# Patient Record
Sex: Female | Born: 1955 | Race: White | Hispanic: No | Marital: Married | Smoking: Former smoker
Health system: Southern US, Community
[De-identification: ages and names within clinical notes are randomized; demographics above are authoritative.]

## PROBLEM LIST (undated history)

## (undated) DIAGNOSIS — E785 Hyperlipidemia, unspecified: Secondary | ICD-10-CM

## (undated) DIAGNOSIS — I639 Cerebral infarction, unspecified: Secondary | ICD-10-CM

## (undated) DIAGNOSIS — I10 Essential (primary) hypertension: Secondary | ICD-10-CM

## (undated) DIAGNOSIS — F329 Major depressive disorder, single episode, unspecified: Secondary | ICD-10-CM

## (undated) DIAGNOSIS — F419 Anxiety disorder, unspecified: Secondary | ICD-10-CM

## (undated) DIAGNOSIS — E119 Type 2 diabetes mellitus without complications: Secondary | ICD-10-CM

## (undated) DIAGNOSIS — N3090 Cystitis, unspecified without hematuria: Secondary | ICD-10-CM

## (undated) DIAGNOSIS — G709 Myoneural disorder, unspecified: Secondary | ICD-10-CM

## (undated) DIAGNOSIS — K219 Gastro-esophageal reflux disease without esophagitis: Secondary | ICD-10-CM

## (undated) DIAGNOSIS — F32A Depression, unspecified: Secondary | ICD-10-CM

## (undated) HISTORY — PX: CARPAL TUNNEL RELEASE: SHX101

---

## 1997-10-28 ENCOUNTER — Encounter: Admission: RE | Admit: 1997-10-28 | Discharge: 1998-01-26 | Payer: Self-pay | Admitting: *Deleted

## 2004-08-19 ENCOUNTER — Ambulatory Visit (HOSPITAL_BASED_OUTPATIENT_CLINIC_OR_DEPARTMENT_OTHER): Admission: RE | Admit: 2004-08-19 | Discharge: 2004-08-19 | Payer: Self-pay | Admitting: Orthopedic Surgery

## 2004-08-19 ENCOUNTER — Ambulatory Visit (HOSPITAL_COMMUNITY): Admission: RE | Admit: 2004-08-19 | Discharge: 2004-08-19 | Payer: Self-pay | Admitting: Orthopedic Surgery

## 2005-06-02 ENCOUNTER — Ambulatory Visit: Payer: Self-pay | Admitting: Cardiology

## 2008-05-25 ENCOUNTER — Inpatient Hospital Stay (HOSPITAL_COMMUNITY): Admission: AD | Admit: 2008-05-25 | Discharge: 2008-05-27 | Payer: Self-pay | Admitting: Internal Medicine

## 2008-05-26 ENCOUNTER — Encounter (INDEPENDENT_AMBULATORY_CARE_PROVIDER_SITE_OTHER): Payer: Self-pay | Admitting: Internal Medicine

## 2010-10-18 NOTE — Discharge Summary (Signed)
NAME:  Chloe Owens, Chloe Owens NO.:  [id number]   MEDICAL RECORD NO.:  [id number]          PATIENT TYPE:  INP   LOCATION:  3002                         FACILITY:  [facility]   PHYSICIAN:  Renee Ramus, MD       DATE OF BIRTH:  Oct 28, 1955   DATE OF ADMISSION:  05/25/2008  DATE OF DISCHARGE:  05/27/2008                               DISCHARGE SUMMARY   PRIMARY DISCHARGE DIAGNOSIS:  Acute cerebrovascular accident.   SECONDARY DIAGNOSES:  1. Diabetes mellitus type 1.  2. Vitamin B12 deficiency mild.  3. Hypercholesterolemia.  4. Altered mental status.  5. History of anxiety.  6. Hormone replacement therapy.  7. Tobacco abuse.  8. Hypertension.   HOSPITAL COURSE BY PROBLEM:  1. Acute cerebrovascular accident.  The patient is a 55 year old      female who was admitted secondary to decreased mental status,      evidence of expressive aphasia, evidence of mild gait instability      and acute cerebrovascular accident found on MRI at outside      hospital.  The patient was transferred to our facility for further      evaluation and treatment.  The patient did receive carotid      ultrasound showing no significant stenoses.  She did have an MRA      done at an outside facility showing relatively narrow posterior      vertebral arteries and evidence of plaque formation without      significant stenosis in the carotids.  The patient has been started      on aspirin.  She has also received a statin therapy.  Her      lisinopril has been increased.  She has been counseled to quit      smoking and drinking and her psychotropic medications, i.e. Ativan      and alprazolam had been discontinued.  The patient has made already      indications of good strong recovery.  She will follow up with      Speech Therapy as an outpatient.  She did receive a Speech Therapy      consult while in-house and they believe that patient was      demonstrating expressive aphasia with word-finding deficits  and      some cognitive impairment.  I am hopeful that B12 supplementation,      further therapy and modified risk factors will lead to a successful      conclusion.  2. Diabetes mellitus type 1.  The patient did have an elevated      hemoglobin A1c at 9.2.  She is on an outpatient regimen of NPH and      lispro.  This will be continued post-discharge.  She has been under      200-300 range during her hospitalization and I believe that she      could improve on her efficacy of treatment but will defer this to      her primary care physician.  3. Hypercholesterolemia.  The patient did have elevated cholesterol  and has been started on statin therapy.  She should follow up with      her primary care physician within 1 month to check for liver      enzymes and possible negative sequelae from statin therapy.  4. History of anxiety.  The patient will be continued on her Effexor      and further treatment per primary care physician.  5. Hypertension.  We are increasing her lisinopril to 20 mg p.o.      daily.  The patient has been on the 120s to 160s systolic blood      pressure.  Her goal systolic blood pressure should be less than      120.  6. Hormone-replacement therapy.  We have discontinued her Premarin.  7. Tobacco use.  She has discontinued smoking and has not required      patch and has not requested one during this hospitalization.   LABORATORY DATA:  1. Mild hypoxemia with a PO2 of 76, 95% on FIO2 of 0.21.  2. Hemoglobin A1c of 9.8.  3. UA showing elevated specific gravity at 1.037 with glucosuria and      ketones but no evidence of infection.  4. Tox screen positive for marijuana.  5. Homocystine level of 11.3.  6. Total cholesterol of 266 with HDL of 47, triglycerides 292 with an      LDL of 161.   STUDIES:  1. Carotid ultrasound showing no significant hemodynamic stenoses.  2. Two-D echocardiogram which is pending.  3. MRI done from outside hospital showing thalamic  infarct and diffuse      cortical atrophy.  4. 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   MEDICATIONS ON DISCHARGE:  1. Lispro and NPH as per home regimen.  2. Lisinopril 20 mg p.o. daily.  3. Aspirin 81 mg p.o. daily.  4. Zocor 10 mg p.o. daily.  5. Venlafaxine 75 mg p.o. daily.  6. B12 1000 mcg p.o. daily.   There are no labs or studies pending with the exception of the  echocardiogram prior to discharge.  The patient is in stable condition  and anxious for discharge.   Time spent 35 minutes.      Renee Ramus, MD  Electronically Signed     JF/MEDQ  D:  05/27/2008  T:  05/27/2008  Job:  [id number]

## 2010-10-18 NOTE — H&P (Signed)
NAME:  Chloe Chloe Owens, Chloe Owens NO.:  [id number]   MEDICAL RECORD NO.:  [id number]          PATIENT TYPE:  INP   LOCATION:  3002                         FACILITY:  [facility]   PHYSICIAN:  Lonia Blood, M.D.DATE OF BIRTH:  February 21, 1956   DATE OF ADMISSION:  05/25/2008  DATE OF DISCHARGE:                              HISTORY & PHYSICAL   PRIMARY CARE PHYSICIAN:  Unassigned in [place].   CHIEF COMPLAINT:  Subacute CVA with altered mental status.   HISTORY OF PRESENT ILLNESS:  Ms. Chloe Chloe Owens Chloe Owens. Chloe Chloe Owens Chloe Owens is a 55 year old female  who lives in the [place] area.  She was admitted to [facility] on  May 25, 1999, for acute onset of severe confusion.  A CT scan of  the head at the time of her admission there was unrevealing.  After  being placed in the hospital an MRI was accomplished which revealed an  acute left thalamic CVA.  Other laboratory tests were evaluated and  ongoing medical treatment was being provided.  During the patient's  hospital stay her confusion seemed to worsen.  When informed that a  neurology service would not be available, the patient's family requested  that the patient be transferred to [facility].  I was then  contacted by the attending physician, at which time the family's request  with relayed to me.  I agreed to accept the patient in transfer.   At the time of my evaluation, Ms. Chloe Chloe Owens Chloe Owens is in a hospital bed on unit  3000.  She is awake, but she is clearly confused.  She asked the same  question over and over.  She is not able to tell me what year it is or  exactly where she is.  She has no understanding of the circumstances of  her transfer here and no awareness of her own sickness.  The patient's  husband and son are in the room.  They state that in retrospect they  have noted very subtle changes in the patient's mental status with  progressive confusion and occasional questions that did make sense over  about a 6-8 week period.  This has  not prevented her, however, from  continuing dysfunction as a Architectural technologist and has been very subtle  though admittedly progressive.  Nonetheless, there was a significant  rapid change yesterday morning.  The patient's son stated that she was  still in bed when he called to check on her.  She made no sense in the  conversation they carried out over the phone.  As noted, this was a  dramatic difference.  This prompted her initial evaluation at [facility].  Since that time, the patient has shown no significant signs of  improvement.   REVIEW OF SYSTEMS:  The patient herself cannot provide a review of  systems at the present time as she is significantly altered in her  mental status.   PAST MEDICAL HISTORY:  1. Diabetes mellitus.  2. Hypertension.  3. Hypercholesterolemia.  4. Anxiety disorder.  5. Status post carpal tunnel release on the left.  6. Tobacco abuse in the amount  of one pack per day since teen years.   OUTPATIENT MEDICATIONS:  1. Prempro daily.  2. Effexor 75 mg daily.  3. Ambien 10 mg nightly.  4. Prilosec 20 mg daily.  5. Xanax 1 mg daily.  6. Lisinopril - dose unclear.  7. Humulin - dose unclear.   ALLERGIES:  CODEINE.   FAMILY HISTORY:  Unable to be gleaned from the patient.   SOCIAL HISTORY:  The patient does not drink.  She does not use illicit  drugs.  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  She lives in the [place] area.   LABORATORY DATA:  (All labs for [facility]).  CMET is remarkable  for a serum glucose of 478, alk phos of 113 and an albumin 3.4 with a  total protein of 6.7.  White count is elevated at 11.0 with a normal  hemoglobin and normal platelet count.  B12 level is 254, which is the  extreme low end of normal with a normal folic acid.  Urinalysis reveals  greater than 1000 glucose, 15 of ketones and 5-10 white blood cells.  CT  scan of the head from May 24, 2008, reveals  mild diffuse cortical  atrophy.  The MRI of the head from May 25, 2008, reveals an acute  left thalamic CVA.   PHYSICAL EXAMINATION:  VITAL SIGNS:  Temperature 97.8, blood pressure  132/74, heart rate 93, respiratory 16.  O2 sats 95% on room air.  GENERAL:  A well-developed, well-nourished female in no acute  respiratory distress.  HEENT:  Normocephalic, atraumatic.  Pupils equal, round and reactive to  light and accommodation.  Extraocular muscles intact bilaterally.  OC/OP  clear.  NECK:  No JVD.  LUNGS:  Clear to auscultation bilaterally without wheezes or rhonchi.  CARDIOVASCULAR:  Regular rate and rhythm with a subtle 2/6 holosystolic  murmur without gallop or rub.  ABDOMEN:  Nontender, nondistended.  Soft.  Bowel sounds present.  No  organomegaly, no rebound, no ascites.  EXTREMITIES:  No significant cyanosis, clubbing or edema bilateral lower  extremities.  NEUROLOGIC:  The patient displays 5/5 strength in bilateral upper and  lower extremities.  There is no Babinski.  She has intact sensation to  touch throughout.  Cranial nerves II through XII appear to be intact  bilaterally.  The patient is, however, significantly confused.  As noted  above, she is alert to place, but not year or situation.  She continues  to ask the same question over-and-over within a 10-minute period.   IMPRESSION AND PLAN:  1. Severe altered mental status - there appears to be both an acute      and a subacute/chronic component to this altered mental status.  As      noted above, this has been a very subtle issue previously which      rapidly progressed on May 24, 2008.  It is my hope that what      we are seeing is a consequence of a significant B12 deficiency.  A      B12 level of 254 certainly could cause significant clinical      symptoms.  I will send urine for a methylmalonic acid assay to help      confirm my suspicions.  Regardless, we will initiate B12      supplementation at 1000  mcg subcu daily and hope to see significant      improvement.  There is likely a multifactorial component hear as      well given the patient's pyelonephritis, as well as her thalamic      cerebrovascular accident.  I have warned the family that though I      am hopeful that B12 will help, but it may not be the answer to all      of our problems.  We will treat the pyelonephritis and send urine      for culture.  We will complete the workup for her thalamic      cerebrovascular accident.  2. Acute left thalamic cerebrovascular accident - at the present time      the patient is days away from the onset of her symptoms and is      therefore not a candidate for aggressive acute intervention.  We      will, however, complete a full workup as the patient has not yet      had Doppler studies nor an MRA.  It that is not clear whether or      not the patient was taking an aspirin every day, I will empirically      add Plavix to her regimen for the present time.  A neurology      consultation will be considered based upon the results of our      pending testing.  3. Pyelonephritis - the patient has an elevated white count,      significant altered mental status and a positive urinalysis.  She      was recently treated in the outpatient setting with ciprofloxacin.      It is quite possible this patient has a Cipro resistant offending      bacterial pathogen.  I will therefore treat the patient with      Rocephin and we will send urine for culture for further evaluation.  4. Diabetes mellitus - we will place the patient on sliding scale      insulin and follow her CBGs very closely.  5. Hypertension - we will continue lisinopril and follow her blood      pressure trend closely.  6. Hypercholesterolemia - we will check a fasting lipid panel in the      morning and consider initiation of Lipitor as indicated.  Liver      function tests have been confirmed to be normal.  7. Anxiety disorder - we must  consider the possibility of a      benzodiazepine withdrawal given the fact the patient is chronically      on Xanax.  It sounds, however, the patient has been consistently      taking her Xanax and that she does not abuse it a dose higher than      that prescribed.  For now, we will half her dose and provide it on      a b.i.d. basis to avoid further withdrawal symptoms.  We will      follow her closely.  8. Tobacco abuse - at the present time, it would be fruitless to      educate the patient on the need for discontinuation of tobacco      abuse.  We will follow her clinically and hope that her mental      status improves at which time we will address this with her.      Lonia Blood, M.D.  Electronically Signed     JTM/MEDQ  D:  05/25/2008  T:  05/25/2008  Job:  [id number]

## 2010-10-21 NOTE — Op Note (Signed)
NAME:  Chloe Owens, Chloe Owens                 ACCOUNT NO.:  [id number]   MEDICAL RECORD NO.:  [id number]          PATIENT TYPE:  AMB   LOCATION:  DSC                          FACILITY:  [facility]   PHYSICIAN:  Katy Fitch. Sypher Montez Hageman., M.D.DATE OF BIRTH:  [birth year]   DATE OF PROCEDURE:  08/19/2004  DATE OF DISCHARGE:                                 OPERATIVE REPORT   PREOPERATIVE DIAGNOSIS:  Chronic entrapped neuropathy, median nerve right  carpal tunnel, and left carpal tunnel.   POSTOPERATIVE DIAGNOSIS:  Chronic entrapped neuropathy, median nerve right  carpal tunnel, and left carpal tunnel.   OPERATION:  Release of right transverse carpal ligament and injection of  left ulnar bursa.   OPERATING SURGEON:  Katy Fitch. Sypher, M.D.   ASSESSMENT:  Marveen Reeks. Dasnoit, P.A.-C.   ANESTHESIA:  General by LMA.   SUPERVISING ANESTHESIOLOGIST:  Janetta Hora. Gelene Mink, M.D.   INDICATIONS:  Chloe Owens is a 55 year old woman referred for evaluation and  management of bilateral hand numbness and discomfort. She has insulin-  dependent diabetes and background coronary artery disease.   Clinical examination confirmed signs of median neuropathy and elective  diagnostic studies revealed bilateral median neuropathy at the level of the  transverse carpal ligament.   After informed consent, we have advised proceeding with release of the right  transverse carpal ligament at this time.   Preoperative lab revealed an abnormal EKG. She has been thoroughly evaluated  by her medical physicians and cleared for surgery. She has had a stress  Cardiolite study performed prior that revealed an ejection fraction of 71%.   After informed consent, she is brought to the operating at this time.   PROCEDURE:  Shelbee Apgar is brought to the operating room and placed in the  supine position on the operating table. Following induction of general  anesthesia by LMA under the direct supervision Dr. Gelene Mink, the right arm  was  prepped with Betadine soap solution and sterilely draped.   Following exsanguination of the right arm with an Esmarch bandage, an  arterial tourniquet on the proximal brachium was inflated to 220 mmHg. This  procedure commenced with a short incision in the line of the ring finger in  the palm.  Subcutaneous tissues were carefully divided revealing the palmar  fascia. This was split in line of its fibers to reveal the common sensory  branch of the median nerve and superficial palmar arch. The median nerve was  gently isolated from the transverse carpal ligament with a Penfield 4  elevator followed by release of the transverse carpal ligament along its  ulnar border with scissors extending into the distal forearm.   This widely opened the carpal canal. No masses or other predicaments were  noted.   Bleeding points along the margin of the released ligament were  electrocauterized with bipolar current followed by repair of the skin with  intradermal 3-0 Prolene.   Attention was then directed to the left wrist. After routine Betadine prep,  a mixture of Depo-Medrol 40 mg/ml and 1% lidocaine was infiltrated into the  ulnar bursa taking care to  avoid the position of the median nerve.   This was dressed with a Band-Aid.   The right hand was dressed with Xeroflo, sterile gauze, and a volar plaster  splint maintaining 5 degrees of dorsiflexion.   Note, for aftercare, she is given a prescription for Vicodin 5 mg one p.o.  q.4-6h. pain, #20 tablets without refill.   She will return to our office for follow-up in one week or sooner if any  problems.      RVS/MEDQ  D:  08/19/2004  T:  08/19/2004  Job:  [id number]

## 2011-03-10 LAB — BLOOD GAS, ARTERIAL
Acid-Base Excess: 0.2 mmol/L (ref 0.0–2.0)
Bicarbonate: 24.3 mEq/L — ABNORMAL HIGH (ref 20.0–24.0)
Drawn by: 249101
FIO2: 0.21 %
O2 Saturation: 95.6 %
Patient temperature: 98.6
TCO2: 25.6 mmol/L (ref 0–100)
pCO2 arterial: 39.4 mmHg (ref 35.0–45.0)
pH, Arterial: 7.408 — ABNORMAL HIGH (ref 7.350–7.400)
pO2, Arterial: 76 mmHg — ABNORMAL LOW (ref 80.0–100.0)

## 2011-03-10 LAB — URINE CULTURE
Colony Count: 80000
Special Requests: NEGATIVE

## 2011-03-10 LAB — METHYLMALONIC ACID(MMA), RND URINE
Creatinine, Urine mg/dL-MMAURN: 99 mg/dL
Methylmalonic Acid, Random Urine: 1.1 mmol/mol CRT (ref 0.0–3.6)
Methylmalonic Acid, Ur: 9.6 umol/L

## 2011-03-10 LAB — URINE DRUGS OF ABUSE SCREEN W ALC, ROUTINE (REF LAB)
Amphetamine Screen, Ur: NEGATIVE
Barbiturate Quant, Ur: NEGATIVE
Benzodiazepines.: NEGATIVE
Cocaine Metabolites: NEGATIVE
Creatinine,U: 105 mg/dL
Ethyl Alcohol: <10 mg/dL (ref <10)
Marijuana Metabolite: POSITIVE — AB
Methadone: NEGATIVE
Opiate Screen, Urine: NEGATIVE
Phencyclidine (PCP): NEGATIVE
Propoxyphene: NEGATIVE

## 2011-03-10 LAB — PROTIME-INR
INR: 1 (ref 0.00–1.49)
Prothrombin Time: 13.1 seconds (ref 11.6–15.2)

## 2011-03-10 LAB — LIPID PANEL
Cholesterol: 266 mg/dL — ABNORMAL HIGH (ref 0–200)
HDL: 47 mg/dL (ref >39)
LDL Cholesterol: 161 mg/dL — ABNORMAL HIGH (ref 0–99)
Total CHOL/HDL Ratio: 5.7 RATIO
Triglycerides: 292 mg/dL — ABNORMAL HIGH (ref <150)
VLDL: 58 mg/dL — ABNORMAL HIGH (ref 0–40)

## 2011-03-10 LAB — URINE MICROSCOPIC-ADD ON

## 2011-03-10 LAB — URINALYSIS, ROUTINE W REFLEX MICROSCOPIC
Glucose, UA: >1000 mg/dL — AB
Hgb urine dipstick: NEGATIVE
Ketones, ur: >80 mg/dL — AB
Leukocytes, UA: NEGATIVE
Nitrite: NEGATIVE
Protein, ur: NEGATIVE mg/dL
Specific Gravity, Urine: 1.037 — ABNORMAL HIGH (ref 1.005–1.030)
Urobilinogen, UA: 0.2 mg/dL (ref 0.0–1.0)
pH: 5.5 (ref 5.0–8.0)

## 2011-03-10 LAB — GLUCOSE, CAPILLARY
Glucose-Capillary: 188 mg/dL — ABNORMAL HIGH (ref 70–99)
Glucose-Capillary: 192 mg/dL — ABNORMAL HIGH (ref 70–99)
Glucose-Capillary: 226 mg/dL — ABNORMAL HIGH (ref 70–99)
Glucose-Capillary: 230 mg/dL — ABNORMAL HIGH (ref 70–99)
Glucose-Capillary: 294 mg/dL — ABNORMAL HIGH (ref 70–99)
Glucose-Capillary: 337 mg/dL — ABNORMAL HIGH (ref 70–99)
Glucose-Capillary: 387 mg/dL — ABNORMAL HIGH (ref 70–99)
Glucose-Capillary: 487 mg/dL — ABNORMAL HIGH (ref 70–99)

## 2011-03-10 LAB — HEMOGLOBIN A1C
Hgb A1c MFr Bld: 9.8 % — ABNORMAL HIGH (ref 4.6–6.1)
Mean Plasma Glucose: 235 mg/dL

## 2011-03-10 LAB — COMPREHENSIVE METABOLIC PANEL
ALT: 11 U/L (ref 0–35)
AST: 14 U/L (ref 0–37)
Albumin: 2.8 g/dL — ABNORMAL LOW (ref 3.5–5.2)
Alkaline Phosphatase: 89 U/L (ref 39–117)
BUN: 14 mg/dL (ref 6–23)
CO2: 28 mEq/L (ref 19–32)
Calcium: 8.6 mg/dL (ref 8.4–10.5)
Chloride: 105 mEq/L (ref 96–112)
Creatinine, Ser: 0.7 mg/dL (ref 0.4–1.2)
GFR calc Af Amer: >60 mL/min (ref >60)
GFR calc non Af Amer: >60 mL/min (ref >60)
Glucose, Bld: 230 mg/dL — ABNORMAL HIGH (ref 70–99)
Potassium: 3.6 mEq/L (ref 3.5–5.1)
Sodium: 140 mEq/L (ref 135–145)
Total Bilirubin: 0.9 mg/dL (ref 0.3–1.2)
Total Protein: 5.7 g/dL — ABNORMAL LOW (ref 6.0–8.3)

## 2011-03-10 LAB — CBC
HCT: 37.6 % (ref 36.0–46.0)
Hemoglobin: 12.7 g/dL (ref 12.0–15.0)
MCHC: 33.7 g/dL (ref 30.0–36.0)
MCV: 88.5 fL (ref 78.0–100.0)
Platelets: 319 10*3/uL (ref 150–400)
RBC: 4.25 MIL/uL (ref 3.87–5.11)
RDW: 13.6 % (ref 11.5–15.5)
WBC: 7.4 10*3/uL (ref 4.0–10.5)

## 2011-03-10 LAB — CARDIAC PANEL(CRET KIN+CKTOT+MB+TROPI)
CK, MB: 1 ng/mL (ref 0.3–4.0)
Relative Index: INVALID (ref 0.0–2.5)
Total CK: 32 U/L (ref 7–177)
Troponin I: 0.01 ng/mL (ref 0.00–0.06)

## 2011-03-10 LAB — APTT: aPTT: 24 seconds (ref 24–37)

## 2011-03-10 LAB — TSH: TSH: 0.416 u[IU]/mL (ref 0.350–4.500)

## 2011-03-10 LAB — VITAMIN B12: Vitamin B-12: >2000 pg/mL — ABNORMAL HIGH (ref 211–911)

## 2011-03-10 LAB — PREGNANCY, URINE: Preg Test, Ur: NEGATIVE

## 2011-03-10 LAB — FOLATE: Folate: 7.4 ng/mL

## 2011-03-10 LAB — THC (MARIJUANA), URINE, CONFIRMATION: Marijuana, Ur-Confirmation: 80 ng/mL

## 2011-03-10 LAB — RPR: RPR Ser Ql: NONREACTIVE

## 2011-03-10 LAB — HOMOCYSTEINE: Homocysteine: 11.3 umol/L (ref 4.0–15.4)

## 2011-10-12 DIAGNOSIS — Z1231 Encounter for screening mammogram for malignant neoplasm of breast: Secondary | ICD-10-CM | POA: Diagnosis not present

## 2011-11-13 DIAGNOSIS — B850 Pediculosis due to Pediculus humanus capitis: Secondary | ICD-10-CM | POA: Diagnosis not present

## 2012-04-14 ENCOUNTER — Emergency Department (HOSPITAL_COMMUNITY): Payer: BC Managed Care – PPO

## 2012-04-14 ENCOUNTER — Encounter (HOSPITAL_COMMUNITY): Payer: Self-pay

## 2012-04-14 ENCOUNTER — Observation Stay (HOSPITAL_COMMUNITY)
Admission: EM | Admit: 2012-04-14 | Discharge: 2012-04-17 | DRG: 253 | Disposition: A | Discharge status: Skilled Nursing Facility | Payer: BC Managed Care – PPO | Attending: Emergency Medicine | Admitting: Emergency Medicine

## 2012-04-14 DIAGNOSIS — S82892A Other fracture of left lower leg, initial encounter for closed fracture: Secondary | ICD-10-CM | POA: Diagnosis present

## 2012-04-14 DIAGNOSIS — E785 Hyperlipidemia, unspecified: Secondary | ICD-10-CM | POA: Diagnosis present

## 2012-04-14 DIAGNOSIS — S82843A Displaced bimalleolar fracture of unspecified lower leg, initial encounter for closed fracture: Secondary | ICD-10-CM | POA: Diagnosis not present

## 2012-04-14 DIAGNOSIS — S99919A Unspecified injury of unspecified ankle, initial encounter: Secondary | ICD-10-CM | POA: Diagnosis not present

## 2012-04-14 DIAGNOSIS — S8990XA Unspecified injury of unspecified lower leg, initial encounter: Secondary | ICD-10-CM | POA: Diagnosis not present

## 2012-04-14 DIAGNOSIS — Z7982 Long term (current) use of aspirin: Secondary | ICD-10-CM | POA: Diagnosis not present

## 2012-04-14 DIAGNOSIS — Z794 Long term (current) use of insulin: Secondary | ICD-10-CM | POA: Insufficient documentation

## 2012-04-14 DIAGNOSIS — E162 Hypoglycemia, unspecified: Secondary | ICD-10-CM | POA: Diagnosis not present

## 2012-04-14 DIAGNOSIS — E119 Type 2 diabetes mellitus without complications: Secondary | ICD-10-CM | POA: Diagnosis present

## 2012-04-14 DIAGNOSIS — S99929A Unspecified injury of unspecified foot, initial encounter: Secondary | ICD-10-CM | POA: Diagnosis not present

## 2012-04-14 DIAGNOSIS — I10 Essential (primary) hypertension: Secondary | ICD-10-CM | POA: Insufficient documentation

## 2012-04-14 DIAGNOSIS — S82899A Other fracture of unspecified lower leg, initial encounter for closed fracture: Secondary | ICD-10-CM

## 2012-04-14 DIAGNOSIS — I959 Hypotension, unspecified: Secondary | ICD-10-CM | POA: Diagnosis not present

## 2012-04-14 DIAGNOSIS — F17200 Nicotine dependence, unspecified, uncomplicated: Secondary | ICD-10-CM | POA: Diagnosis present

## 2012-04-14 DIAGNOSIS — Z880 Allergy status to penicillin: Secondary | ICD-10-CM | POA: Diagnosis not present

## 2012-04-14 DIAGNOSIS — S9306XA Dislocation of unspecified ankle joint, initial encounter: Secondary | ICD-10-CM | POA: Diagnosis not present

## 2012-04-14 DIAGNOSIS — Y92009 Unspecified place in unspecified non-institutional (private) residence as the place of occurrence of the external cause: Secondary | ICD-10-CM | POA: Insufficient documentation

## 2012-04-14 DIAGNOSIS — E1169 Type 2 diabetes mellitus with other specified complication: Secondary | ICD-10-CM | POA: Insufficient documentation

## 2012-04-14 DIAGNOSIS — Z8673 Personal history of transient ischemic attack (TIA), and cerebral infarction without residual deficits: Secondary | ICD-10-CM | POA: Diagnosis not present

## 2012-04-14 DIAGNOSIS — Z01818 Encounter for other preprocedural examination: Secondary | ICD-10-CM | POA: Diagnosis not present

## 2012-04-14 DIAGNOSIS — W010XXA Fall on same level from slipping, tripping and stumbling without subsequent striking against object, initial encounter: Secondary | ICD-10-CM | POA: Insufficient documentation

## 2012-04-14 DIAGNOSIS — Z79899 Other long term (current) drug therapy: Secondary | ICD-10-CM | POA: Diagnosis not present

## 2012-04-14 DIAGNOSIS — Z4789 Encounter for other orthopedic aftercare: Secondary | ICD-10-CM | POA: Diagnosis not present

## 2012-04-14 HISTORY — DX: Hyperlipidemia, unspecified: E78.5

## 2012-04-14 HISTORY — DX: Type 2 diabetes mellitus without complications: E11.9

## 2012-04-14 HISTORY — DX: Cerebral infarction, unspecified: I63.9

## 2012-04-14 HISTORY — DX: Essential (primary) hypertension: I10

## 2012-04-14 LAB — COMPREHENSIVE METABOLIC PANEL
ALT: 6 U/L (ref 0–35)
AST: 13 U/L (ref 0–37)
Albumin: 2.9 g/dL — ABNORMAL LOW (ref 3.5–5.2)
Alkaline Phosphatase: 53 U/L (ref 39–117)
BUN: 24 mg/dL — ABNORMAL HIGH (ref 6–23)
CO2: 32 mEq/L (ref 19–32)
Calcium: 8.6 mg/dL (ref 8.4–10.5)
Chloride: 102 mEq/L (ref 96–112)
Creatinine, Ser: 0.97 mg/dL (ref 0.50–1.10)
GFR calc Af Amer: 73 mL/min — ABNORMAL LOW (ref >90)
GFR calc non Af Amer: 63 mL/min — ABNORMAL LOW (ref >90)
Glucose, Bld: 65 mg/dL — ABNORMAL LOW (ref 70–99)
Potassium: 3.9 mEq/L (ref 3.5–5.1)
Sodium: 140 mEq/L (ref 135–145)
Total Bilirubin: 0.2 mg/dL — ABNORMAL LOW (ref 0.3–1.2)
Total Protein: 6.3 g/dL (ref 6.0–8.3)

## 2012-04-14 LAB — CBC WITH DIFFERENTIAL/PLATELET
Band Neutrophils: 0 % (ref 0–10)
Basophils Absolute: 0 10*3/uL (ref 0.0–0.1)
Basophils Relative: 0 % (ref 0–1)
Blasts: 0 %
Eosinophils Absolute: 0.3 10*3/uL (ref 0.0–0.7)
Eosinophils Relative: 2 % (ref 0–5)
HCT: 35.3 % — ABNORMAL LOW (ref 36.0–46.0)
Hemoglobin: 12.2 g/dL (ref 12.0–15.0)
Lymphocytes Relative: 40 % (ref 12–46)
Lymphs Abs: 5 10*3/uL — ABNORMAL HIGH (ref 0.7–4.0)
MCH: 30.7 pg (ref 26.0–34.0)
MCHC: 34.6 g/dL (ref 30.0–36.0)
MCV: 88.7 fL (ref 78.0–100.0)
Metamyelocytes Relative: 0 %
Monocytes Absolute: 0.6 10*3/uL (ref 0.1–1.0)
Monocytes Relative: 5 % (ref 3–12)
Myelocytes: 0 %
Neutro Abs: 6.7 10*3/uL (ref 1.7–7.7)
Neutrophils Relative %: 53 % (ref 43–77)
Platelets: 189 10*3/uL (ref 150–400)
Promyelocytes Absolute: 0 %
RBC: 3.98 MIL/uL (ref 3.87–5.11)
RDW: 13.2 % (ref 11.5–15.5)
WBC: 12.6 10*3/uL — ABNORMAL HIGH (ref 4.0–10.5)
nRBC: 0 /100 WBC

## 2012-04-14 LAB — URINALYSIS, ROUTINE W REFLEX MICROSCOPIC
Bilirubin Urine: NEGATIVE
Glucose, UA: 250 mg/dL — AB
Hgb urine dipstick: NEGATIVE
Ketones, ur: NEGATIVE mg/dL
Leukocytes, UA: NEGATIVE
Nitrite: NEGATIVE
Protein, ur: NEGATIVE mg/dL
Specific Gravity, Urine: 1.018 (ref 1.005–1.030)
Urobilinogen, UA: 1 mg/dL (ref 0.0–1.0)
pH: 5.5 (ref 5.0–8.0)

## 2012-04-14 LAB — GLUCOSE, CAPILLARY
Glucose-Capillary: 131 mg/dL — ABNORMAL HIGH (ref 70–99)
Glucose-Capillary: 57 mg/dL — ABNORMAL LOW (ref 70–99)
Glucose-Capillary: 154 mg/dL — ABNORMAL HIGH (ref 70–99)
Glucose-Capillary: 48 mg/dL — ABNORMAL LOW (ref 70–99)
Glucose-Capillary: 153 mg/dL — ABNORMAL HIGH (ref 70–99)
Glucose-Capillary: 81 mg/dL (ref 70–99)

## 2012-04-14 LAB — LACTIC ACID, PLASMA: Lactic Acid, Venous: 1.4 mmol/L (ref 0.5–2.2)

## 2012-04-14 MED ORDER — ONDANSETRON 8 MG PO TBDP
8.0000 mg | ORAL_TABLET | Freq: Once | ORAL | Status: AC
  Administered 2012-04-14: 8 mg via ORAL
  Filled 2012-04-14 (×2): qty 1

## 2012-04-14 MED ORDER — DEXTROSE 50 % IV SOLN
1.0000 | Freq: Once | INTRAVENOUS | Status: AC
  Administered 2012-04-14: 50 mL via INTRAVENOUS

## 2012-04-14 MED ORDER — SIMVASTATIN 40 MG PO TABS
40.0000 mg | ORAL_TABLET | Freq: Every evening | ORAL | Status: DC
  Administered 2012-04-15 – 2012-04-16 (×2): 40 mg via ORAL
  Filled 2012-04-14 (×5): qty 1

## 2012-04-14 MED ORDER — MIDAZOLAM HCL 2 MG/2ML IJ SOLN
2.0000 mg | Freq: Once | INTRAMUSCULAR | Status: AC
  Administered 2012-04-14: 2 mg via INTRAVENOUS
  Filled 2012-04-14: qty 2

## 2012-04-14 MED ORDER — HYDROMORPHONE HCL PF 1 MG/ML IJ SOLN
1.0000 mg | Freq: Once | INTRAMUSCULAR | Status: AC
  Administered 2012-04-14: 1 mg via INTRAVENOUS
  Filled 2012-04-14: qty 1

## 2012-04-14 MED ORDER — HYDROMORPHONE HCL PF 1 MG/ML IJ SOLN
INTRAMUSCULAR | Status: AC
  Filled 2012-04-14: qty 1

## 2012-04-14 MED ORDER — HYDROMORPHONE HCL PF 1 MG/ML IJ SOLN: 1.0000 mg | Freq: Once | INTRAMUSCULAR | Status: DC

## 2012-04-14 MED ORDER — PROPOFOL 10 MG/ML IV EMUL
INTRAVENOUS | Status: AC
  Administered 2012-04-14: 15:00:00
  Filled 2012-04-14: qty 100

## 2012-04-14 MED ORDER — INSULIN GLARGINE 100 UNIT/ML ~~LOC~~ SOLN: 40.0000 [IU] | Freq: Every day | SUBCUTANEOUS | Status: DC

## 2012-04-14 MED ORDER — SODIUM CHLORIDE 0.9 % IV BOLUS (SEPSIS)
1000.0000 mL | Freq: Once | INTRAVENOUS | Status: AC
  Administered 2012-04-14: 1000 mL via INTRAVENOUS

## 2012-04-14 MED ORDER — INSULIN GLARGINE 100 UNIT/ML ~~LOC~~ SOLN: 20.0000 [IU] | Freq: Every day | SUBCUTANEOUS | Status: DC

## 2012-04-14 MED ORDER — DEXTROSE 50 % IV SOLN
INTRAVENOUS | Status: AC
  Administered 2012-04-14: 50 mL
  Filled 2012-04-14: qty 50

## 2012-04-14 MED ORDER — FENTANYL CITRATE 0.05 MG/ML IJ SOLN
50.0000 ug | Freq: Once | INTRAMUSCULAR | Status: AC
  Administered 2012-04-14: 50 ug via INTRAMUSCULAR
  Filled 2012-04-14: qty 2

## 2012-04-14 MED ORDER — INSULIN ASPART 100 UNIT/ML ~~LOC~~ SOLN: 0.0000 [IU] | SUBCUTANEOUS | Status: DC

## 2012-04-14 MED ORDER — DIVALPROEX SODIUM ER 500 MG PO TB24
500.0000 mg | ORAL_TABLET | Freq: Two times a day (BID) | ORAL | Status: DC
  Administered 2012-04-15 – 2012-04-17 (×6): 500 mg via ORAL
  Filled 2012-04-14 (×8): qty 1

## 2012-04-14 MED ORDER — ONDANSETRON HCL 4 MG/2ML IJ SOLN: 4.0000 mg | Freq: Three times a day (TID) | INTRAMUSCULAR | Status: AC | PRN

## 2012-04-14 MED ORDER — HYDROMORPHONE HCL PF 1 MG/ML IJ SOLN
INTRAMUSCULAR | Status: AC | PRN
  Administered 2012-04-14: 1 mg

## 2012-04-14 MED ORDER — DEXTROSE 50 % IV SOLN
INTRAVENOUS | Status: AC
  Administered 2012-04-14: 50 mL via INTRAVENOUS
  Filled 2012-04-14: qty 50

## 2012-04-14 MED ORDER — SODIUM CHLORIDE 0.9 % IV SOLN
INTRAVENOUS | Status: DC
  Administered 2012-04-14: 14:00:00 via INTRAVENOUS

## 2012-04-14 MED ORDER — DEXTROSE-NACL 5-0.9 % IV SOLN
INTRAVENOUS | Status: DC
  Administered 2012-04-15: 11:00:00 via INTRAVENOUS

## 2012-04-14 MED ORDER — ALPRAZOLAM 0.5 MG PO TABS
0.5000 mg | ORAL_TABLET | Freq: Two times a day (BID) | ORAL | Status: DC
  Administered 2012-04-15 – 2012-04-17 (×6): 0.5 mg via ORAL
  Filled 2012-04-14 (×6): qty 1

## 2012-04-14 MED ORDER — MORPHINE SULFATE 4 MG/ML IJ SOLN
4.0000 mg | INTRAMUSCULAR | Status: DC | PRN
  Administered 2012-04-14 – 2012-04-15 (×3): 4 mg via INTRAVENOUS
  Filled 2012-04-14 (×3): qty 1

## 2012-04-14 MED ORDER — PANTOPRAZOLE SODIUM 40 MG PO TBEC
40.0000 mg | DELAYED_RELEASE_TABLET | Freq: Every day | ORAL | Status: DC
Start: 2012-04-14 — End: 2012-04-17
  Administered 2012-04-15 – 2012-04-16 (×3): 40 mg via ORAL
  Filled 2012-04-14 (×3): qty 1

## 2012-04-14 NOTE — Progress Notes (Signed)
Orthopedic Tech Progress Note Patient Details:  Chloe Owens [facility] 1956-05-01 [id number]  Ortho Devices Type of Ortho Device: Stirrup splint Ortho Device/Splint Location: left leg Ortho Device/Splint Interventions: Application   Demontrae Gilbert 04/14/2012, 8:22 PM

## 2012-04-14 NOTE — Consult Note (Signed)
[facility] Medical Consultation  SHASHANA FULLINGTON ZOX:[id number] DOB: [birth year] DOA: 04/14/2012 PCP: No primary provider on file.   Requesting physician: Dr. Francena Hanly Date of consultation: 04/14/2012 Reason for consultation: Medical Management   Impression/Recommendations Principal Problem:  *Ankle fracture, left Active Problems:  Fall due to stumbling  Hypoglycemia  Hypotension  Diabetes mellitus  Hyperlipidemia  History of CVA (cerebrovascular accident)   Plan:    1.  Left Ankle Fx:  Ortho: Dr. Kirtland Bouchard.Supple Plan for surgery on 04/16/2011 2.  Hypoglycemia-Improved, Continue Diet until NPO for Surgery tomorrow's Lantus dosage reduced, SSI PRN.   3.  Hypotension- Hold BP Meds, IVFs for Fluid Resuscitation, Monitor Bps 4.  DM2-  Half Dose Lantus in AM, then reinstate Full does on 04/16/2012 if taking POs. 5.  Hyperlipidemia- continue Simvastatin 6.  CVA hx -stable 7.  Other- Request Medication Reconciliation per pharmacy at 9:10 pm, due to discrepancies of home medications listed.     Thank you for this consultation, [facility] will follow this patient while she is hospitalized.    Chief Complaint: Fall with Left Ankle Fracture  HPI: Chloe Owens is an 56 y.o. Female who tripped and fell turning her ankle this afternoon and was taken to [facility] ED.  She had X-rays which revealed a Fracture of the left Ankle and arrangements were made to transfer her to [facility] for Orthopedic Care under the services of Dr. Francena Hanly.  On arrival to [facility], the patient was found to be hypotensive and hypoglycemic so a Medicine Consultation was requested.  IVFs were administered and her blood pressures were responding, and she was given IV dextrose and had improvement in her blood sugar.  The hypotension was most likely due to the pain medications she had received, and her hypoglycemia was most likely due to being NPO and on Lantus therapy while she has been  awaiting possible eminent surgery.  Patient has been seen and evaluated by Ortho and has been placed in a splint /cast for now, and is to have surgery on 04/15/2012 at approximately 2:30 pm.  She will be able to eat now and will be NPO 8 hour befor surgery.     Review of Systems: The patient denies anorexia, fever, weight loss, vision loss, decreased hearing, hoarseness, chest pain, syncope, dyspnea on exertion, peripheral edema, balance deficits, hemoptysis, abdominal pain, melena, hematochezia, severe indigestion/heartburn, hematuria, incontinence, genital sores, muscle weakness, suspicious skin lesions, transient blindness, difficulty walking, depression, unusual weight change, abnormal bleeding, enlarged lymph nodes, angioedema, and breast masses.      Past Medical History  Diagnosis Date  . Diabetes mellitus without complication   . Hypertension   . Stroke     05/2008  . Hyperlipidemia     History reviewed. No pertinent past surgical history.    Prior to Admission medications   Medication Sig Start Date End Date Taking? Authorizing Provider  ALPRAZolam Prudy Feeler) 0.5 MG tablet Take 0.5 mg by mouth 2 (two) times daily.   Yes Historical Provider, MD  aspirin EC 81 MG tablet Take 81 mg by mouth daily.   Yes Historical Provider, MD  divalproex (DEPAKOTE ER) 500 MG 24 hr tablet Take 2,000 mg by mouth daily.    Yes Historical Provider, MD  ibuprofen (ADVIL,MOTRIN) 200 MG tablet Take 200 mg by mouth every 6 (six) hours as needed. Pain.   Yes Historical Provider, MD  insulin glargine (LANTUS) 100 UNIT/ML injection Inject 40 Units into the skin daily.  Yes Historical Provider, MD  lisinopril-hydrochlorothiazide (PRINZIDE,ZESTORETIC) 20-12.5 MG per tablet Take 1 tablet by mouth daily.   Yes Historical Provider, MD  simvastatin (ZOCOR) 40 MG tablet Take 40 mg by mouth every evening.   Yes Historical Provider, MD  traZODone (DESYREL) 50 MG tablet Take 100 mg by mouth at bedtime.   Yes Historical  Provider, MD     Allergies  Allergen Reactions  . Penicillins Rash    Social History:  reports that she has been smoking.  She does not have any smokeless tobacco history on file. She reports that she does not drink alcohol or use illicit drugs.   Family History  Problem Relation Age of Onset  . CAD Father   . CAD Mother   . Cancer - Other Mother     Breast  . Cancer - Other Brother     Unknown type     Physical Exam:  GEN:  Pleasant Obese  Anxious 56 year old Female examined  and in no acute distress; cooperative with exam Filed Vitals:   04/14/12 1915 04/14/12 1930 04/14/12 1945 04/14/12 2000  BP: 91/64 113/58 124/64 139/68  Pulse: 84 80 81 96  Temp:      TempSrc:      Resp:      Height:      Weight:      SpO2: 97% 96% 95% 97%   Blood pressure 139/68, pulse 96, temperature 97.9 F (36.6 C), temperature source Oral, resp. rate 16, height 5\' 8"  (1.727 m), weight 77.111 kg (170 lb), SpO2 97.00%. PSYCH: She is alert and oriented x4; Appears anxious does not appear depressed;  HEENT: Normocephalic and Atraumatic, Mucous membranes pink; PERRLA; EOM intact; Fundi:  Benign;  No scleral icterus, Nares: Patent, Oropharynx: Clear, Fair Dentition, Neck:  FROM, no cervical lymphadenopathy nor thyromegaly or carotid bruit; no JVD; Breasts:: Not examined CHEST WALL: No tenderness CHEST: Normal respiration, clear to auscultation bilaterally HEART: Regular rate and rhythm; no murmurs rubs or gallops BACK: No kyphosis or scoliosis; no CVA tenderness ABDOMEN: Positive Bowel Sounds, Obese, soft non-tender; no masses, no organomegaly. Rectal Exam: Not done EXTREMITIES: No bone or joint deformity; age-appropriate arthropathy of the hands and knees; no cyanosis, clubbing or edema; no ulcerations. Genitalia: not examined PULSES: 2+ and symmetric SKIN: Normal hydration no rash or ulceration CNS: Cranial nerves 2-12 grossly intact no focal neurologic deficit    Labs on Admission:    Basic Metabolic Panel:  Lab 04/14/12 1610  NA 140  K 3.9  CL 102  CO2 32  GLUCOSE 65*  BUN 24*  CREATININE 0.97  CALCIUM 8.6  MG --  PHOS --   Liver Function Tests:  Lab 04/14/12 1623  AST 13  ALT 6  ALKPHOS 53  BILITOT 0.2*  PROT 6.3  ALBUMIN 2.9*   No results found for this basename: LIPASE:5,AMYLASE:5 in the last 168 hours No results found for this basename: AMMONIA:5 in the last 168 hours CBC:  Lab 04/14/12 1623  WBC 12.6*  NEUTROABS 6.7  HGB 12.2  HCT 35.3*  MCV 88.7  PLT 189   Cardiac Enzymes: No results found for this basename: CKTOTAL:5,CKMB:5,CKMBINDEX:5,TROPONINI:5 in the last 168 hours BNP: No components found with this basename: POCBNP:5 CBG:  Lab 04/14/12 1832 04/14/12 1740 04/14/12 1447 04/14/12 1315 04/14/12 1145  GLUCAP 153* 48* 154* 57* 131*    Radiological Exams on Admission: Dg Ankle Complete Left  04/14/2012  *RADIOLOGY REPORT*  Clinical Data: Postreduction  LEFT ANKLE COMPLETE -  3+ VIEW  Comparison:  Prior films same day  Findings: Study is limited by casting material artifact.  Again noted displaced fracture of distal tibia and fibula with disruption of the ankle mortise.  No significant change in alignment.  IMPRESSION: Again noted displaced fracture of distal tibia and fibula with disruption of the ankle mortise.  No significant change in alignment.   Original Report Authenticated By: Natasha Mead, M.D.    Dg Ankle Complete Left  04/14/2012  *RADIOLOGY REPORT*  Clinical Data: Ankle pain  LEFT ANKLE COMPLETE - 3+ VIEW  Comparison: None.  Findings: Three views of the left ankle submitted.  There is displaced comminuted fracture of the distal tibia medial malleolus. Oblique displaced fracture of the distal left fibula.  Significant disruption of the ankle mortise with medial displacement of the distal tibia.  IMPRESSION: There is displaced comminuted fracture of the distal tibia medial malleolus.  Oblique displaced fracture of the distal left  fibula. Significant disruption of the ankle mortise with medial displacement of the distal tibia.   Original Report Authenticated By: Natasha Mead, M.D.    Dg Chest Portable 1 View  04/14/2012  *RADIOLOGY REPORT*  Clinical Data: Ankle pain, preop  PORTABLE CHEST - 1 VIEW  Comparison: None.  Findings: Cardiomediastinal silhouette is unremarkable.  Streaky right basilar atelectasis or infiltrate.  No pulmonary edema.  IMPRESSION: No pulmonary edema.  Streaky right basilar atelectasis or infiltrate.   Original Report Authenticated By: Natasha Mead, M.D.     EKG: Independently reviewed.   Time spent: 60 Minutes  Ron Parker [facility] Pager [phone]  If 7PM-7AM, please contact night-coverage [email or web address] Password [facility] 04/14/2012, 8:28 PM

## 2012-04-14 NOTE — ED Notes (Addendum)
Pt set up for conscious sedation. 02 applied. Cardiac monitor on. Code cart at bedside. EDP aware pt ready

## 2012-04-14 NOTE — ED Notes (Signed)
Fluid bolus going due to bp. edp aware

## 2012-04-14 NOTE — ED Notes (Signed)
edp aware of bs 

## 2012-04-14 NOTE — Progress Notes (Signed)
Orthopedic Tech Progress Note Patient Details:  Chloe Owens [facility] 1956-02-12 [id number]  Patient ID: Chloe Owens, female   DOB: [birth year], 56 y.o.   MRN: [id number] Viewed order from doctor's order list;order for stirrup splint only;personally applied splint with Dr. Christain Sacramento, Senetra Dillin 04/14/2012, 8:22 PM

## 2012-04-14 NOTE — ED Notes (Signed)
PT arrived to CDU 10 via EMS transfere from [facility]. PT A/O ace splint to LLE toes warm to touch and moves all toes.

## 2012-04-14 NOTE — ED Notes (Addendum)
Posterior  and stirrups splints applied by EDP.

## 2012-04-14 NOTE — Consult Note (Signed)
Chloe Owens    Chief Complaint: left ankle fracture HPI: The patient is a 56 y.o. female Patient reports she was trying to hurry to answer the phone and fell inside her house. She states when she heard a "pop". Denies hitting her head, LOC, other injuries. Son states after she fell they were unable to get her up and she was even unable to stand on her one good leg with assistance. She presents via EMS. Son reports she had a stroke in 2009 and since then has balance problems. Pt has taken her insulin today but has not eaten today.    Past Medical History  Diagnosis Date  . Diabetes mellitus without complication   . Hypertension   . Stroke     History reviewed. No pertinent past surgical history.  No family history on file.  Social History:  reports that she has been smoking.  She does not have any smokeless tobacco history on file. She reports that she does not drink alcohol or use illicit drugs.  Allergies:  Allergies  Allergen Reactions  . Penicillins Rash   Home Medications    Current Outpatient Rx   Name   Route   Sig   Dispense   Refill   .  ALPRAZOLAM 0.5 MG PO TABS   Oral   Take 0.5 mg by mouth 2 (two) times daily.       .  ASPIRIN EC 81 MG PO TBEC   Oral   Take 81 mg by mouth daily.       Marland Kitchen  DIVALPROEX SODIUM ER 500 MG PO TB24   Oral   Take 2,000 mg by mouth daily.       .  IBUPROFEN 200 MG PO TABS   Oral   Take 200 mg by mouth every 6 (six) hours as needed. Pain.       .  INSULIN GLARGINE 100 UNIT/ML [place] SOLN   Subcutaneous   Inject 40 Units into the skin daily.       Marland Kitchen  LISINOPRIL-HYDROCHLOROTHIAZIDE 20-12.5 MG PO TABS   Oral   Take 1 tablet by mouth daily.       Marland Kitchen  SIMVASTATIN 40 MG PO TABS   Oral   Take 40 mg by mouth every evening.       .  TRAZODONE HCL 50 MG PO TABS   Oral   Take 100 mg by mouth at bedtime.           (Not in a hospital admission)   Physical Exam: WDWF, tremulous, c/o left ankle pain. [place]/AT, UE's no gross bone or joint instability. RLE FROM,  no instability. LLE splinted. intact to light touch   04/14/2012 *RADIOLOGY REPORT* Clinical Data: Ankle pain LEFT ANKLE COMPLETE - 3+ VIEW Comparison: None. Findings: Three views of the left ankle submitted. There is displaced comminuted fracture of the distal tibia medial malleolus. Oblique displaced fracture of the distal left fibula. Significant disruption of the ankle mortise with medial displacement of the distal tibia. IMPRESSION: There is displaced comminuted fracture of the distal tibia medial malleolus. Oblique displaced fracture of the distal left fibula. Significant disruption of the ankle mortise with medial displacement of the distal tibia. Original Report Authenticated By: Natasha Mead, M.D.        Vitals  Temp:  [97.9 F (36.6 C)] 97.9 F (36.6 C) (11/10 1142) Pulse Rate:  [69-94] 75  (11/10 1839) Resp:  [15-20] 16  (11/10 1839) BP: (78-160)/(43-81) 78/50 mmHg (11/10  1840) SpO2:  [95 %-100 %] 95 % (11/10 1839) Weight:  [77.111 kg (170 lb)] 77.111 kg (170 lb) (11/10 1142)  Assessment/Plan  Impression: Displaced  left bimalleolar ankle fracture  Plan of Action: treatment options and risks vs benefits thereof reviewed. Degree of displacement will require ORIF. New splint applied in ED to improve alignment. Elevate LLE, NPO after midnight. OR tomorrow afternoon.  Lonya Johannesen M 04/14/2012, 7:19 PM

## 2012-04-14 NOTE — ED Provider Notes (Signed)
6:40PM Pt transferred here from [facility] to see Dr. Rennis Chris. Upon her arriving to the ED she is lethargic. EMS tells me that she has been battel ing hypoglycemia and has been given D50 her sugars are 157 upon arrival. Her oxygen saturation dropped to 85% and went back up to 94% on nasal cannula.   Upon evaluating her, her BP is noted to be 78/58. I discussed this with Dr. Bernette Mayers.Due to her propofol and pain medications not being given recently we do not feel she needs narcan at this time. 2L normal saline given. Will monitor glucose very closely as well.  Dr. Rennis Chris is here to see patient. I have discussed with him my medical concerns. He would like to have medicine involved to manage the medical issues of hypoglycemia, hypotension and tremors.   Hospitalist requested that I add blood cultures, lactate and urine. She has agreed to come see patient in the ED.  7:58PM- Patients blood pressure starting to elevate to 91/64. Pt has received 1L and will be getting second Liter in ED.  Dr. Lovell Sheehan has come to see patient in ED. Dr. Rennis Chris  Will take patient to OR tomorrow at 3:00pm if no adverse events take place.  Dorthula Matas, PA 04/14/12 1959  Dorthula Matas, PA 04/14/12 2009

## 2012-04-14 NOTE — ED Notes (Signed)
Collected a urine on pt in case needed, pt's urine is sitting in pt's room

## 2012-04-14 NOTE — ED Notes (Signed)
vo dr knapp to hold dilaudid and versed until procedure.

## 2012-04-14 NOTE — ED Provider Notes (Signed)
History     CSN: [id number]  Arrival date & time 04/14/12  1126   First MD Initiated Contact with Patient 04/14/12 1258      Chief Complaint  Patient presents with  . Ankle Pain    (Consider location/radiation/quality/duration/timing/severity/associated sxs/prior treatment) HPI  Patient reports she was trying to hurry to answer the phone and fell inside her house. She states when she heard a "pop". Denies hitting her head, LOC, other injuries. Son states after she fell they were unable to get her up and she was even unable to stand on her one good leg with assistance. She presents via EMS. Son reports she had a stroke in 2009 and since then has balance problems. Pt has taken her insulin today but has not eaten today.   PCP Dr Murray Hodgkins  Past Medical History  Diagnosis Date  . Diabetes mellitus without complication   . Hypertension   . Stroke     History reviewed. No pertinent past surgical history.  No family history on file.  History  Substance Use Topics  . Smoking status: Current Every Day Smoker 2ppd  . Smokeless tobacco: Not on file  . Alcohol Use: No  Lives with spouse Lives at home  OB History    Grav Para Term Preterm Abortions TAB SAB Ect Mult Living                  Review of Systems  All other systems reviewed and are negative.    Allergies  Penicillins  Home Medications   Current Outpatient Rx  Name  Route  Sig  Dispense  Refill  . ALPRAZOLAM 0.5 MG PO TABS   Oral   Take 0.5 mg by mouth 2 (two) times daily.         . ASPIRIN EC 81 MG PO TBEC   Oral   Take 81 mg by mouth daily.         Marland Kitchen DIVALPROEX SODIUM ER 500 MG PO TB24   Oral   Take 2,000 mg by mouth daily.          . IBUPROFEN 200 MG PO TABS   Oral   Take 200 mg by mouth every 6 (six) hours as needed. Pain.         . INSULIN GLARGINE 100 UNIT/ML  SOLN   Subcutaneous   Inject 40 Units into the skin daily.         Marland Kitchen LISINOPRIL-HYDROCHLOROTHIAZIDE 20-12.5 MG  PO TABS   Oral   Take 1 tablet by mouth daily.         Marland Kitchen SIMVASTATIN 40 MG PO TABS   Oral   Take 40 mg by mouth every evening.         . TRAZODONE HCL 50 MG PO TABS   Oral   Take 100 mg by mouth at bedtime.          ED Triage Vitals  Enc Vitals Group     BP 04/14/12 1142 144/67 mmHg     Pulse Rate 04/14/12 1142 76      Resp 04/14/12 1142 20      Temp 04/14/12 1142 97.9 F (36.6 C)     Temp src 04/14/12 1142 Oral     SpO2 04/14/12 1142 99 %     Weight 04/14/12 1142 170 lb (77.111 kg)     Height 04/14/12 1142 5\' 8"  (1.727 m)     Head Cir --      Peak  Flow --      Pain Score 04/14/12 1128 Eight     Pain Loc --      Pain Edu? --      Excl. in GC? --    Vital signs normal    Physical Exam  Nursing note and vitals reviewed. Constitutional: She is oriented to person, place, and time. She appears well-developed and well-nourished.  Non-toxic appearance. She does not appear ill. No distress.  HENT:  Head: Normocephalic and atraumatic.  Right Ear: External ear normal.  Left Ear: External ear normal.  Nose: Nose normal. No mucosal edema or rhinorrhea.  Mouth/Throat: Oropharynx is clear and moist and mucous membranes are normal. No dental abscesses or uvula swelling.  Eyes: Conjunctivae normal and EOM are normal. Pupils are equal, round, and reactive to light.  Neck: Normal range of motion and full passive range of motion without pain. Neck supple.  Cardiovascular: Normal rate, regular rhythm and normal heart sounds.  Exam reveals no gallop and no friction rub.   No murmur heard. Pulmonary/Chest: Effort normal and breath sounds normal. No respiratory distress. She has no wheezes. She has no rhonchi. She has no rales. She exhibits no tenderness and no crepitus.  Abdominal: Normal appearance and bowel sounds are normal.  Musculoskeletal: She exhibits edema and tenderness.       Patient noted to have bruising especially over her medial malleolus with a parent deformity of her  left ankle. She is intact distal pulses. Her knee proximal lower leg and thigh and hip are nontender. She is able to flex at the knee and hip without difficulty.  Neurological: She is alert and oriented to person, place, and time. She has normal strength. No cranial nerve deficit.  Skin: Skin is warm, dry and intact. No rash noted. No erythema. No pallor.  Psychiatric: She has a normal mood and affect. Her speech is normal and behavior is normal. Her mood appears not anxious.    ED Course  Procedural sedation Date/Time: 04/14/2012 2:35 PM Performed by: Lynelle Doctor, Raetta Agostinelli L Authorized by: Ward Givens Consent: Verbal consent obtained. Written consent obtained. Risks and benefits: risks, benefits and alternatives were discussed Consent given by: patient Patient identity confirmed: verbally with patient, arm band and hospital-assigned identification number Time out: Immediately prior to procedure a "time out" was called to verify the correct patient, procedure, equipment, support staff and site/side marked as required. Patient sedated: yes Sedatives: midazolam Analgesia: hydromorphone Sedation start date/time: 04/14/2012 2:21 PM Sedation end date/time: 04/14/2012 3:20 PM Vitals: Vital signs were monitored during sedation. Patient tolerance: Patient tolerated the procedure well with no immediate complications. Comments: Pt given propofol 20 mg IV    Attempted reduction of her displaced ankle, however the Fiberglas splint material never got hard. I attempted to hold her foot and position for 30 minutes however patient started wiggling her foot and moving her leg. The Fiberglas material never got hard. It had to be removed and replaced with plaster however at this point patient was awake and moving her foot despite saying that she was not having pain. Postreduction films were done. Patient tolerated the sedation well.   Patient given 1 amp of D50 for her hypoglycemia.  Family request [facility] for her care, they have treated other family members  16:16 Dr Rennis Chris states could wait for bruising and swelling to improve then arrange surgery.   16:20 Dr Rennis Chris has reviewed her xrays and states to send to [facility] ED with pre-op studies. Keep  NPO.  16:26 Laverda Sorenson Nurse at [facility] notified. Will send to CDU  16:27 Dr Adalberto Cole and Marlon Pel, PA in CDU notified of transfer.   Pt placed on D5 b/o recurring hypoglycemia.    Results for orders placed during the hospital encounter of 04/14/12  GLUCOSE, CAPILLARY      Component Value Range   Glucose-Capillary 131 (*) 70 - 99 mg/dL  GLUCOSE, CAPILLARY      Component Value Range   Glucose-Capillary 57 (*) 70 - 99 mg/dL  GLUCOSE, CAPILLARY      Component Value Range   Glucose-Capillary 154 (*) 70 - 99 mg/dL  CBC WITH DIFFERENTIAL      Component Value Range   WBC 12.6 (*) 4.0 - 10.5 K/uL   RBC 3.98  3.87 - 5.11 MIL/uL   Hemoglobin 12.2  12.0 - 15.0 g/dL   HCT 16.1 (*) 09.6 - 04.5 %   MCV 88.7  78.0 - 100.0 fL   MCH 30.7  26.0 - 34.0 pg   MCHC 34.6  30.0 - 36.0 g/dL   RDW 40.9  81.1 - 91.4 %   Platelets 189  150 - 400 K/uL   Neutrophils Relative 53  43 - 77 %   Lymphocytes Relative 40  12 - 46 %   Monocytes Relative 5  3 - 12 %   Eosinophils Relative 2  0 - 5 %   Basophils Relative 0  0 - 1 %   Band Neutrophils 0  0 - 10 %   Metamyelocytes Relative 0     Myelocytes 0     Promyelocytes Absolute 0     Blasts 0     nRBC 0  0 /100 WBC   Neutro Abs 6.7  1.7 - 7.7 K/uL   Lymphs Abs 5.0 (*) 0.7 - 4.0 K/uL   Monocytes Absolute 0.6  0.1 - 1.0 K/uL   Eosinophils Absolute 0.3  0.0 - 0.7 K/uL   Basophils Absolute 0.0  0.0 - 0.1 K/uL  COMPREHENSIVE METABOLIC PANEL      Component Value Range   Sodium 140  135 - 145 mEq/L   Potassium 3.9  3.5 - 5.1 mEq/L   Chloride 102  96 - 112 mEq/L   CO2 32  19 - 32 mEq/L   Glucose, Bld 65 (*) 70 - 99 mg/dL   BUN 24 (*) 6 - 23 mg/dL   Creatinine, Ser 7.82  0.50 - 1.10 mg/dL    Calcium 8.6  8.4 - 95.6 mg/dL   Total Protein 6.3  6.0 - 8.3 g/dL   Albumin 2.9 (*) 3.5 - 5.2 g/dL   AST 13  0 - 37 U/L   ALT 6  0 - 35 U/L   Alkaline Phosphatase 53  39 - 117 U/L   Total Bilirubin 0.2 (*) 0.3 - 1.2 mg/dL   GFR calc non Af Amer 63 (*) >90 mL/min   GFR calc Af Amer 73 (*) >90 mL/min  GLUCOSE, CAPILLARY      Component Value Range   Glucose-Capillary 48 (*) 70 - 99 mg/dL   Laboratory interpretation all normal except hypoglycemia    Dg Ankle Complete Left  04/14/2012  *RADIOLOGY REPORT*  Clinical Data: Postreduction  LEFT ANKLE COMPLETE - 3+ VIEW  Comparison:  Prior films same day  Findings: Study is limited by casting material artifact.  Again noted displaced fracture of distal tibia and fibula with disruption of the ankle mortise.  No significant change in alignment.  IMPRESSION: Again noted displaced fracture of distal tibia and fibula with disruption of the ankle mortise.  No significant change in alignment.   Original Report Authenticated By: Natasha Mead, M.D.    Dg Ankle Complete Left  04/14/2012  *RADIOLOGY REPORT*  Clinical Data: Ankle pain  LEFT ANKLE COMPLETE - 3+ VIEW  Comparison: None.  Findings: Three views of the left ankle submitted.  There is displaced comminuted fracture of the distal tibia medial malleolus. Oblique displaced fracture of the distal left fibula.  Significant disruption of the ankle mortise with medial displacement of the distal tibia.  IMPRESSION: There is displaced comminuted fracture of the distal tibia medial malleolus.  Oblique displaced fracture of the distal left fibula. Significant disruption of the ankle mortise with medial displacement of the distal tibia.   Original Report Authenticated By: Natasha Mead, M.D.     Date: 04/14/2012  Rate: 73  Rhythm: normal sinus rhythm  QRS Axis: normal  Intervals: QT prolonged  ST/T Wave abnormalities: nonspecific ST changes  Conduction Disutrbances:none  Narrative Interpretation: low voltage  Old  EKG Reviewed: changes noted more prolongation of QTI    1. Fracture dislocation of ankle   2. Hypoglycemia    Plan transfer to [facility] ED to meet Dr Rennis Chris  Devoria Albe, MD, FACEP    MDM          Ward Givens, MD 04/14/12 [phone]

## 2012-04-14 NOTE — ED Notes (Addendum)
Pt has had 1250 fluids at this time. 2nd one Liter bag hung. Pt initially had a bag then a one liter bag. Pt taken off 02 for trial.

## 2012-04-14 NOTE — ED Notes (Signed)
Portable x-ray done

## 2012-04-14 NOTE — ED Notes (Signed)
Pt states she tripped and fell. Complain of pain in left ankle

## 2012-04-14 NOTE — ED Notes (Signed)
Pt moving foot while cast is setting.

## 2012-04-15 ENCOUNTER — Encounter (HOSPITAL_COMMUNITY): Payer: Self-pay | Admitting: Anesthesiology

## 2012-04-15 ENCOUNTER — Inpatient Hospital Stay (HOSPITAL_COMMUNITY): Payer: BC Managed Care – PPO

## 2012-04-15 ENCOUNTER — Encounter (HOSPITAL_COMMUNITY)
Admission: EM | Disposition: A | Discharge status: Skilled Nursing Facility | Payer: Self-pay | Source: Home / Self Care | Attending: Emergency Medicine

## 2012-04-15 ENCOUNTER — Encounter (HOSPITAL_COMMUNITY): Payer: Self-pay | Admitting: *Deleted

## 2012-04-15 ENCOUNTER — Inpatient Hospital Stay (HOSPITAL_COMMUNITY): Payer: BC Managed Care – PPO | Admitting: Anesthesiology

## 2012-04-15 DIAGNOSIS — W010XXA Fall on same level from slipping, tripping and stumbling without subsequent striking against object, initial encounter: Secondary | ICD-10-CM | POA: Diagnosis not present

## 2012-04-15 DIAGNOSIS — S9306XA Dislocation of unspecified ankle joint, initial encounter: Secondary | ICD-10-CM | POA: Diagnosis not present

## 2012-04-15 DIAGNOSIS — Z4789 Encounter for other orthopedic aftercare: Secondary | ICD-10-CM | POA: Diagnosis not present

## 2012-04-15 DIAGNOSIS — E119 Type 2 diabetes mellitus without complications: Secondary | ICD-10-CM | POA: Diagnosis not present

## 2012-04-15 DIAGNOSIS — S82843A Displaced bimalleolar fracture of unspecified lower leg, initial encounter for closed fracture: Secondary | ICD-10-CM | POA: Diagnosis not present

## 2012-04-15 DIAGNOSIS — S82899A Other fracture of unspecified lower leg, initial encounter for closed fracture: Secondary | ICD-10-CM | POA: Diagnosis not present

## 2012-04-15 HISTORY — PX: ANKLE CLOSED REDUCTION: SHX880

## 2012-04-15 LAB — GLUCOSE, CAPILLARY
Glucose-Capillary: 102 mg/dL — ABNORMAL HIGH (ref 70–99)
Glucose-Capillary: 172 mg/dL — ABNORMAL HIGH (ref 70–99)
Glucose-Capillary: 201 mg/dL — ABNORMAL HIGH (ref 70–99)
Glucose-Capillary: 202 mg/dL — ABNORMAL HIGH (ref 70–99)
Glucose-Capillary: 236 mg/dL — ABNORMAL HIGH (ref 70–99)
Glucose-Capillary: 94 mg/dL (ref 70–99)
Glucose-Capillary: 99 mg/dL (ref 70–99)

## 2012-04-15 LAB — SURGICAL PCR SCREEN
MRSA, PCR: NEGATIVE
Staphylococcus aureus: NEGATIVE

## 2012-04-15 LAB — HEMOGLOBIN A1C
Hgb A1c MFr Bld: 8.3 % — ABNORMAL HIGH (ref <5.7)
Mean Plasma Glucose: 192 mg/dL — ABNORMAL HIGH (ref <117)

## 2012-04-15 LAB — VALPROIC ACID LEVEL: Valproic Acid Lvl: 59 ug/mL (ref 50.0–100.0)

## 2012-04-15 SURGERY — CLOSED REDUCTION, ANKLE
Anesthesia: General | Site: Ankle | Laterality: Left | Wound class: Clean

## 2012-04-15 MED ORDER — VITAMIN B-12 1000 MCG PO TABS
1000.0000 ug | ORAL_TABLET | Freq: Every day | ORAL | Status: DC
  Administered 2012-04-15 – 2012-04-17 (×3): 1000 ug via ORAL
  Filled 2012-04-15 (×3): qty 1

## 2012-04-15 MED ORDER — LIDOCAINE HCL (CARDIAC) 20 MG/ML IV SOLN
INTRAVENOUS | Status: DC | PRN
  Administered 2012-04-15: 60 mg via INTRAVENOUS

## 2012-04-15 MED ORDER — DOCUSATE SODIUM 100 MG PO CAPS
100.0000 mg | ORAL_CAPSULE | Freq: Two times a day (BID) | ORAL | Status: DC
  Administered 2012-04-15 – 2012-04-17 (×4): 100 mg via ORAL
  Filled 2012-04-15 (×5): qty 1

## 2012-04-15 MED ORDER — METOCLOPRAMIDE HCL 10 MG PO TABS: 5.0000 mg | ORAL_TABLET | Freq: Three times a day (TID) | ORAL | Status: DC | PRN

## 2012-04-15 MED ORDER — DIPHENHYDRAMINE HCL 12.5 MG/5ML PO ELIX: 12.5000 mg | ORAL_SOLUTION | ORAL | Status: DC | PRN

## 2012-04-15 MED ORDER — ESCITALOPRAM OXALATE 20 MG PO TABS
20.0000 mg | ORAL_TABLET | Freq: Two times a day (BID) | ORAL | Status: DC
  Administered 2012-04-15 – 2012-04-17 (×4): 20 mg via ORAL
  Filled 2012-04-15 (×6): qty 1

## 2012-04-15 MED ORDER — BISACODYL 5 MG PO TBEC: 5.0000 mg | DELAYED_RELEASE_TABLET | Freq: Every day | ORAL | Status: DC | PRN

## 2012-04-15 MED ORDER — ONDANSETRON HCL 4 MG/2ML IJ SOLN: 4.0000 mg | Freq: Four times a day (QID) | INTRAMUSCULAR | Status: DC | PRN

## 2012-04-15 MED ORDER — METOCLOPRAMIDE HCL 5 MG/ML IJ SOLN: 5.0000 mg | Freq: Three times a day (TID) | INTRAMUSCULAR | Status: DC | PRN

## 2012-04-15 MED ORDER — HYDROCODONE-ACETAMINOPHEN 5-325 MG PO TABS
1.0000 | ORAL_TABLET | ORAL | Status: DC | PRN
  Administered 2012-04-16 (×2): 1 via ORAL
  Filled 2012-04-15: qty 2
  Filled 2012-04-15: qty 1

## 2012-04-15 MED ORDER — PROPOFOL 10 MG/ML IV BOLUS
INTRAVENOUS | Status: DC | PRN
  Administered 2012-04-15: 50 mg via INTRAVENOUS

## 2012-04-15 MED ORDER — FLEET ENEMA 7-19 GM/118ML RE ENEM: 1.0000 | ENEMA | Freq: Once | RECTAL | Status: AC | PRN

## 2012-04-15 MED ORDER — FOLIC ACID 1 MG PO TABS
1.0000 mg | ORAL_TABLET | Freq: Every day | ORAL | Status: DC
  Administered 2012-04-15 – 2012-04-17 (×3): 1 mg via ORAL
  Filled 2012-04-15 (×3): qty 1

## 2012-04-15 MED ORDER — METOPROLOL TARTRATE 1 MG/ML IV SOLN
5.0000 mg | INTRAVENOUS | Status: DC | PRN
  Filled 2012-04-15 (×3): qty 5

## 2012-04-15 MED ORDER — MORPHINE SULFATE (PF) 1 MG/ML IV SOLN
INTRAVENOUS | Status: AC
  Filled 2012-04-15: qty 25

## 2012-04-15 MED ORDER — LACTATED RINGERS IV SOLN
INTRAVENOUS | Status: DC | PRN
  Administered 2012-04-15: 14:00:00 via INTRAVENOUS

## 2012-04-15 MED ORDER — POLYETHYLENE GLYCOL 3350 17 G PO PACK: 17.0000 g | PACK | Freq: Every day | ORAL | Status: DC | PRN

## 2012-04-15 MED ORDER — MIDAZOLAM HCL 5 MG/5ML IJ SOLN
INTRAMUSCULAR | Status: DC | PRN
  Administered 2012-04-15: 1 mg via INTRAVENOUS

## 2012-04-15 MED ORDER — DIVALPROEX SODIUM ER 500 MG PO TB24
500.0000 mg | ORAL_TABLET | Freq: Two times a day (BID) | ORAL | Status: DC
Start: 2012-04-15 — End: 2012-04-15
  Filled 2012-04-15 (×2): qty 1

## 2012-04-15 MED ORDER — INSULIN GLARGINE 100 UNIT/ML ~~LOC~~ SOLN: 20.0000 [IU] | Freq: Every day | SUBCUTANEOUS | Status: DC

## 2012-04-15 MED ORDER — PANTOPRAZOLE SODIUM 40 MG PO TBEC: 40.0000 mg | DELAYED_RELEASE_TABLET | Freq: Every day | ORAL | Status: DC

## 2012-04-15 MED ORDER — INSULIN ASPART 100 UNIT/ML ~~LOC~~ SOLN
0.0000 [IU] | Freq: Three times a day (TID) | SUBCUTANEOUS | Status: DC
  Administered 2012-04-16: 3 [IU] via SUBCUTANEOUS
  Administered 2012-04-16: 8 [IU] via SUBCUTANEOUS
  Administered 2012-04-16 – 2012-04-17 (×3): 3 [IU] via SUBCUTANEOUS

## 2012-04-15 MED ORDER — ASPIRIN EC 81 MG PO TBEC
81.0000 mg | DELAYED_RELEASE_TABLET | Freq: Every day | ORAL | Status: DC
  Administered 2012-04-15 – 2012-04-17 (×3): 81 mg via ORAL
  Filled 2012-04-15 (×3): qty 1

## 2012-04-15 MED ORDER — HYDROMORPHONE HCL PF 1 MG/ML IJ SOLN: 0.5000 mg | INTRAMUSCULAR | Status: DC | PRN

## 2012-04-15 MED ORDER — LACTATED RINGERS IV SOLN: INTRAVENOUS | Status: DC

## 2012-04-15 MED ORDER — ONDANSETRON HCL 4 MG PO TABS: 4.0000 mg | ORAL_TABLET | Freq: Four times a day (QID) | ORAL | Status: DC | PRN

## 2012-04-15 MED ORDER — ENOXAPARIN SODIUM 40 MG/0.4ML ~~LOC~~ SOLN
40.0000 mg | SUBCUTANEOUS | Status: DC
  Administered 2012-04-16 – 2012-04-17 (×2): 40 mg via SUBCUTANEOUS
  Filled 2012-04-15 (×3): qty 0.4

## 2012-04-15 SURGICAL SUPPLY — 59 items
BANDAGE ELASTIC 4 VELCRO ST LF (GAUZE/BANDAGES/DRESSINGS) ×1 IMPLANT
BANDAGE ELASTIC 6 VELCRO ST LF (GAUZE/BANDAGES/DRESSINGS) ×3 IMPLANT
BANDAGE ESMARK 6X9 LF (GAUZE/BANDAGES/DRESSINGS) ×1 IMPLANT
BLADE SURG 10 STRL SS (BLADE) ×1 IMPLANT
BLADE SURG ROTATE 9660 (MISCELLANEOUS) IMPLANT
BNDG CMPR 9X6 STRL LF SNTH (GAUZE/BANDAGES/DRESSINGS)
BNDG ESMARK 6X9 LF (GAUZE/BANDAGES/DRESSINGS)
CLOTH BEACON ORANGE TIMEOUT ST (SAFETY) ×1 IMPLANT
COVER MAYO STAND STRL (DRAPES) ×1 IMPLANT
COVER SURGICAL LIGHT HANDLE (MISCELLANEOUS) ×1 IMPLANT
CUFF TOURNIQUET SINGLE 18IN (TOURNIQUET CUFF) IMPLANT
CUFF TOURNIQUET SINGLE 24IN (TOURNIQUET CUFF) IMPLANT
CUFF TOURNIQUET SINGLE 34IN LL (TOURNIQUET CUFF) IMPLANT
CUFF TOURNIQUET SINGLE 44IN (TOURNIQUET CUFF) IMPLANT
DRAPE C-ARM 42X72 X-RAY (DRAPES) IMPLANT
DRAPE INCISE IOBAN 66X45 STRL (DRAPES) IMPLANT
DRAPE LAPAROTOMY T 102X78X121 (DRAPES) IMPLANT
DRAPE OEC MINIVIEW 54X84 (DRAPES) IMPLANT
DRAPE PROXIMA HALF (DRAPES) IMPLANT
DRAPE SURG 17X23 STRL (DRAPES) IMPLANT
DRAPE U-SHAPE 47X51 STRL (DRAPES) ×1 IMPLANT
DRSG ADAPTIC 3X8 NADH LF (GAUZE/BANDAGES/DRESSINGS) ×1 IMPLANT
DRSG PAD ABDOMINAL 8X10 ST (GAUZE/BANDAGES/DRESSINGS) ×8 IMPLANT
DURAPREP 26ML APPLICATOR (WOUND CARE) ×1 IMPLANT
ELECT REM PT RETURN 9FT ADLT (ELECTROSURGICAL)
ELECTRODE REM PT RTRN 9FT ADLT (ELECTROSURGICAL) ×1 IMPLANT
FACESHIELD LNG OPTICON STERILE (SAFETY) ×1 IMPLANT
GAUZE XEROFORM 5X9 LF (GAUZE/BANDAGES/DRESSINGS) ×2 IMPLANT
GLOVE BIO SURGEON STRL SZ7.5 (GLOVE) ×1 IMPLANT
GLOVE BIO SURGEON STRL SZ8 (GLOVE) ×1 IMPLANT
GLOVE EUDERMIC 7 POWDERFREE (GLOVE) ×1 IMPLANT
GLOVE SS BIOGEL STRL SZ 7.5 (GLOVE) ×1 IMPLANT
GLOVE SUPERSENSE BIOGEL SZ 7.5 (GLOVE)
GOWN STRL NON-REIN LRG LVL3 (GOWN DISPOSABLE) ×1 IMPLANT
GOWN STRL REIN XL XLG (GOWN DISPOSABLE) ×2 IMPLANT
KIT BASIN OR (CUSTOM PROCEDURE TRAY) ×1 IMPLANT
KIT ROOM TURNOVER OR (KITS) ×3 IMPLANT
MANIFOLD NEPTUNE II (INSTRUMENTS) ×1 IMPLANT
NEEDLE 22X1 1/2 (OR ONLY) (NEEDLE) IMPLANT
NS IRRIG 1000ML POUR BTL (IV SOLUTION) ×1 IMPLANT
PACK ORTHO EXTREMITY (CUSTOM PROCEDURE TRAY) ×1 IMPLANT
PAD ARMBOARD 7.5X6 YLW CONV (MISCELLANEOUS) ×2 IMPLANT
PAD CAST 4YDX4 CTTN HI CHSV (CAST SUPPLIES) ×4 IMPLANT
PADDING CAST COTTON 4X4 STRL (CAST SUPPLIES) ×6
SPONGE GAUZE 4X4 12PLY (GAUZE/BANDAGES/DRESSINGS) ×1 IMPLANT
SPONGE LAP 4X18 X RAY DECT (DISPOSABLE) ×2 IMPLANT
STAPLER VISISTAT 35W (STAPLE) ×1 IMPLANT
STRIP CLOSURE SKIN 1/2X4 (GAUZE/BANDAGES/DRESSINGS) ×2 IMPLANT
SUCTION FRAZIER TIP 10 FR DISP (SUCTIONS) ×1 IMPLANT
SUT MNCRL AB 3-0 PS2 18 (SUTURE) ×2 IMPLANT
SUT VIC AB 0 CT1 27 (SUTURE)
SUT VIC AB 0 CT1 27XBRD ANBCTR (SUTURE) IMPLANT
SUT VIC AB 2-0 CT1 27 (SUTURE)
SUT VIC AB 2-0 CT1 TAPERPNT 27 (SUTURE) ×3 IMPLANT
SYR CONTROL 10ML LL (SYRINGE) IMPLANT
TOWEL OR 17X24 6PK STRL BLUE (TOWEL DISPOSABLE) ×1 IMPLANT
TOWEL OR 17X26 10 PK STRL BLUE (TOWEL DISPOSABLE) ×1 IMPLANT
TUBE CONNECTING 12X1/4 (SUCTIONS) ×1 IMPLANT
WATER STERILE IRR 1000ML POUR (IV SOLUTION) ×1 IMPLANT

## 2012-04-15 NOTE — ED Notes (Signed)
Assisted pt to the bedside commode.

## 2012-04-15 NOTE — Anesthesia Preprocedure Evaluation (Addendum)
Anesthesia Evaluation  Patient identified by MRN, date of birth, ID band Patient awake    Reviewed: Allergy & Precautions, H&P , NPO status , Patient's Chart, lab work & pertinent test results  History of Anesthesia Complications Negative for: history of anesthetic complications  Airway Mallampati: II TM Distance: >3 FB Neck ROM: Full    Dental  (+) Teeth Intact and Dental Advisory Given   Pulmonary neg pulmonary ROS,    Pulmonary exam normal       Cardiovascular hypertension, Pt. on medications     Neuro/Psych CVA, No Residual Symptoms    GI/Hepatic negative GI ROS, Neg liver ROS,   Endo/Other  diabetes, Insulin Dependent  Renal/GU negative Renal ROS     Musculoskeletal   Abdominal   Peds  Hematology   Anesthesia Other Findings   Reproductive/Obstetrics                          Anesthesia Physical Anesthesia Plan  ASA: III  Anesthesia Plan: General   Post-op Pain Management:    Induction: Intravenous  Airway Management Planned: Mask and LMA  Additional Equipment:   Intra-op Plan:   Post-operative Plan: Extubation in OR  Informed Consent: I have reviewed the patients History and Physical, chart, labs and discussed the procedure including the risks, benefits and alternatives for the proposed anesthesia with the patient or authorized representative who has indicated his/her understanding and acceptance.   Dental advisory given  Plan Discussed with: CRNA, Anesthesiologist and Surgeon  Anesthesia Plan Comments:         Anesthesia Quick Evaluation

## 2012-04-15 NOTE — Preoperative (Signed)
Beta Blockers   Reason not to administer Beta Blockers:Not Applicable 

## 2012-04-15 NOTE — Progress Notes (Signed)
[facility]                                                                                Patient Demographics  Chloe Owens, is a 56 y.o. female  ZOX:[id number]  WJX:[id number]  DOB - 1955/11/05  Admit date - 04/14/2012  Admitting Physician Senaida Lange, MD  Outpatient Primary MD for the patient is No primary provider on file.  LOS - 1   Chief Complaint  Patient presents with  . Ankle Pain        Assessment & Plan    1. Left Ankle Fx: Secondary to trip and fall, Ortho: Dr. Kirtland Bouchard.Supple Plan for surgery on 04/16/2011 , discuss with patient's husband, she does not have any underlying heart problems, she's currently chest pain palpitation and shortness of breath free, stable baseline EKG.    2. Hypoglycemia-Improved, currently she is n.p.o. for surgery, gentle D5 drip, reduce Lantus dose with holding parameters, every 4 hours Accu-Cheks with low-dose sliding scale insulin.     3. Hypotension- resolved, blood pressure medications on hold, gentle IV fluids monitor. Of ordered when necessary IV Lopressor.     4. Hyperlipidemia- continue Simvastatin      5. CVA hx -stable , has was baseline memory issues and confusion on and off per her husband, will resume aspirin tomorrow on 04/16/2012 along with home dose statin.      Time Spent in minutes   35   Antibiotics    Anti-infectives    None      Scheduled Meds:    . ALPRAZolam  0.5 mg Oral BID  . aspirin EC  81 mg Oral Daily  . [COMPLETED] dextrose  1 ampule Intravenous Once  . [COMPLETED] dextrose      . divalproex  500 mg Oral BID  . divalproex  500 mg Oral BID  . escitalopram  20 mg Oral BID  . folic acid  1 mg Oral Daily  . [COMPLETED] HYDROmorphone  1 mg Intravenous Once  . insulin aspart  0-9 Units Subcutaneous Q4H  . insulin glargine  20 Units Subcutaneous Daily  . insulin glargine  40 Units Subcutaneous Daily  . [COMPLETED] midazolam  2 mg Intravenous Once  . [COMPLETED]  ondansetron  8 mg Oral Once  . pantoprazole  40 mg Oral QHS  . pantoprazole  40 mg Oral Daily  . [COMPLETED] propofol      . simvastatin  40 mg Oral QPM  . [COMPLETED] sodium chloride  1,000 mL Intravenous Once  . [COMPLETED] sodium chloride  1,000 mL Intravenous Once  . vitamin B-12  1,000 mcg Oral Daily  . [DISCONTINUED] HYDROmorphone  1 mg Intramuscular Once  . [DISCONTINUED] insulin glargine  20 Units Subcutaneous Daily   Continuous Infusions:    . dextrose 5 % and 0.9% NaCl 75 mL/hr at 04/15/12 1043  . [DISCONTINUED] sodium chloride Stopped (04/15/12 0109)   PRN Meds:.[COMPLETED] HYDROmorphone, metoprolol, morphine injection, [EXPIRED] ondansetron (ZOFRAN) IV   DVT Prophylaxis  Per ortho -primary team  Lab Results  Component Value Date   PLT 189 04/14/2012      Susa Raring K M.D on 04/15/2012 at 11:43 AM  Between 7am  to 7pm - Pager - [phone]  After 7pm go to [email or web address] - password [email or web address]  And look for the night coverage person covering for me after hours  [facility] Office  [phone]    Subjective:   Klhoe Bianco today has, No headache, No chest pain, No abdominal pain - No Nausea, No new weakness tingling or numbness, No Cough - SOB.    Objective:   Filed Vitals:   04/14/12 2300 04/14/12 2358 04/15/12 0125 04/15/12 0128  BP: 127/70 129/64  145/67  Pulse: 91 102  97  Temp:    98 F (36.7 C)  TempSrc:    Axillary  Resp:  20  18  Height:   5\' 8"  (1.727 m)   Weight:   87.68 kg (193 lb 4.8 oz)   SpO2: 97% 99%  91%    Wt Readings from Last 3 Encounters:  04/15/12 87.68 kg (193 lb 4.8 oz)  04/15/12 87.68 kg (193 lb 4.8 oz)     Intake/Output Summary (Last 24 hours) at 04/15/12 1143 Last data filed at 04/15/12 0900  Gross per 24 hour  Intake   2000 ml  Output    350 ml  Net   1650 ml    Exam Awake Alert, Oriented X 1, No new F.N deficits, Normal affect [place].AT,PERRAL Supple Neck,No JVD, No cervical lymphadenopathy  appriciated.  Symmetrical Chest wall movement, Good air movement bilaterally, CTAB RRR,No Gallops,Rubs or new Murmurs, No Parasternal Heave +ve B.Sounds, Abd Soft, Non tender, No organomegaly appriciated, No rebound - guarding or rigidity. No Cyanosis, Clubbing or edema, No new Rash or bruise , L leg in splint   Data Review   Micro Results Recent Results (from the past 240 hour(s))  SURGICAL PCR SCREEN     Status: Normal   Collection Time   04/15/12  8:11 AM      Component Value Range Status Comment   MRSA, PCR NEGATIVE  NEGATIVE Final    Staphylococcus aureus NEGATIVE  NEGATIVE Final     Radiology Reports Dg Ankle Complete Left  04/15/2012  *RADIOLOGY REPORT*  Clinical Data: Ankle fracture with reduction and casting  LEFT ANKLE COMPLETE - 3+ VIEW  Comparison: 04/14/2012  Findings: Casting material is again identified.  There are again seen distal tibial and fibular fractures with some displacement which is stable in appearance.  There is some lateral displacement of the talus with respect to the distal tibia.  IMPRESSION: Persistent distal tibial and fibular fractures with casting material.  The overall appearance is stable from prior exam.   Original Report Authenticated By: Alcide Clever, M.D.    Dg Ankle Complete Left  04/14/2012  *RADIOLOGY REPORT*  Clinical Data: Postreduction  LEFT ANKLE COMPLETE - 3+ VIEW  Comparison:  Prior films same day  Findings: Study is limited by casting material artifact.  Again noted displaced fracture of distal tibia and fibula with disruption of the ankle mortise.  No significant change in alignment.  IMPRESSION: Again noted displaced fracture of distal tibia and fibula with disruption of the ankle mortise.  No significant change in alignment.   Original Report Authenticated By: Natasha Mead, M.D.    Dg Ankle Complete Left  04/14/2012  *RADIOLOGY REPORT*  Clinical Data: Ankle pain  LEFT ANKLE COMPLETE - 3+ VIEW  Comparison: None.  Findings: Three views of  the left ankle submitted.  There is displaced comminuted fracture of the distal tibia medial malleolus. Oblique displaced fracture of the distal left fibula.  Significant disruption  of the ankle mortise with medial displacement of the distal tibia.  IMPRESSION: There is displaced comminuted fracture of the distal tibia medial malleolus.  Oblique displaced fracture of the distal left fibula. Significant disruption of the ankle mortise with medial displacement of the distal tibia.   Original Report Authenticated By: Natasha Mead, M.D.    Dg Chest Portable 1 View  04/14/2012  *RADIOLOGY REPORT*  Clinical Data: Ankle pain, preop  PORTABLE CHEST - 1 VIEW  Comparison: None.  Findings: Cardiomediastinal silhouette is unremarkable.  Streaky right basilar atelectasis or infiltrate.  No pulmonary edema.  IMPRESSION: No pulmonary edema.  Streaky right basilar atelectasis or infiltrate.   Original Report Authenticated By: Natasha Mead, M.D.     CBC  Lab 04/14/12 1623  WBC 12.6*  HGB 12.2  HCT 35.3*  PLT 189  MCV 88.7  MCH 30.7  MCHC 34.6  RDW 13.2  LYMPHSABS 5.0*  MONOABS 0.6  EOSABS 0.3  BASOSABS 0.0  BANDABS --    Chemistries   Lab 04/14/12 1623  NA 140  K 3.9  CL 102  CO2 32  GLUCOSE 65*  BUN 24*  CREATININE 0.97  CALCIUM 8.6  MG --  AST 13  ALT 6  ALKPHOS 53  BILITOT 0.2*   ------------------------------------------------------------------------------------------------------------------ estimated creatinine clearance is 73.3 ml/min (by C-G formula based on Cr of 0.97). ------------------------------------------------------------------------------------------------------------------  [facility] 04/14/12 2033  HGBA1C 8.3*   ------------------------------------------------------------------------------------------------------------------ No results found for this basename: CHOL:2,HDL:2,LDLCALC:2,TRIG:2,CHOLHDL:2,LDLDIRECT:2 in the last 72  hours ------------------------------------------------------------------------------------------------------------------ No results found for this basename: TSH,T4TOTAL,FREET3,T3FREE,THYROIDAB in the last 72 hours ------------------------------------------------------------------------------------------------------------------ No results found for this basename: VITAMINB12:2,FOLATE:2,FERRITIN:2,TIBC:2,IRON:2,RETICCTPCT:2 in the last 72 hours  Coagulation profile No results found for this basename: INR:5,PROTIME:5 in the last 168 hours  No results found for this basename: DDIMER:2 in the last 72 hours  Cardiac Enzymes No results found for this basename: CK:3,CKMB:3,TROPONINI:3,MYOGLOBIN:3 in the last 168 hours ------------------------------------------------------------------------------------------------------------------ No components found with this basename: POCBNP:3

## 2012-04-15 NOTE — Anesthesia Postprocedure Evaluation (Signed)
Anesthesia Post Note  [name]  Procedure(s) Performed: Procedure(s) (LRB): CLOSED REDUCTION ANKLE (Left)  Anesthesia type: general  Patient location: PACU  Post pain: Pain level controlled  Post assessment: Patient's Cardiovascular Status Stable  Last Vitals:  Filed Vitals:   04/15/12 1437  BP:   Pulse: 95  Temp: 38 C  Resp: 14    Post vital signs: Reviewed and stable  Level of consciousness: sedated  Complications: No apparent anesthesia complications

## 2012-04-15 NOTE — Transfer of Care (Signed)
Immediate Anesthesia Transfer of Care Note  [name]  Procedure(s) Performed: Procedure(s) (LRB) with comments: CLOSED REDUCTION ANKLE (Left)  Patient Location: PACU  Anesthesia Type:General  Level of Consciousness: awake and alert   Airway & Oxygen Therapy: Patient Spontanous Breathing and Patient connected to nasal cannula oxygen  Post-op Assessment: Report given to PACU RN and Post -op Vital signs reviewed and stable  Post vital signs: Reviewed and stable  Complications: No apparent anesthesia complications

## 2012-04-15 NOTE — ED Notes (Signed)
Pts depekote and zocor was received in CDU/ administered medications as ordered

## 2012-04-15 NOTE — Op Note (Signed)
04/14/2012 - 04/15/2012  2:31 PM  PATIENT:   Chloe Owens  56 y.o. female  PRE-OPERATIVE DIAGNOSIS:  left ankle fracture  POST-OPERATIVE DIAGNOSIS:  same  PROCEDURE:  Closed reduction and splinting left ankle fracture  SURGEON:  Tesean Stump, Vania Rea M.D.  ASSISTANTS: Shuford pacd   ANESTHESIA:   Mask general  EBL: none  SPECIMEN:  none  Drains: none   PATIENT DISPOSITION:  PACU - hemodynamically stable.    PLAN OF CARE: Admit to inpatient    Dictation# [id number]

## 2012-04-15 NOTE — Progress Notes (Signed)
Utilization review completed. Rondia Higginbotham, RN, BSN. 

## 2012-04-16 ENCOUNTER — Encounter (HOSPITAL_COMMUNITY): Payer: Self-pay | Admitting: Orthopedic Surgery

## 2012-04-16 DIAGNOSIS — E119 Type 2 diabetes mellitus without complications: Secondary | ICD-10-CM | POA: Diagnosis not present

## 2012-04-16 DIAGNOSIS — W010XXA Fall on same level from slipping, tripping and stumbling without subsequent striking against object, initial encounter: Secondary | ICD-10-CM | POA: Diagnosis not present

## 2012-04-16 DIAGNOSIS — S82899A Other fracture of unspecified lower leg, initial encounter for closed fracture: Secondary | ICD-10-CM | POA: Diagnosis not present

## 2012-04-16 LAB — CBC
HCT: 29.2 % — ABNORMAL LOW (ref 36.0–46.0)
Hemoglobin: 10 g/dL — ABNORMAL LOW (ref 12.0–15.0)
MCH: 30.6 pg (ref 26.0–34.0)
MCHC: 34.2 g/dL (ref 30.0–36.0)
MCV: 89.3 fL (ref 78.0–100.0)
Platelets: 144 10*3/uL — ABNORMAL LOW (ref 150–400)
RBC: 3.27 MIL/uL — ABNORMAL LOW (ref 3.87–5.11)
RDW: 12.9 % (ref 11.5–15.5)
WBC: 8.3 10*3/uL (ref 4.0–10.5)

## 2012-04-16 LAB — BASIC METABOLIC PANEL
BUN: 12 mg/dL (ref 6–23)
CO2: 30 mEq/L (ref 19–32)
Calcium: 8.4 mg/dL (ref 8.4–10.5)
Chloride: 99 mEq/L (ref 96–112)
Creatinine, Ser: 0.78 mg/dL (ref 0.50–1.10)
GFR calc Af Amer: >90 mL/min (ref >90)
GFR calc non Af Amer: >90 mL/min (ref >90)
Glucose, Bld: 249 mg/dL — ABNORMAL HIGH (ref 70–99)
Potassium: 3.7 mEq/L (ref 3.5–5.1)
Sodium: 136 mEq/L (ref 135–145)

## 2012-04-16 LAB — GLUCOSE, CAPILLARY
Glucose-Capillary: 261 mg/dL — ABNORMAL HIGH (ref 70–99)
Glucose-Capillary: 197 mg/dL — ABNORMAL HIGH (ref 70–99)
Glucose-Capillary: 192 mg/dL — ABNORMAL HIGH (ref 70–99)
Glucose-Capillary: 187 mg/dL — ABNORMAL HIGH (ref 70–99)

## 2012-04-16 MED ORDER — LISINOPRIL-HYDROCHLOROTHIAZIDE 20-12.5 MG PO TABS: 1.0000 | ORAL_TABLET | Freq: Every day | ORAL | Status: DC

## 2012-04-16 MED ORDER — INSULIN GLARGINE 100 UNIT/ML ~~LOC~~ SOLN
35.0000 [IU] | Freq: Every day | SUBCUTANEOUS | Status: DC
  Administered 2012-04-16: 35 [IU] via SUBCUTANEOUS

## 2012-04-16 MED ORDER — LISINOPRIL 20 MG PO TABS
20.0000 mg | ORAL_TABLET | Freq: Every day | ORAL | Status: DC
  Administered 2012-04-16 – 2012-04-17 (×2): 20 mg via ORAL
  Filled 2012-04-16 (×3): qty 1

## 2012-04-16 MED ORDER — HYDROCHLOROTHIAZIDE 12.5 MG PO CAPS
12.5000 mg | ORAL_CAPSULE | Freq: Every day | ORAL | Status: DC
  Administered 2012-04-16 – 2012-04-17 (×2): 12.5 mg via ORAL
  Filled 2012-04-16 (×3): qty 1

## 2012-04-16 NOTE — Progress Notes (Signed)
Chloe Owens  MRN: [id number] DOB/Age: September 22, 1955 56 y.o. Physician: Jacquelyne Balint Procedure: Procedure(s) (LRB): CLOSED REDUCTION ANKLE (Left)     Subjective: Sleeping, difficult to wake up but voices no complaints and nods back off as we are talking. Does move toes on command  Vital Signs Temp:  [98 F (36.7 C)-100.4 F (38 C)] 99.3 F (37.4 C) (11/12 0529) Pulse Rate:  [90-102] 90  (11/12 0529) Resp:  [14-20] 18  (11/12 0529) BP: (113-135)/(47-67) 135/52 mmHg (11/12 0529) SpO2:  [94 %-100 %] 100 % (11/12 0529)  Lab Results  [facility] 04/16/12 0515 04/14/12 1623  WBC 8.3 12.6*  HGB 10.0* 12.2  HCT 29.2* 35.3*  PLT 144* 189   BMET  Basename 04/16/12 0515 04/14/12 1623  NA 136 140  K 3.7 3.9  CL 99 102  CO2 30 32  GLUCOSE 249* 65*  BUN 12 24*  CREATININE 0.78 0.97  CALCIUM 8.4 8.6   INR  Date Value Range Status  05/25/2008 1.0  0.00 - 1.49 Final     Exam Splint in good condition        Plan L ankle fracture  Pt had significant swelling with fracture blisters and will need several days before surgery can be considered to allow skin to heal. She will likely need skilled care discharge for 24 hr assistance prior to and after surgery at her given activity levels. Plan is for her to follow up with Dr. Victorino Dike as outpatient Friday for exam and surgical discussion/planning if skin looks better. She should meet medicare criteria for ST skilled by tomorrow. Case MGr/SW for bedsearch  [facility] for Dr.Kevin Supple 04/16/2012, 1:33 PM

## 2012-04-16 NOTE — Evaluation (Signed)
Occupational Therapy Evaluation Patient Details Name: Chloe Owens MRN: [id number] DOB: [birth year] Today's Date: 04/16/2012 Time: 4098-1191 OT Time Calculation (min): 18 min  OT Assessment / Plan / Recommendation Clinical Impression  Pt 56 yo female s/p left closed reduction ankle fx.Pt very anxious during mobility and would benefit from OT acutely to increase independence with ADL's and mobility.    OT Assessment  Patient needs continued OT Services    Follow Up Recommendations  SNF    Barriers to Discharge Decreased caregiver support (husband works all day )    Engineer, agricultural with 5" wheels;3 in 1 bedside comode    Recommendations for Other Services    Frequency  Min 2X/week    Precautions / Restrictions Precautions Precautions: Fall Restrictions Weight Bearing Restrictions: Yes LLE Weight Bearing: Non weight bearing   Pertinent Vitals/Pain Pain during ambulation, never stated score    ADL  Eating/Feeding: Performed;Set up Where Assessed - Eating/Feeding: Chair Toilet Transfer: Simulated;Minimal assistance Toilet Transfer Method: Sit to stand Toilet Transfer Equipment: Other (comment) (chair to bed) Equipment Used: Gait belt;Rolling walker Transfers/Ambulation Related to ADLs: min (A) and cues for NWB status. Pt very anxious during ambulation ADL Comments: Pt in chair eating lunch on arrival. Pt willing to attempt to ambulate to bedside commode but once standing became very anxious and requested to get back into bed. Required mulitple vc's to slow down and for safe sequencing of RW. Required mod (A) this session to ambulate back to bed       OT Diagnosis: Generalized weakness;Acute pain;Cognitive deficits  OT Problem List: Decreased activity tolerance;Decreased safety awareness;Decreased cognition;Decreased knowledge of use of DME or AE;Decreased knowledge of precautions;Pain OT Treatment Interventions: Self-care/ADL training;Therapeutic  exercise;DME and/or AE instruction;Therapeutic activities;Patient/family education   OT Goals Acute Rehab OT Goals OT Goal Formulation: With patient Time For Goal Achievement: 04/30/12 Potential to Achieve Goals: Good ADL Goals Pt Will Perform Grooming: with supervision;Standing at sink ADL Goal: Grooming - Progress: Goal set today Pt Will Perform Lower Body Bathing: with supervision;Sit to stand from chair ADL Goal: Lower Body Bathing - Progress: Goal set today Pt Will Perform Lower Body Dressing: with supervision;Sit to stand from chair ADL Goal: Lower Body Dressing - Progress: Goal set today Pt Will Transfer to Toilet: with supervision;Ambulation;Raised toilet seat with arms;Maintaining weight bearing status ADL Goal: Toilet Transfer - Progress: Goal set today Pt Will Perform Toileting - Clothing Manipulation: with supervision;Standing ADL Goal: Toileting - Clothing Manipulation - Progress: Goal set today Pt Will Perform Toileting - Hygiene: with supervision;Leaning right and/or left on 3-in-1/toilet ADL Goal: Toileting - Hygiene - Progress: Goal set today  Visit Information  Last OT Received On: 04/16/12 Assistance Needed: +1    Subjective Data  Subjective: "It is just itching"-referring to LLE Patient Stated Goal: "not sure"   Prior Functioning     Home Living Lives With: Spouse Available Help at Discharge: Family;Available PRN/intermittently Type of Home: House Home Access: Stairs to enter [employer] of Steps: 4 Entrance Stairs-Rails: Left Home Layout: One level Bathroom Shower/Tub: Walk-in Contractor: Standard Bathroom Accessibility:  (unsure ) Home Adaptive Equipment: None Prior Function Level of Independence: Independent Able to Take Stairs?: Yes Driving: Yes Communication Communication: No difficulties Dominant Hand: Right         Vision/Perception     Cognition  Overall Cognitive Status: Impaired Area of  Impairment: Memory;Safety/judgement Arousal/Alertness: Awake/alert Orientation Level: Oriented X4 / Intact Behavior During Session: Anxious Current Attention Level: Selective Memory:  Decreased recall of precautions Memory Deficits: Required physical (A) to keep weight off LLE  Following Commands: Follows multi-step commands inconsistently;Follows one step commands with increased time Safety/Judgement: Decreased awareness of safety precautions;Decreased safety judgement for tasks assessed;Impulsive Problem Solving: Slow to process and initiate    Extremity/Trunk Assessment Right Upper Extremity Assessment RUE ROM/Strength/Tone: [facility] for tasks assessed Left Upper Extremity Assessment LUE ROM/Strength/Tone: WFL for tasks assessed Right Lower Extremity Assessment RLE ROM/Strength/Tone: WFL for tasks assessed RLE Sensation: WFL - Light Touch Left Lower Extremity Assessment LLE ROM/Strength/Tone: Deficits LLE ROM/Strength/Tone Deficits: L ankle fx in cast.  Able to lift LE Independently LLE Sensation: WFL - Light Touch Trunk Assessment Trunk Assessment: Normal     Mobility Bed Mobility Bed Mobility: Sit to Supine;Scooting to HOB Supine to Sit: 4: Min guard;With rails Sitting - Scoot to Edge of Bed: 4: Min guard Sit to Supine: 5: Supervision;HOB flat Scooting to HOB: 5: Supervision Details for Bed Mobility Assistance: cues for sequencing and safe technique Transfers Transfers: Sit to Stand;Stand to Sit Sit to Stand: 4: Min assist;With upper extremity assist;From chair/3-in-1;With armrests Stand to Sit: 4: Min assist;With upper extremity assist;To bed Details for Transfer Assistance: Pt very impulsive during transfer. Pt attempted to sit befoe reaching the bed. Needed cueing for safe hand placement, technique and slow controlled descent                Balance Balance Balance Assessed: Yes Static Standing Balance Static Standing - Balance Support: Bilateral upper extremity  supported Static Standing - Level of Assistance: 4: Min assist Static Standing - Comment/# of Minutes: pt unsteadya nd needs cueing for proper use of RW and focusing on task.     End of Session OT - End of Session Equipment Utilized During Treatment: Gait belt Activity Tolerance: Patient limited by pain Patient left: in bed;with call bell/phone within reach Nurse Communication: Mobility status  GO     Cleora Fleet 04/16/2012, 12:40 PM

## 2012-04-16 NOTE — Op Note (Signed)
NAMEAKELA, POCIUS NO.:  [id number]  MEDICAL RECORD NO.:  [id number]  LOCATION:  5N31C                        FACILITY:  [facility]  PHYSICIAN:  Vania Rea. Lucita Montoya, M.D.  DATE OF BIRTH:  11/03/1955  DATE OF PROCEDURE: DATE OF DISCHARGE:                              OPERATIVE REPORT   PREOPERATIVE DIAGNOSIS:  Displaced left bimalleolar ankle fracture dislocation.  POSTOPERATIVE DIAGNOSIS:  Displaced left bimalleolar ankle fracture dislocation.  PROCEDURE:  Closed reduction and splinting of displaced left bimalleolar ankle fracture dislocation.  SURGEON:  Vania Rea. Brook [place], M.D.  Threasa HeadsFrench Ana A. Shuford, PA-C  ANESTHESIA:  Masked general.  HISTORY:  Ms. Frumkin is a 56 year old female who sustained a ground level fall at home this past Sunday morning 1 day ago and had immediate complaints of pain and inability to bear weight in the left lower extremity.  She has taken by EMS to the [facility] in [place] where she was found to have a left ankle fracture dislocation.  Two attempts had been made by the emergency department staff at [facility] to achieve an appropriate closed reduction, but subsequent x-ray showed continuous significant subluxation of the joint as well as marked swelling of the ankle.  She was subsequently transferred to the [facility] Emergency Room for further evaluation and treatment.  On initial presentation, she was noted to be somewhat sedated/confused and there was significantly hypotensive and hypoglycemic.  These issues were medically stabilized by the emergency room staff and the [facility].  Of note, her ankle did show significant continued lateral deformity with several layers of splint around the ankle, which somewhat obscured the view.  In the emergency room last night, I removed her splint and attempted a repeat reduction, but was unable to gain enough relaxation for a complete reduction of ankle.  Of  note, however at that time some initial fracture blisters were noted to be developing about the ankle.  Due to the progression of fracture, blisters displacement and swelling it was felt that she would benefit from a closed reduction under anesthesia and anticipate return to the operating within the next 1-2 weeks for formal open reduction and internal fixation.  Briefly, I counseled Ms. Geisler as well as her family members regarding treatment options and risks versus benefits thereof.  Possible surgical complications were reviewed with potential for persistent subluxation of the joint and in addition the ultimate need for ORIF and potential anesthetic complication.  They understand and accept and agreed to plan for reduction and splint.  PROCEDURE IN DETAIL:  After undergoing routine preop evaluation, the patient was brought to the operating room table, underwent smooth induction of mask general anesthesia.  After appropriate relaxation, her splint was removed.  We used fluoroscopic imaging to confirm that we could achieve good alignment at the fracture site, which we did achieve with inversion of the ankle and hindfoot.  At this point, we did inspect the skin and there was a very large fracture blister medially, posteriorly, as well as laterally.  The largest was medial approximately 4 x 10 cm and multiple other small fracture blisters about the posterior and lateral aspect of the  ankle and given these changes certainly she would not need be a candidate for any type of ORIF.  We placed Adaptic gauze over the ankle region around the fracture blisters and then used 3 ABD pads for additional padding. Stockinette was then applied and then Webril was wrapped from the foot to the knee with multiple layers for appropriate padding.  I then placed a plaster stirrup splint, followed by an Ace bandage and maintained the ankle and hindfoot in inverted position to maintain alignment.  Once  the plaster had appropriately hardened, we then obtained final fluoroscopic image, which showed good alignment at the ankle joint.  At this point, then the patient was awakened and transferred to the recovery room in stable condition.  Tracy A. Shuford, P.A.-C., was used as an Geophysicist/field seismologist for this case, essential for help with positioning of extremity, management of the reduction, cast, and splint application.     Vania Rea. Philisha Weinel, M.D.     KMS/MEDQ  D:  04/15/2012  T:  04/16/2012  Job:  [id number]

## 2012-04-16 NOTE — Evaluation (Signed)
I agree with the following treatment note after reviewing documentation.   Johnston, Veeda Virgo Brynn   OTR/L Pager: [phone] Office: [phone] .   

## 2012-04-16 NOTE — Progress Notes (Signed)
[facility]                                                                                Patient Demographics  Chloe Owens, is a 56 y.o. female  LKG:[id number]  GUY:[id number]  DOB - 01/30/1956  Admit date - 04/14/2012  Admitting Physician Senaida Lange, MD  Outpatient Primary MD for the patient is No primary provider on file.  LOS - 2   Chief Complaint  Patient presents with  . Ankle Pain        Assessment & Plan    1. Left Ankle Fx: Secondary to trip and fall, Ortho: Primary physician orthopedic surgeon Dr. Kirtland Bouchard.Supple post surgery on 04/16/2011 , discuss with patient's husband, commence PT OT, DVT prophylaxis per primary team.    2. Hypoglycemia - DM-2 -  not taking oral diet, will place her on Lantus along with sliding scale insulin  Lab Results  Component Value Date   HGBA1C 8.3* 04/14/2012   CBG (last 3)   Basename 04/16/12 0647 04/15/12 2154 04/15/12 1442  GLUCAP 261* 201* 94      3. Hypotension- resolved, monitor blood pressure have adjusted her medications and is resume her home dose diuretic along with lisinopril.     4. Hyperlipidemia- continue Simvastatin      5. CVA hx -stable , has was baseline memory issues and confusion on and off per her husband, resumed aspirin along with statin no acute issues.     Time Spent in minutes   35   Antibiotics    Anti-infectives    None      Scheduled Meds:    . ALPRAZolam  0.5 mg Oral BID  . aspirin EC  81 mg Oral Daily  . divalproex  500 mg Oral BID  . docusate sodium  100 mg Oral BID  . enoxaparin (LOVENOX) injection  40 mg Subcutaneous Q24H  . escitalopram  20 mg Oral BID  . folic acid  1 mg Oral Daily  . insulin aspart  0-15 Units Subcutaneous TID WC  . insulin glargine  35 Units Subcutaneous Daily  . lisinopril-hydrochlorothiazide  1 tablet Oral Daily  . [EXPIRED] morphine      . pantoprazole  40 mg Oral QHS  . simvastatin  40 mg Oral QPM  . vitamin B-12  1,000  mcg Oral Daily  . [DISCONTINUED] divalproex  500 mg Oral BID  . [DISCONTINUED] insulin aspart  0-9 Units Subcutaneous Q4H  . [DISCONTINUED] insulin glargine  20 Units Subcutaneous Daily  . [DISCONTINUED] insulin glargine  20 Units Subcutaneous Daily  . [DISCONTINUED] insulin glargine  40 Units Subcutaneous Daily  . [DISCONTINUED] pantoprazole  40 mg Oral Daily   Continuous Infusions:    . [DISCONTINUED] dextrose 5 % and 0.9% NaCl 75 mL/hr at 04/15/12 1043  . [DISCONTINUED] lactated ringers     PRN Meds:.bisacodyl, diphenhydrAMINE, HYDROcodone-acetaminophen, HYDROmorphone (DILAUDID) injection, metoCLOPramide (REGLAN) injection, metoCLOPramide, metoprolol, morphine injection, ondansetron (ZOFRAN) IV, ondansetron, polyethylene glycol, [EXPIRED] sodium phosphate   DVT Prophylaxis  Per ortho -primary team  Lab Results  Component Value Date   PLT 144* 04/16/2012      Susa Raring K M.D on 04/16/2012 at  11:38 AM  Between 7am to 7pm - Pager - [phone]  After 7pm go to [email or web address] - password [email or web address]  And look for the night coverage person covering for me after hours  [facility] Office  [phone]    Subjective:   Chloe Owens today has, No headache, No chest pain, No abdominal pain - No Nausea, No new weakness tingling or numbness, No Cough - SOB.    Objective:   Filed Vitals:   04/15/12 1640 04/15/12 2130 04/16/12 0141 04/16/12 0529  BP: 126/47 113/51 127/61 135/52  Pulse: 102 99 96 90  Temp: 99 F (37.2 C) 99.8 F (37.7 C) 98.8 F (37.1 C) 99.3 F (37.4 C)  TempSrc:      Resp: 18 20 18 18   Height:      Weight:      SpO2: 94% 97% 95% 100%    Wt Readings from Last 3 Encounters:  04/15/12 87.68 kg (193 lb 4.8 oz)  04/15/12 87.68 kg (193 lb 4.8 oz)     Intake/Output Summary (Last 24 hours) at 04/16/12 1138 Last data filed at 04/15/12 1700  Gross per 24 hour  Intake    900 ml  Output    200 ml  Net    700 ml    Exam Awake Alert,  Oriented X 1, No new F.N deficits, Normal affect [place].AT,PERRAL Supple Neck,No JVD, No cervical lymphadenopathy appriciated.  Symmetrical Chest wall movement, Good air movement bilaterally, CTAB RRR,No Gallops,Rubs or new Murmurs, No Parasternal Heave +ve B.Sounds, Abd Soft, Non tender, No organomegaly appriciated, No rebound - guarding or rigidity. No Cyanosis, Clubbing or edema, No new Rash or bruise , L leg in splint   Data Review   Micro Results Recent Results (from the past 240 hour(s))  CULTURE, BLOOD (ROUTINE X 2)     Status: Normal (Preliminary result)   Collection Time   04/14/12  8:25 PM      Component Value Range Status Comment   Specimen Description BLOOD LEFT HAND   Final    Special Requests BOTTLES DRAWN AEROBIC ONLY 5CC   Final    Culture  Setup Time 04/15/2012 02:22   Final    Culture     Final    Value:        BLOOD CULTURE RECEIVED NO GROWTH TO DATE CULTURE WILL BE HELD FOR 5 DAYS BEFORE ISSUING A FINAL NEGATIVE REPORT   Report Status PENDING   Incomplete   CULTURE, BLOOD (ROUTINE X 2)     Status: Normal (Preliminary result)   Collection Time   04/14/12  8:35 PM      Component Value Range Status Comment   Specimen Description BLOOD RIGHT HAND   Final    Special Requests BOTTLES DRAWN AEROBIC ONLY 3CC   Final    Culture  Setup Time 04/15/2012 02:22   Final    Culture     Final    Value:        BLOOD CULTURE RECEIVED NO GROWTH TO DATE CULTURE WILL BE HELD FOR 5 DAYS BEFORE ISSUING A FINAL NEGATIVE REPORT   Report Status PENDING   Incomplete   SURGICAL PCR SCREEN     Status: Normal   Collection Time   04/15/12  8:11 AM      Component Value Range Status Comment   MRSA, PCR NEGATIVE  NEGATIVE Final    Staphylococcus aureus NEGATIVE  NEGATIVE Final     Radiology Reports Dg Ankle Complete Left  04/15/2012  *  RADIOLOGY REPORT*  Clinical Data: Ankle fracture with reduction and casting  LEFT ANKLE COMPLETE - 3+ VIEW  Comparison: 04/14/2012  Findings: Casting material  is again identified.  There are again seen distal tibial and fibular fractures with some displacement which is stable in appearance.  There is some lateral displacement of the talus with respect to the distal tibia.  IMPRESSION: Persistent distal tibial and fibular fractures with casting material.  The overall appearance is stable from prior exam.   Original Report Authenticated By: Alcide Clever, M.D.    Dg Ankle Complete Left  04/14/2012  *RADIOLOGY REPORT*  Clinical Data: Postreduction  LEFT ANKLE COMPLETE - 3+ VIEW  Comparison:  Prior films same day  Findings: Study is limited by casting material artifact.  Again noted displaced fracture of distal tibia and fibula with disruption of the ankle mortise.  No significant change in alignment.  IMPRESSION: Again noted displaced fracture of distal tibia and fibula with disruption of the ankle mortise.  No significant change in alignment.   Original Report Authenticated By: Natasha Mead, M.D.    Dg Ankle Complete Left  04/14/2012  *RADIOLOGY REPORT*  Clinical Data: Ankle pain  LEFT ANKLE COMPLETE - 3+ VIEW  Comparison: None.  Findings: Three views of the left ankle submitted.  There is displaced comminuted fracture of the distal tibia medial malleolus. Oblique displaced fracture of the distal left fibula.  Significant disruption of the ankle mortise with medial displacement of the distal tibia.  IMPRESSION: There is displaced comminuted fracture of the distal tibia medial malleolus.  Oblique displaced fracture of the distal left fibula. Significant disruption of the ankle mortise with medial displacement of the distal tibia.   Original Report Authenticated By: Natasha Mead, M.D.    Dg Chest Portable 1 View  04/14/2012  *RADIOLOGY REPORT*  Clinical Data: Ankle pain, preop  PORTABLE CHEST - 1 VIEW  Comparison: None.  Findings: Cardiomediastinal silhouette is unremarkable.  Streaky right basilar atelectasis or infiltrate.  No pulmonary edema.  IMPRESSION: No pulmonary  edema.  Streaky right basilar atelectasis or infiltrate.   Original Report Authenticated By: Natasha Mead, M.D.     CBC  Lab 04/16/12 0515 04/14/12 1623  WBC 8.3 12.6*  HGB 10.0* 12.2  HCT 29.2* 35.3*  PLT 144* 189  MCV 89.3 88.7  MCH 30.6 30.7  MCHC 34.2 34.6  RDW 12.9 13.2  LYMPHSABS -- 5.0*  MONOABS -- 0.6  EOSABS -- 0.3  BASOSABS -- 0.0  BANDABS -- --    Chemistries   Lab 04/16/12 0515 04/14/12 1623  NA 136 140  K 3.7 3.9  CL 99 102  CO2 30 32  GLUCOSE 249* 65*  BUN 12 24*  CREATININE 0.78 0.97  CALCIUM 8.4 8.6  MG -- --  AST -- 13  ALT -- 6  ALKPHOS -- 53  BILITOT -- 0.2*   ------------------------------------------------------------------------------------------------------------------ estimated creatinine clearance is 88.8 ml/min (by C-G formula based on Cr of 0.78). ------------------------------------------------------------------------------------------------------------------  [facility] 04/14/12 2033  HGBA1C 8.3*   ------------------------------------------------------------------------------------------------------------------ No results found for this basename: CHOL:2,HDL:2,LDLCALC:2,TRIG:2,CHOLHDL:2,LDLDIRECT:2 in the last 72 hours ------------------------------------------------------------------------------------------------------------------ No results found for this basename: TSH,T4TOTAL,FREET3,T3FREE,THYROIDAB in the last 72 hours ------------------------------------------------------------------------------------------------------------------ No results found for this basename: VITAMINB12:2,FOLATE:2,FERRITIN:2,TIBC:2,IRON:2,RETICCTPCT:2 in the last 72 hours  Coagulation profile No results found for this basename: INR:5,PROTIME:5 in the last 168 hours  No results found for this basename: DDIMER:2 in the last 72 hours  Cardiac Enzymes No results found for this basename: CK:3,CKMB:3,TROPONINI:3,MYOGLOBIN:3 in the last 168  hours ------------------------------------------------------------------------------------------------------------------ No components found with this basename: POCBNP:3

## 2012-04-16 NOTE — Evaluation (Signed)
Physical Therapy Evaluation Patient Details Name: Chloe Owens MRN: [id number] DOB: 1956/04/19 Today's Date: 04/16/2012 Time: 4098-1191 PT Time Calculation (min): 27 min  PT Assessment / Plan / Recommendation Clinical Impression  pt presents with L ankle fx.  pt with some difficulty with memory and attending to task today.  pt unsteady with mobility and notes husband works 12hr days and that she has a friend who might be able to check-in on her daily, but very limited A at home.  Pending pt's progress and LOS, may need to consider SNF for safest D/C plan.      PT Assessment  Patient needs continued PT services    Follow Up Recommendations  SNF    Does the patient have the potential to tolerate intense rehabilitation      Barriers to Discharge Decreased caregiver support      Equipment Recommendations  Rolling walker with 5" wheels;3 in 1 bedside comode    Recommendations for Other Services     Frequency Min 5X/week    Precautions / Restrictions Precautions Precautions: Fall Restrictions Weight Bearing Restrictions: Yes LLE Weight Bearing: Non weight bearing   Pertinent Vitals/Pain Indicates painful with mobility, but never rated when asked.        Mobility  Bed Mobility Bed Mobility: Supine to Sit;Sitting - Scoot to Edge of Bed Supine to Sit: 4: Min guard;With rails Sitting - Scoot to Edge of Bed: 4: Min guard Details for Bed Mobility Assistance: cues for sequencing and safe technique Transfers Transfers: Sit to Stand;Stand to Sit Sit to Stand: 4: Min assist;With upper extremity assist;From bed Stand to Sit: 4: Min assist;Without upper extremity assist;To chair/3-in-1 Details for Transfer Assistance: cues for safe technique, use of UEs, controlling descent to chair, attending to task.   Ambulation/Gait Ambulation/Gait Assistance: 4: Min assist Ambulation Distance (Feet): 5 Feet Assistive device: Rolling walker Ambulation/Gait Assistance Details: cues for use of  RW, NWBing L LE, sequencing gait, encouragement, A with moving RW.   Gait Pattern: Step-to pattern Stairs: No Wheelchair Mobility Wheelchair Mobility: No    Shoulder Instructions     Exercises     PT Diagnosis: Difficulty walking;Acute pain  PT Problem List: Decreased strength;Decreased activity tolerance;Decreased balance;Decreased mobility;Decreased cognition;Decreased knowledge of use of DME;Decreased knowledge of precautions;Pain PT Treatment Interventions: DME instruction;Gait training;Stair training;Functional mobility training;Therapeutic activities;Therapeutic exercise;Balance training;Patient/family education   PT Goals Acute Rehab PT Goals PT Goal Formulation: With patient Time For Goal Achievement: 04/30/12 Potential to Achieve Goals: Good Pt will go Supine/Side to Sit: with modified independence PT Goal: Supine/Side to Sit - Progress: Goal set today Pt will go Sit to Supine/Side: with modified independence PT Goal: Sit to Supine/Side - Progress: Goal set today Pt will go Sit to Stand: with modified independence PT Goal: Sit to Stand - Progress: Goal set today Pt will go Stand to Sit: with modified independence PT Goal: Stand to Sit - Progress: Goal set today Pt will Ambulate: 51 - 150 feet;with modified independence;with rolling walker PT Goal: Ambulate - Progress: Goal set today Pt will Go Up / Down Stairs: 3-5 stairs;with min assist;with rolling walker PT Goal: Up/Down Stairs - Progress: Goal set today  Visit Information  Last PT Received On: 04/16/12 Assistance Needed: +1    Subjective Data  Subjective: Do you see this getting any better? Patient Stated Goal: Home   Prior Functioning  Home Living Lives With: Spouse Available Help at Discharge: Family;Available PRN/intermittently (Husband works 12hr days, has a friend who can check-in daily)  Type of Home: House Home Access: Stairs to enter [employer] of Steps: 4 Entrance Stairs-Rails:  Left Home Layout: One level Home Adaptive Equipment: None Prior Function Level of Independence: Independent Able to Take Stairs?: Yes Driving: Yes Communication Communication: No difficulties    Cognition  Overall Cognitive Status: Impaired Area of Impairment: Memory;Attention;Following commands;Problem solving Arousal/Alertness: Awake/alert Orientation Level: Disoriented to;Time Behavior During Session: Flat affect Current Attention Level: Selective Memory Deficits: pt does not recall when she broke her ankle and often needs cues repeated.   Following Commands: Follows multi-step commands inconsistently;Follows one step commands with increased time Problem Solving: Slow to process and initiate    Extremity/Trunk Assessment Right Lower Extremity Assessment RLE ROM/Strength/Tone: WFL for tasks assessed RLE Sensation: WFL - Light Touch Left Lower Extremity Assessment LLE ROM/Strength/Tone: Deficits LLE ROM/Strength/Tone Deficits: L ankle fx in cast.  Able to lift LE Independently LLE Sensation: WFL - Light Touch Trunk Assessment Trunk Assessment: Normal   Balance Balance Balance Assessed: Yes Static Standing Balance Static Standing - Balance Support: Bilateral upper extremity supported Static Standing - Level of Assistance: 4: Min assist Static Standing - Comment/# of Minutes: pt unsteadya nd needs cueing for proper use of RW and focusing on task.    End of Session PT - End of Session Equipment Utilized During Treatment: Gait belt Activity Tolerance: Patient limited by pain Patient left: in chair;with call bell/phone within reach Nurse Communication: Mobility status  GP     Sunny Schlein, [place] [phone] 04/16/2012, 9:23 AM

## 2012-04-16 NOTE — ED Provider Notes (Signed)
Medical screening examination/treatment/procedure(s) were performed by non-physician practitioner and as supervising physician I was immediately available for consultation/collaboration.   Charles B. Bernette Mayers, MD 04/16/12 [phone]

## 2012-04-17 DIAGNOSIS — S82899A Other fracture of unspecified lower leg, initial encounter for closed fracture: Secondary | ICD-10-CM | POA: Diagnosis not present

## 2012-04-17 DIAGNOSIS — S8990XA Unspecified injury of unspecified lower leg, initial encounter: Secondary | ICD-10-CM | POA: Diagnosis not present

## 2012-04-17 DIAGNOSIS — E119 Type 2 diabetes mellitus without complications: Secondary | ICD-10-CM | POA: Diagnosis not present

## 2012-04-17 DIAGNOSIS — W010XXA Fall on same level from slipping, tripping and stumbling without subsequent striking against object, initial encounter: Secondary | ICD-10-CM | POA: Diagnosis not present

## 2012-04-17 LAB — GLUCOSE, CAPILLARY
Glucose-Capillary: 167 mg/dL — ABNORMAL HIGH (ref 70–99)
Glucose-Capillary: 199 mg/dL — ABNORMAL HIGH (ref 70–99)

## 2012-04-17 MED ORDER — INSULIN GLARGINE 100 UNIT/ML ~~LOC~~ SOLN: 40.0000 [IU] | Freq: Every day | SUBCUTANEOUS | Status: DC

## 2012-04-17 MED ORDER — INSULIN GLARGINE 100 UNIT/ML ~~LOC~~ SOLN: 45.0000 [IU] | Freq: Every day | SUBCUTANEOUS | Status: DC

## 2012-04-17 MED ORDER — INSULIN GLARGINE 100 UNIT/ML ~~LOC~~ SOLN: 10.0000 [IU] | Freq: Once | SUBCUTANEOUS | Status: DC

## 2012-04-17 MED ORDER — POLYETHYLENE GLYCOL 3350 17 G PO PACK: 17.0000 g | PACK | Freq: Every day | ORAL | Status: DC

## 2012-04-17 MED ORDER — INSULIN GLARGINE 100 UNIT/ML ~~LOC~~ SOLN
35.0000 [IU] | Freq: Once | SUBCUTANEOUS | Status: AC
  Administered 2012-04-17: 35 [IU] via SUBCUTANEOUS

## 2012-04-17 MED ORDER — INSULIN GLARGINE 100 UNIT/ML ~~LOC~~ SOLN
5.0000 [IU] | Freq: Once | SUBCUTANEOUS | Status: AC
  Administered 2012-04-17: 5 [IU] via SUBCUTANEOUS

## 2012-04-17 MED ORDER — HYDROCODONE-ACETAMINOPHEN 5-325 MG PO TABS: 1.0000 | ORAL_TABLET | ORAL | Status: DC | PRN

## 2012-04-17 NOTE — Discharge Summary (Signed)
  Physician Discharge Summary  Patient ID: Chloe Owens MRN: [id number] DOB/AGE: 1955/07/16 56 y.o.  Admit date: 04/14/2012 Discharge date: 04/17/2012  Admission Diagnoses: Left ankle fracture dislocation  Discharge Diagnoses: same  Discharged Condition: good  Hospital Course: Pt was admitted and underwent a closed reduction of her ankle fracture. She had significant fracture blisters and swelling that would require soft tissue healing prior to definitive fracture management at a later date. Her home situation was such that she would require assistance around the clock for safety so skilled bed search was initiated. She remained stable orthopaedically and medicine who consulted on patient thought patient was stable for Dc to skilled and for surgery on a more elective basis once skin was quiescent.  Consults: Internal Medicine/hospitalist   Operative Procedure: Procedure(s): CLOSED REDUCTION ANKLEON11/03/2012 - 04/15/2012  Disposition:   Discharge Orders    Future Orders Please Complete By Expires   Non weight bearing      Scheduling Instructions:   LLE Elevate on pillows and encourage ROM to toes       Medication List     As of 04/17/2012 12:27 PM    TAKE these medications         aspirin EC 81 MG tablet   Take 81 mg by mouth daily.      divalproex 500 MG 24 hr tablet   Commonly known as: DEPAKOTE ER   Take 500 mg by mouth 2 (two) times daily.      escitalopram 20 MG tablet   Commonly known as: LEXAPRO   Take 20 mg by mouth 2 (two) times daily.      folic acid 1 MG tablet   Commonly known as: FOLVITE   Take 1 mg by mouth daily.      HYDROcodone-acetaminophen 5-325 MG per tablet   Commonly known as: NORCO/VICODIN   Take 1-2 tablets by mouth every 4 (four) hours as needed.      insulin glargine 100 UNIT/ML injection   Commonly known as: LANTUS   Inject 40 Units into the skin daily.      lisinopril-hydrochlorothiazide 20-12.5 MG per tablet   Commonly known  as: PRINZIDE,ZESTORETIC   Take 1 tablet by mouth daily.      omeprazole 20 MG capsule   Commonly known as: PRILOSEC   Take 20 mg by mouth daily.      polyethylene glycol packet   Commonly known as: MIRALAX / GLYCOLAX   Take 17 g by mouth daily.      simvastatin 40 MG tablet   Commonly known as: ZOCOR   Take 40 mg by mouth every evening.      vitamin B-12 1000 MCG tablet   Commonly known as: CYANOCOBALAMIN   Take 1,000 mcg by mouth daily.           Follow-up Information    Follow up with Toni Arthurs, MD. (arrange transportation for Friday 04/19/12 at 2 pm)    Contact information:   [address] [place] [address] [phone]          Discharge Instructions:  Follow up with Dr. Victorino Dike at 2pm this Friday Continue Non weight bearing to LLE and elevate on pillows Keep splint clean and dry Signed: [facility] 04/17/2012, 12:27 PM

## 2012-04-17 NOTE — Progress Notes (Signed)
Pt discharged to [facility]. Report called to RN at [facility]. IV removed. Pt left unit in stable condition via EMS.

## 2012-04-17 NOTE — Clinical Social Work Placement (Addendum)
    Clinical Social Work Department CLINICAL SOCIAL WORK PLACEMENT NOTE 04/17/2012  Patient:  Chloe Owens, Chloe Owens  Account Number:  [id number] Admit date:  04/14/2012  Clinical Social Worker:  Lupita Leash Buford Bremer, LCSWA  Date/time:  04/15/2012 05:00 PM  Clinical Social Work is seeking post-discharge placement for this patient at the following level of care:   SKILLED NURSING   (*CSW will update this form in Epic as items are completed)   04/15/2012  Patient/family provided with [facility] Department of Clinical Social Work's list of facilities offering this level of care within the geographic area requested by the patient (or if unable, by the patient's family).  04/15/2012  Patient/family informed of their freedom to choose among providers that offer the needed level of care, that participate in Medicare, Medicaid or managed care program needed by the patient, have an available bed and are willing to accept the patient.  04/15/2012  Patient/family informed of [facility]' ownership interest in [facility], as well as of the fact that they are under no obligation to receive care at this facility.  PASARR submitted to EDS on 04/15/2012 PASARR number received from [employer] on 04/16/2012  FL2 transmitted to all facilities in geographic area requested by pt/family on  04/15/2012 FL2 transmitted to all facilities within larger geographic area on   Patient informed that his/her managed care company has contracts with or will negotiate with  certain facilities, including the following:   na     Patient/family informed of bed offers received:  04/16/12 Patient chooses bed at [facility] Physician recommends and patient chooses bed at    Patient to be transferred to [facility]  on  04/17/13 Patient to be transferred to facility by Ambulance  The following physician request were entered in Epic:   Additional Comments:  DC completed by Sabino Niemann, LCSWA- (CSW  Coverage). Lorri Frederick. [place]  [phone]

## 2012-04-17 NOTE — Progress Notes (Signed)
Physical Therapy Treatment Patient Details Name: Chloe Owens MRN: [id number] DOB: July 04, 1955 Today's Date: 04/17/2012 Time: 4401-0272 PT Time Calculation (min): 18 min  PT Assessment / Plan / Recommendation Comments on Treatment Session  Patient continues to be limited by anxiety. She was shaking throughout session and states she very nervous. Continue to recommend SNF as unable to care for herself while husband is at work.     Follow Up Recommendations  SNF     Does the patient have the potential to tolerate intense rehabilitation     Barriers to Discharge        Equipment Recommendations  Rolling walker with 5" wheels;3 in 1 bedside comode    Recommendations for Other Services    Frequency Min 5X/week   Plan Discharge plan remains appropriate;Frequency remains appropriate    Precautions / Restrictions Precautions Precautions: Fall Restrictions Weight Bearing Restrictions: Yes LLE Weight Bearing: Non weight bearing   Pertinent Vitals/Pain     Mobility  Bed Mobility Supine to Sit: 4: Min guard Sitting - Scoot to Edge of Bed: 4: Min guard Transfers Sit to Stand: 4: Min assist;With upper extremity assist;From bed;From chair/3-in-1 Stand to Sit: 4: Min assist;With upper extremity assist;To chair/3-in-1 Details for Transfer Assistance: Patient not as impulsive today with transfer. Very anxious about transfer and requiring cues for safe technique and A for stability Ambulation/Gait Ambulation/Gait Assistance: 4: Min assist Ambulation Distance (Feet): 5 Feet Assistive device: Rolling walker Ambulation/Gait Assistance Details: Cues for NWBing and for RW management. A for balance Gait Pattern: Step-to pattern    Exercises     PT Diagnosis:    PT Problem List:   PT Treatment Interventions:     PT Goals Acute Rehab PT Goals PT Goal: Supine/Side to Sit - Progress: Progressing toward goal PT Goal: Sit to Stand - Progress: Progressing toward goal PT Goal: Stand to Sit  - Progress: Progressing toward goal PT Goal: Ambulate - Progress: Progressing toward goal  Visit Information  Last PT Received On: 04/17/12 Assistance Needed: +1    Subjective Data      Cognition  Overall Cognitive Status: Appears within functional limits for tasks assessed/performed Arousal/Alertness: Awake/alert Orientation Level: Appears intact for tasks assessed Behavior During Session: Anxious Cognition - Other Comments: Patient responding more quickly today.     Balance     End of Session PT - End of Session Equipment Utilized During Treatment: Gait belt Activity Tolerance: Patient tolerated treatment well;Other (comment) (appears anxious) Patient left: in chair;with call bell/phone within reach Nurse Communication: Mobility status   GP     Fredrich Birks 04/17/2012, 11:48 AM  04/17/2012 Fredrich Birks PTA [phone] pager [phone] office

## 2012-04-17 NOTE — Progress Notes (Signed)
Bed offer in place from [facility] [place] and son has signed admit papers at facility.  Awaiting MD's decision regarding stability and d/c.  PASARR review in effect and further clinicals requested and sent. Also awaiting PASARR number.  Anticipate receiving number today.  Lorri Frederick. [place]  [phone]

## 2012-04-17 NOTE — Clinical Social Work Psychosocial (Addendum)
    Clinical Social Work Department BRIEF PSYCHOSOCIAL ASSESSMENT 04/17/2012  Patient:  Chloe Owens, Chloe Owens     Account Number:  [id number]     Admit date:  04/14/2012  Clinical Social Worker:  Tiburcio Pea  Date/Time:  04/15/2012 04:30 PM  Referred by:  Physician  Date Referred:  04/15/2012 Referred for  SNF Placement   Other Referral:   Interview type:  Other - See comment Other interview type:   Patient, husband and son    PSYCHOSOCIAL DATA Living Status:  HUSBAND Admitted from facility:   Level of care:   Primary support name:  Chloe Owens Primary support relationship to patient:  SPOUSE Degree of support available:   Strong support    CURRENT CONCERNS Current Concerns  Post-Acute Placement   Other Concerns:    SOCIAL WORK ASSESSMENT / PLAN Patient underwent ankle surgery this afternoon. Met with pateint's husband and son today- they are requesting short term SNF for patient as husband works and son is a school Child psychotherapist.  They prefer [facility] if possible as they already have several family members there. Bed search initiated in [place]. Bed search process discussed and family agreed.   spoke to [place]- Admissions at [facility]- she will be looking for patient's referral.   Assessment/plan status:  Psychosocial Support/Ongoing Assessment of Needs Other assessment/ plan:   Information/referral to community resources:   SNF bed list provided  After care needs discussed- to be arranged by SNF prior to d/c home.    PATIENT'S/FAMILY'S RESPONSE TO PLAN OF CARE: Patient was groggy from surgery but has agreed to short term SNF. Son and husban support this plan and are appreciative of CSW's information and asisstance.

## 2012-04-17 NOTE — Progress Notes (Signed)
Clinical social worker assisted with patient discharge to skilled nursing facility, [facility].  CSW addressed all family questions and concerns. CSW copied chart and added all important documents. CSW also set up patient transportation with [employer]. Clinical Social Worker will sign off for now as social work intervention is no longer needed.  Sabino Niemann, MSW, [employer] [phone]

## 2012-04-17 NOTE — Progress Notes (Signed)
[facility]                                                                                Patient Demographics  Chloe Owens, is a 56 y.o. female  ONG:[id number]  GMW:[id number]  DOB - 20-Nov-1955  Admit date - 04/14/2012  Admitting Physician Senaida Lange, MD  Outpatient Primary MD for the patient is No primary provider on file.  LOS - 3   Chief Complaint  Patient presents with  . Ankle Pain        Assessment & Plan   Medicine will sign off please call with questions   1. Left Ankle Fx: Secondary to trip and fall, Ortho: Primary physician orthopedic surgeon Dr. Kirtland Bouchard.Supple, post surgery on 04/16/2011 , discuss with patient's husband, commence PT OT, DVT prophylaxis per primary team.    2. Hypoglycemia - DM-2 -  now taking oral diet, adjusted Lantus further, continue with sliding scale insulin  Lab Results  Component Value Date   HGBA1C 8.3* 04/14/2012   CBG (last 3)   Basename 04/17/12 0629 04/16/12 2137 04/16/12 1623  GLUCAP 167* 187* 192*      3. Hypotension- resolved, monitor blood pressure have adjusted her medications and is resume her home dose diuretic along with lisinopril.     4. Hyperlipidemia- continue Simvastatin      5. CVA hx -stable , has was baseline memory issues and confusion on and off per her husband, resumed aspirin along with statin no acute issues.     Time Spent in minutes   35   Antibiotics    Anti-infectives    None      Scheduled Meds:    . ALPRAZolam  0.5 mg Oral BID  . aspirin EC  81 mg Oral Daily  . divalproex  500 mg Oral BID  . docusate sodium  100 mg Oral BID  . enoxaparin (LOVENOX) injection  40 mg Subcutaneous Q24H  . escitalopram  20 mg Oral BID  . folic acid  1 mg Oral Daily  . lisinopril  20 mg Oral Daily   And  . hydrochlorothiazide  12.5 mg Oral Daily  . insulin aspart  0-15 Units Subcutaneous TID WC  . insulin glargine  40 Units Subcutaneous Daily  . insulin glargine  5  Units Subcutaneous Once  . pantoprazole  40 mg Oral QHS  . simvastatin  40 mg Oral QPM  . vitamin B-12  1,000 mcg Oral Daily  . [DISCONTINUED] insulin glargine  10 Units Subcutaneous Once  . [DISCONTINUED] insulin glargine  35 Units Subcutaneous Daily  . [DISCONTINUED] insulin glargine  45 Units Subcutaneous Daily  . [DISCONTINUED] lisinopril-hydrochlorothiazide  1 tablet Oral Daily   Continuous Infusions:   PRN Meds:.bisacodyl, diphenhydrAMINE, HYDROcodone-acetaminophen, HYDROmorphone (DILAUDID) injection, metoCLOPramide (REGLAN) injection, metoCLOPramide, metoprolol, morphine injection, ondansetron (ZOFRAN) IV, ondansetron, polyethylene glycol   DVT Prophylaxis  Per ortho -primary team - Lovenox  Lab Results  Component Value Date   PLT 144* 04/16/2012      Susa Raring K M.D on 04/17/2012 at 10:10 AM  Between 7am to 7pm - Pager - [phone]  After 7pm go to [email or web address] - password [email or web address]  And look for  the night coverage person covering for me after hours  [facility] Office  [phone]    Subjective:   Zyann Mabry today has, No headache, No chest pain, No abdominal pain - No Nausea, No new weakness tingling or numbness, No Cough - SOB.    Objective:   Filed Vitals:   04/16/12 1405 04/16/12 1958 04/17/12 0625 04/17/12 0740  BP: 128/66 136/67 142/70   Pulse: 80 94 90 88  Temp: 98.6 F (37 C) 98.9 F (37.2 C) 98.7 F (37.1 C) 98.5 F (36.9 C)  TempSrc: Oral Oral Oral Oral  Resp: 16 16 16    Height:      Weight:      SpO2: 93% 94% 98%     Wt Readings from Last 3 Encounters:  04/15/12 87.68 kg (193 lb 4.8 oz)  04/15/12 87.68 kg (193 lb 4.8 oz)     Intake/Output Summary (Last 24 hours) at 04/17/12 1010 Last data filed at 04/17/12 0625  Gross per 24 hour  Intake    120 ml  Output      7 ml  Net    113 ml    Exam Awake Alert, Oriented X 1, No new F.N deficits, Normal affect Economy.AT,PERRAL Supple Neck,No JVD, No cervical lymphadenopathy  appriciated.  Symmetrical Chest wall movement, Good air movement bilaterally, CTAB RRR,No Gallops,Rubs or new Murmurs, No Parasternal Heave +ve B.Sounds, Abd Soft, Non tender, No organomegaly appriciated, No rebound - guarding or rigidity. No Cyanosis, Clubbing or edema, No new Rash or bruise , L leg in splint   Data Review   Micro Results Recent Results (from the past 240 hour(s))  CULTURE, BLOOD (ROUTINE X 2)     Status: Normal (Preliminary result)   Collection Time   04/14/12  8:25 PM      Component Value Range Status Comment   Specimen Description BLOOD LEFT HAND   Final    Special Requests BOTTLES DRAWN AEROBIC ONLY 5CC   Final    Culture  Setup Time 04/15/2012 02:22   Final    Culture     Final    Value:        BLOOD CULTURE RECEIVED NO GROWTH TO DATE CULTURE WILL BE HELD FOR 5 DAYS BEFORE ISSUING A FINAL NEGATIVE REPORT   Report Status PENDING   Incomplete   CULTURE, BLOOD (ROUTINE X 2)     Status: Normal (Preliminary result)   Collection Time   04/14/12  8:35 PM      Component Value Range Status Comment   Specimen Description BLOOD RIGHT HAND   Final    Special Requests BOTTLES DRAWN AEROBIC ONLY 3CC   Final    Culture  Setup Time 04/15/2012 02:22   Final    Culture     Final    Value:        BLOOD CULTURE RECEIVED NO GROWTH TO DATE CULTURE WILL BE HELD FOR 5 DAYS BEFORE ISSUING A FINAL NEGATIVE REPORT   Report Status PENDING   Incomplete   SURGICAL PCR SCREEN     Status: Normal   Collection Time   04/15/12  8:11 AM      Component Value Range Status Comment   MRSA, PCR NEGATIVE  NEGATIVE Final    Staphylococcus aureus NEGATIVE  NEGATIVE Final     Radiology Reports Dg Ankle Complete Left  04/15/2012  *RADIOLOGY REPORT*  Clinical Data: Ankle fracture with reduction and casting  LEFT ANKLE COMPLETE - 3+ VIEW  Comparison: 04/14/2012  Findings: Casting material is again identified.  There are again seen distal tibial and fibular fractures with some displacement which is  stable in appearance.  There is some lateral displacement of the talus with respect to the distal tibia.  IMPRESSION: Persistent distal tibial and fibular fractures with casting material.  The overall appearance is stable from prior exam.   Original Report Authenticated By: Alcide Clever, M.D.    Dg Ankle Complete Left  04/14/2012  *RADIOLOGY REPORT*  Clinical Data: Postreduction  LEFT ANKLE COMPLETE - 3+ VIEW  Comparison:  Prior films same day  Findings: Study is limited by casting material artifact.  Again noted displaced fracture of distal tibia and fibula with disruption of the ankle mortise.  No significant change in alignment.  IMPRESSION: Again noted displaced fracture of distal tibia and fibula with disruption of the ankle mortise.  No significant change in alignment.   Original Report Authenticated By: Natasha Mead, M.D.    Dg Ankle Complete Left  04/14/2012  *RADIOLOGY REPORT*  Clinical Data: Ankle pain  LEFT ANKLE COMPLETE - 3+ VIEW  Comparison: None.  Findings: Three views of the left ankle submitted.  There is displaced comminuted fracture of the distal tibia medial malleolus. Oblique displaced fracture of the distal left fibula.  Significant disruption of the ankle mortise with medial displacement of the distal tibia.  IMPRESSION: There is displaced comminuted fracture of the distal tibia medial malleolus.  Oblique displaced fracture of the distal left fibula. Significant disruption of the ankle mortise with medial displacement of the distal tibia.   Original Report Authenticated By: Natasha Mead, M.D.    Dg Chest Portable 1 View  04/14/2012  *RADIOLOGY REPORT*  Clinical Data: Ankle pain, preop  PORTABLE CHEST - 1 VIEW  Comparison: None.  Findings: Cardiomediastinal silhouette is unremarkable.  Streaky right basilar atelectasis or infiltrate.  No pulmonary edema.  IMPRESSION: No pulmonary edema.  Streaky right basilar atelectasis or infiltrate.   Original Report Authenticated By: Natasha Mead, M.D.       CBC  Lab 04/16/12 0515 04/14/12 1623  WBC 8.3 12.6*  HGB 10.0* 12.2  HCT 29.2* 35.3*  PLT 144* 189  MCV 89.3 88.7  MCH 30.6 30.7  MCHC 34.2 34.6  RDW 12.9 13.2  LYMPHSABS -- 5.0*  MONOABS -- 0.6  EOSABS -- 0.3  BASOSABS -- 0.0  BANDABS -- --    Chemistries   Lab 04/16/12 0515 04/14/12 1623  NA 136 140  K 3.7 3.9  CL 99 102  CO2 30 32  GLUCOSE 249* 65*  BUN 12 24*  CREATININE 0.78 0.97  CALCIUM 8.4 8.6  MG -- --  AST -- 13  ALT -- 6  ALKPHOS -- 53  BILITOT -- 0.2*   ------------------------------------------------------------------------------------------------------------------ estimated creatinine clearance is 88.8 ml/min (by C-G formula based on Cr of 0.78). ------------------------------------------------------------------------------------------------------------------  [facility] 04/14/12 2033  HGBA1C 8.3*   ------------------------------------------------------------------------------------------------------------------ No results found for this basename: CHOL:2,HDL:2,LDLCALC:2,TRIG:2,CHOLHDL:2,LDLDIRECT:2 in the last 72 hours ------------------------------------------------------------------------------------------------------------------ No results found for this basename: TSH,T4TOTAL,FREET3,T3FREE,THYROIDAB in the last 72 hours ------------------------------------------------------------------------------------------------------------------ No results found for this basename: VITAMINB12:2,FOLATE:2,FERRITIN:2,TIBC:2,IRON:2,RETICCTPCT:2 in the last 72 hours  Coagulation profile No results found for this basename: INR:5,PROTIME:5 in the last 168 hours  No results found for this basename: DDIMER:2 in the last 72 hours  Cardiac Enzymes No results found for this basename: CK:3,CKMB:3,TROPONINI:3,MYOGLOBIN:3 in the last 168 hours ------------------------------------------------------------------------------------------------------------------ No  components found with this basename: POCBNP:3

## 2012-04-18 DIAGNOSIS — M25539 Pain in unspecified wrist: Secondary | ICD-10-CM | POA: Diagnosis not present

## 2012-04-18 DIAGNOSIS — M25549 Pain in joints of unspecified hand: Secondary | ICD-10-CM | POA: Diagnosis not present

## 2012-04-18 NOTE — H&P (Signed)
Please see consult note from 04/14/12. This was labelled as consult but intended as H and P

## 2012-04-21 LAB — CULTURE, BLOOD (ROUTINE X 2)
Culture: NO GROWTH
Culture: NO GROWTH

## 2012-04-22 DIAGNOSIS — S52599A Other fractures of lower end of unspecified radius, initial encounter for closed fracture: Secondary | ICD-10-CM | POA: Diagnosis not present

## 2012-04-22 DIAGNOSIS — S8290XD Unspecified fracture of unspecified lower leg, subsequent encounter for closed fracture with routine healing: Secondary | ICD-10-CM | POA: Diagnosis not present

## 2012-04-30 ENCOUNTER — Encounter (HOSPITAL_COMMUNITY): Payer: Self-pay | Admitting: Pharmacy Technician

## 2012-05-01 DIAGNOSIS — S8290XD Unspecified fracture of unspecified lower leg, subsequent encounter for closed fracture with routine healing: Secondary | ICD-10-CM | POA: Diagnosis not present

## 2012-05-05 DIAGNOSIS — E119 Type 2 diabetes mellitus without complications: Secondary | ICD-10-CM | POA: Diagnosis not present

## 2012-05-05 DIAGNOSIS — M6281 Muscle weakness (generalized): Secondary | ICD-10-CM | POA: Diagnosis not present

## 2012-05-05 DIAGNOSIS — F411 Generalized anxiety disorder: Secondary | ICD-10-CM | POA: Diagnosis not present

## 2012-05-05 DIAGNOSIS — Z4789 Encounter for other orthopedic aftercare: Secondary | ICD-10-CM | POA: Diagnosis not present

## 2012-05-05 DIAGNOSIS — S82843A Displaced bimalleolar fracture of unspecified lower leg, initial encounter for closed fracture: Secondary | ICD-10-CM | POA: Diagnosis not present

## 2012-05-05 DIAGNOSIS — Z79899 Other long term (current) drug therapy: Secondary | ICD-10-CM | POA: Diagnosis not present

## 2012-05-05 DIAGNOSIS — Z8673 Personal history of transient ischemic attack (TIA), and cerebral infarction without residual deficits: Secondary | ICD-10-CM | POA: Diagnosis not present

## 2012-05-05 DIAGNOSIS — R262 Difficulty in walking, not elsewhere classified: Secondary | ICD-10-CM | POA: Diagnosis not present

## 2012-05-05 DIAGNOSIS — F3189 Other bipolar disorder: Secondary | ICD-10-CM | POA: Diagnosis not present

## 2012-05-05 DIAGNOSIS — Z5189 Encounter for other specified aftercare: Secondary | ICD-10-CM | POA: Diagnosis not present

## 2012-05-05 DIAGNOSIS — E785 Hyperlipidemia, unspecified: Secondary | ICD-10-CM | POA: Diagnosis not present

## 2012-05-05 DIAGNOSIS — F17200 Nicotine dependence, unspecified, uncomplicated: Secondary | ICD-10-CM | POA: Diagnosis present

## 2012-05-05 DIAGNOSIS — S52599A Other fractures of lower end of unspecified radius, initial encounter for closed fracture: Secondary | ICD-10-CM | POA: Diagnosis not present

## 2012-05-05 DIAGNOSIS — S8290XD Unspecified fracture of unspecified lower leg, subsequent encounter for closed fracture with routine healing: Secondary | ICD-10-CM | POA: Diagnosis not present

## 2012-05-05 DIAGNOSIS — I10 Essential (primary) hypertension: Secondary | ICD-10-CM | POA: Diagnosis not present

## 2012-05-05 DIAGNOSIS — Z9181 History of falling: Secondary | ICD-10-CM | POA: Diagnosis not present

## 2012-05-05 DIAGNOSIS — Z794 Long term (current) use of insulin: Secondary | ICD-10-CM | POA: Diagnosis not present

## 2012-05-05 DIAGNOSIS — G709 Myoneural disorder, unspecified: Secondary | ICD-10-CM | POA: Diagnosis present

## 2012-05-05 DIAGNOSIS — Z7982 Long term (current) use of aspirin: Secondary | ICD-10-CM | POA: Diagnosis not present

## 2012-05-05 DIAGNOSIS — K219 Gastro-esophageal reflux disease without esophagitis: Secondary | ICD-10-CM | POA: Diagnosis not present

## 2012-05-08 ENCOUNTER — Encounter (HOSPITAL_COMMUNITY): Payer: Self-pay

## 2012-05-08 NOTE — Progress Notes (Addendum)
Contacted Dr. Michelle Piper regarding patient diabetic status, blood sugars run in the 30's and 40's in the am per Wille Celeste, nurse with [facility] nursing facility. Patients surgery scheduled for 16:30pm on 05/09/12.  Dr. Michelle Piper request Dr. Victorino Dike be notified of patients diabetic status. Orders received from Dr. Michelle Piper - patient be given light breakfast before 8:00am, given 1/2 dose of her am insulin and  Check Blood sugar upon arrival to short stay unit. Contacted Dr. Laverta [place] office, spoke with Dr. Charlann Boxer (on call), notified of Dr. Deirdre Priest order. Per Dr. Michelle Piper patient surgery time can not be moved up due to having light breakfast prior to 8am.  Notified Wille Celeste, with [facility] nursing facility of order.

## 2012-05-09 ENCOUNTER — Encounter (HOSPITAL_COMMUNITY): Payer: Self-pay | Admitting: Anesthesiology

## 2012-05-09 ENCOUNTER — Ambulatory Visit (HOSPITAL_COMMUNITY): Payer: BC Managed Care – PPO

## 2012-05-09 ENCOUNTER — Encounter (HOSPITAL_COMMUNITY)
Admission: RE | Disposition: A | Discharge status: Skilled Nursing Facility | Payer: Self-pay | Source: Ambulatory Visit | Attending: Orthopedic Surgery

## 2012-05-09 ENCOUNTER — Ambulatory Visit (HOSPITAL_COMMUNITY): Payer: BC Managed Care – PPO | Admitting: Anesthesiology

## 2012-05-09 ENCOUNTER — Observation Stay (HOSPITAL_COMMUNITY)
Admission: RE | Admit: 2012-05-09 | Discharge: 2012-05-10 | DRG: 219 | Disposition: A | Discharge status: Skilled Nursing Facility | Payer: BC Managed Care – PPO | Source: Ambulatory Visit | Attending: Orthopedic Surgery | Admitting: Orthopedic Surgery

## 2012-05-09 DIAGNOSIS — F17200 Nicotine dependence, unspecified, uncomplicated: Secondary | ICD-10-CM | POA: Diagnosis present

## 2012-05-09 DIAGNOSIS — Z8673 Personal history of transient ischemic attack (TIA), and cerebral infarction without residual deficits: Secondary | ICD-10-CM | POA: Diagnosis not present

## 2012-05-09 DIAGNOSIS — W19XXXA Unspecified fall, initial encounter: Secondary | ICD-10-CM | POA: Insufficient documentation

## 2012-05-09 DIAGNOSIS — K219 Gastro-esophageal reflux disease without esophagitis: Secondary | ICD-10-CM | POA: Diagnosis not present

## 2012-05-09 DIAGNOSIS — S82892A Other fracture of left lower leg, initial encounter for closed fracture: Secondary | ICD-10-CM

## 2012-05-09 DIAGNOSIS — S82843A Displaced bimalleolar fracture of unspecified lower leg, initial encounter for closed fracture: Secondary | ICD-10-CM | POA: Diagnosis not present

## 2012-05-09 DIAGNOSIS — E785 Hyperlipidemia, unspecified: Secondary | ICD-10-CM | POA: Insufficient documentation

## 2012-05-09 DIAGNOSIS — Z4789 Encounter for other orthopedic aftercare: Secondary | ICD-10-CM | POA: Diagnosis not present

## 2012-05-09 DIAGNOSIS — Z7982 Long term (current) use of aspirin: Secondary | ICD-10-CM | POA: Diagnosis not present

## 2012-05-09 DIAGNOSIS — F3189 Other bipolar disorder: Secondary | ICD-10-CM | POA: Diagnosis not present

## 2012-05-09 DIAGNOSIS — Z794 Long term (current) use of insulin: Secondary | ICD-10-CM | POA: Diagnosis not present

## 2012-05-09 DIAGNOSIS — G709 Myoneural disorder, unspecified: Secondary | ICD-10-CM | POA: Diagnosis present

## 2012-05-09 DIAGNOSIS — Z9181 History of falling: Secondary | ICD-10-CM | POA: Diagnosis not present

## 2012-05-09 DIAGNOSIS — S8290XD Unspecified fracture of unspecified lower leg, subsequent encounter for closed fracture with routine healing: Secondary | ICD-10-CM | POA: Diagnosis not present

## 2012-05-09 DIAGNOSIS — E119 Type 2 diabetes mellitus without complications: Secondary | ICD-10-CM | POA: Diagnosis not present

## 2012-05-09 DIAGNOSIS — I10 Essential (primary) hypertension: Secondary | ICD-10-CM | POA: Diagnosis not present

## 2012-05-09 DIAGNOSIS — Z79899 Other long term (current) drug therapy: Secondary | ICD-10-CM | POA: Diagnosis not present

## 2012-05-09 DIAGNOSIS — M6281 Muscle weakness (generalized): Secondary | ICD-10-CM | POA: Diagnosis not present

## 2012-05-09 DIAGNOSIS — R262 Difficulty in walking, not elsewhere classified: Secondary | ICD-10-CM | POA: Diagnosis not present

## 2012-05-09 DIAGNOSIS — S52599A Other fractures of lower end of unspecified radius, initial encounter for closed fracture: Secondary | ICD-10-CM | POA: Diagnosis not present

## 2012-05-09 DIAGNOSIS — F411 Generalized anxiety disorder: Secondary | ICD-10-CM | POA: Diagnosis not present

## 2012-05-09 DIAGNOSIS — Y92009 Unspecified place in unspecified non-institutional (private) residence as the place of occurrence of the external cause: Secondary | ICD-10-CM | POA: Insufficient documentation

## 2012-05-09 DIAGNOSIS — Z5189 Encounter for other specified aftercare: Secondary | ICD-10-CM | POA: Diagnosis not present

## 2012-05-09 HISTORY — DX: Gastro-esophageal reflux disease without esophagitis: K21.9

## 2012-05-09 HISTORY — DX: Cystitis, unspecified without hematuria: N30.90

## 2012-05-09 HISTORY — DX: Myoneural disorder, unspecified: G70.9

## 2012-05-09 HISTORY — PX: ORIF ANKLE FRACTURE: SHX5408

## 2012-05-09 LAB — CBC
HCT: 34.7 % — ABNORMAL LOW (ref 36.0–46.0)
Hemoglobin: 11.8 g/dL — ABNORMAL LOW (ref 12.0–15.0)
MCH: 29.9 pg (ref 26.0–34.0)
MCHC: 34 g/dL (ref 30.0–36.0)
MCV: 87.8 fL (ref 78.0–100.0)
Platelets: 238 10*3/uL (ref 150–400)
RBC: 3.95 MIL/uL (ref 3.87–5.11)
RDW: 13.3 % (ref 11.5–15.5)
WBC: 9.1 10*3/uL (ref 4.0–10.5)

## 2012-05-09 LAB — BASIC METABOLIC PANEL
BUN: 27 mg/dL — ABNORMAL HIGH (ref 6–23)
CO2: 31 mEq/L (ref 19–32)
Calcium: 9.5 mg/dL (ref 8.4–10.5)
Chloride: 100 mEq/L (ref 96–112)
Creatinine, Ser: 0.88 mg/dL (ref 0.50–1.10)
GFR calc Af Amer: 82 mL/min — ABNORMAL LOW (ref >90)
GFR calc non Af Amer: 71 mL/min — ABNORMAL LOW (ref >90)
Glucose, Bld: 102 mg/dL — ABNORMAL HIGH (ref 70–99)
Potassium: 3.2 mEq/L — ABNORMAL LOW (ref 3.5–5.1)
Sodium: 140 mEq/L (ref 135–145)

## 2012-05-09 LAB — GLUCOSE, CAPILLARY
Glucose-Capillary: 103 mg/dL — ABNORMAL HIGH (ref 70–99)
Glucose-Capillary: 87 mg/dL (ref 70–99)
Glucose-Capillary: 176 mg/dL — ABNORMAL HIGH (ref 70–99)

## 2012-05-09 SURGERY — OPEN REDUCTION INTERNAL FIXATION (ORIF) ANKLE FRACTURE
Anesthesia: General | Site: Ankle | Laterality: Left | Wound class: Clean

## 2012-05-09 MED ORDER — METHOCARBAMOL 100 MG/ML IJ SOLN: 500.0000 mg | Freq: Four times a day (QID) | INTRAVENOUS | Status: DC | PRN

## 2012-05-09 MED ORDER — HYDROMORPHONE HCL PF 1 MG/ML IJ SOLN
0.5000 mg | INTRAMUSCULAR | Status: DC | PRN
  Administered 2012-05-09 – 2012-05-10 (×4): 1 mg via INTRAVENOUS
  Filled 2012-05-09 (×4): qty 1

## 2012-05-09 MED ORDER — FENTANYL CITRATE 0.05 MG/ML IJ SOLN
INTRAMUSCULAR | Status: AC
  Filled 2012-05-09: qty 2

## 2012-05-09 MED ORDER — ZINC GLUCONATE 50 MG PO TABS: 50.0000 mg | ORAL_TABLET | Freq: Every day | ORAL | Status: DC

## 2012-05-09 MED ORDER — VANCOMYCIN HCL IN DEXTROSE 1-5 GM/200ML-% IV SOLN
INTRAVENOUS | Status: AC
  Filled 2012-05-09: qty 200

## 2012-05-09 MED ORDER — BUPIVACAINE HCL (PF) 0.25 % IJ SOLN
INTRAMUSCULAR | Status: AC
  Filled 2012-05-09: qty 30

## 2012-05-09 MED ORDER — LACTATED RINGERS IV SOLN
INTRAVENOUS | Status: DC
  Administered 2012-05-09: 16:00:00 via INTRAVENOUS

## 2012-05-09 MED ORDER — ESCITALOPRAM OXALATE 20 MG PO TABS
20.0000 mg | ORAL_TABLET | Freq: Two times a day (BID) | ORAL | Status: DC
  Administered 2012-05-10: 20 mg via ORAL
  Filled 2012-05-09 (×3): qty 1

## 2012-05-09 MED ORDER — LACTATED RINGERS IV SOLN
INTRAVENOUS | Status: DC | PRN
  Administered 2012-05-09 (×2): via INTRAVENOUS

## 2012-05-09 MED ORDER — HYDROMORPHONE HCL PF 1 MG/ML IJ SOLN
0.2500 mg | INTRAMUSCULAR | Status: DC | PRN
  Administered 2012-05-09 (×6): 0.5 mg via INTRAVENOUS

## 2012-05-09 MED ORDER — INSULIN GLARGINE 100 UNIT/ML ~~LOC~~ SOLN
35.0000 [IU] | Freq: Every day | SUBCUTANEOUS | Status: DC
  Administered 2012-05-10: 35 [IU] via SUBCUTANEOUS

## 2012-05-09 MED ORDER — FENTANYL CITRATE 0.05 MG/ML IJ SOLN
INTRAMUSCULAR | Status: DC | PRN
  Administered 2012-05-09: 50 ug via INTRAVENOUS
  Administered 2012-05-09: 150 ug via INTRAVENOUS
  Administered 2012-05-09: 50 ug via INTRAVENOUS

## 2012-05-09 MED ORDER — 0.9 % SODIUM CHLORIDE (POUR BTL) OPTIME
TOPICAL | Status: DC | PRN
  Administered 2012-05-09: 1000 mL

## 2012-05-09 MED ORDER — SODIUM CHLORIDE 0.9 % IV SOLN: INTRAVENOUS | Status: DC

## 2012-05-09 MED ORDER — METOCLOPRAMIDE HCL 10 MG PO TABS: 5.0000 mg | ORAL_TABLET | Freq: Three times a day (TID) | ORAL | Status: DC | PRN

## 2012-05-09 MED ORDER — BACITRACIN ZINC 500 UNIT/GM EX OINT
TOPICAL_OINTMENT | CUTANEOUS | Status: DC | PRN
  Administered 2012-05-09: 1 via TOPICAL

## 2012-05-09 MED ORDER — FENTANYL CITRATE 0.05 MG/ML IJ SOLN: 50.0000 ug | INTRAMUSCULAR | Status: DC | PRN

## 2012-05-09 MED ORDER — MIDAZOLAM HCL 2 MG/2ML IJ SOLN
INTRAMUSCULAR | Status: AC
  Filled 2012-05-09: qty 2

## 2012-05-09 MED ORDER — LISINOPRIL-HYDROCHLOROTHIAZIDE 20-12.5 MG PO TABS: 1.0000 | ORAL_TABLET | Freq: Every day | ORAL | Status: DC

## 2012-05-09 MED ORDER — ONDANSETRON HCL 4 MG PO TABS: 4.0000 mg | ORAL_TABLET | Freq: Four times a day (QID) | ORAL | Status: DC | PRN

## 2012-05-09 MED ORDER — POLYETHYLENE GLYCOL 3350 17 G PO PACK
17.0000 g | PACK | Freq: Every day | ORAL | Status: DC
  Administered 2012-05-10: 17 g via ORAL
  Filled 2012-05-09: qty 1

## 2012-05-09 MED ORDER — CALCIUM CARBONATE-VITAMIN D 500-200 MG-UNIT PO TABS
1.0000 | ORAL_TABLET | Freq: Two times a day (BID) | ORAL | Status: DC
  Administered 2012-05-10: 1 via ORAL
  Filled 2012-05-09 (×3): qty 1

## 2012-05-09 MED ORDER — PROMETHAZINE HCL 25 MG/ML IJ SOLN: 6.2500 mg | INTRAMUSCULAR | Status: DC | PRN

## 2012-05-09 MED ORDER — DEXTROSE 50 % IV SOLN
INTRAVENOUS | Status: AC
  Administered 2012-05-09: 25 mL
  Filled 2012-05-09: qty 50

## 2012-05-09 MED ORDER — DIVALPROEX SODIUM ER 500 MG PO TB24
500.0000 mg | ORAL_TABLET | Freq: Two times a day (BID) | ORAL | Status: DC
  Administered 2012-05-10: 500 mg via ORAL
  Filled 2012-05-09 (×3): qty 1

## 2012-05-09 MED ORDER — SIMVASTATIN 40 MG PO TABS
40.0000 mg | ORAL_TABLET | Freq: Every evening | ORAL | Status: DC
  Filled 2012-05-09 (×2): qty 1

## 2012-05-09 MED ORDER — VITAMIN B-12 1000 MCG PO TABS
1000.0000 ug | ORAL_TABLET | Freq: Every day | ORAL | Status: DC
  Administered 2012-05-10: 1000 ug via ORAL
  Filled 2012-05-09: qty 1

## 2012-05-09 MED ORDER — LIDOCAINE HCL (CARDIAC) 20 MG/ML IV SOLN
INTRAVENOUS | Status: DC | PRN
  Administered 2012-05-09: 50 mg via INTRAVENOUS

## 2012-05-09 MED ORDER — METHOCARBAMOL 500 MG PO TABS
ORAL_TABLET | ORAL | Status: AC
  Filled 2012-05-09: qty 1

## 2012-05-09 MED ORDER — VITAMIN C 500 MG PO TABS
500.0000 mg | ORAL_TABLET | Freq: Every day | ORAL | Status: DC
  Administered 2012-05-10: 500 mg via ORAL
  Filled 2012-05-09: qty 1

## 2012-05-09 MED ORDER — ALPRAZOLAM 0.25 MG PO TABS
0.2500 mg | ORAL_TABLET | Freq: Two times a day (BID) | ORAL | Status: DC
  Administered 2012-05-09 – 2012-05-10 (×2): 0.25 mg via ORAL
  Filled 2012-05-09 (×2): qty 1

## 2012-05-09 MED ORDER — PROPOFOL 10 MG/ML IV BOLUS
INTRAVENOUS | Status: DC | PRN
  Administered 2012-05-09: 120 mg via INTRAVENOUS

## 2012-05-09 MED ORDER — INSULIN ASPART 100 UNIT/ML ~~LOC~~ SOLN: 2.0000 [IU] | Freq: Three times a day (TID) | SUBCUTANEOUS | Status: DC

## 2012-05-09 MED ORDER — HYDROCHLOROTHIAZIDE 12.5 MG PO CAPS
12.5000 mg | ORAL_CAPSULE | Freq: Every day | ORAL | Status: DC
  Administered 2012-05-10: 12.5 mg via ORAL
  Filled 2012-05-09: qty 1

## 2012-05-09 MED ORDER — ONDANSETRON HCL 4 MG/2ML IJ SOLN
INTRAMUSCULAR | Status: AC
  Filled 2012-05-09: qty 2

## 2012-05-09 MED ORDER — MIDAZOLAM HCL 2 MG/2ML IJ SOLN: 1.0000 mg | INTRAMUSCULAR | Status: DC | PRN

## 2012-05-09 MED ORDER — ONDANSETRON HCL 4 MG/2ML IJ SOLN
4.0000 mg | Freq: Four times a day (QID) | INTRAMUSCULAR | Status: DC | PRN
  Administered 2012-05-10: 4 mg via INTRAVENOUS
  Filled 2012-05-09: qty 2

## 2012-05-09 MED ORDER — PANTOPRAZOLE SODIUM 40 MG PO TBEC
40.0000 mg | DELAYED_RELEASE_TABLET | Freq: Every day | ORAL | Status: DC
  Administered 2012-05-10: 40 mg via ORAL
  Filled 2012-05-09: qty 1

## 2012-05-09 MED ORDER — OXYCODONE HCL 5 MG PO TABS
5.0000 mg | ORAL_TABLET | ORAL | Status: DC | PRN
  Administered 2012-05-09 – 2012-05-10 (×3): 10 mg via ORAL
  Filled 2012-05-09 (×2): qty 2

## 2012-05-09 MED ORDER — HYDROMORPHONE HCL PF 1 MG/ML IJ SOLN
INTRAMUSCULAR | Status: AC
  Filled 2012-05-09: qty 1

## 2012-05-09 MED ORDER — LISINOPRIL 20 MG PO TABS
20.0000 mg | ORAL_TABLET | Freq: Every day | ORAL | Status: DC
  Administered 2012-05-10: 20 mg via ORAL
  Filled 2012-05-09: qty 1

## 2012-05-09 MED ORDER — OXYCODONE HCL 5 MG PO TABS
ORAL_TABLET | ORAL | Status: AC
  Filled 2012-05-09: qty 2

## 2012-05-09 MED ORDER — METHOCARBAMOL 500 MG PO TABS
500.0000 mg | ORAL_TABLET | Freq: Four times a day (QID) | ORAL | Status: DC | PRN
  Administered 2012-05-09 – 2012-05-10 (×2): 500 mg via ORAL
  Filled 2012-05-09: qty 1

## 2012-05-09 MED ORDER — ADULT MULTIVITAMIN W/MINERALS CH
1.0000 | ORAL_TABLET | Freq: Every day | ORAL | Status: DC
Start: 2012-05-10 — End: 2012-05-10
  Administered 2012-05-10: 1 via ORAL
  Filled 2012-05-09: qty 1

## 2012-05-09 MED ORDER — BUPIVACAINE HCL (PF) 0.25 % IJ SOLN
INTRAMUSCULAR | Status: DC | PRN
  Administered 2012-05-09: 27 mL

## 2012-05-09 MED ORDER — DOUBLE ANTIBIOTIC 500-10000 UNIT/GM EX OINT
TOPICAL_OINTMENT | CUTANEOUS | Status: AC
  Filled 2012-05-09: qty 1

## 2012-05-09 MED ORDER — VANCOMYCIN HCL IN DEXTROSE 1-5 GM/200ML-% IV SOLN: 1000.0000 mg | INTRAVENOUS | Status: DC

## 2012-05-09 MED ORDER — DEXTROSE 50 % IV SOLN
25.0000 mL | Freq: Once | INTRAVENOUS | Status: DC
  Filled 2012-05-09: qty 50

## 2012-05-09 MED ORDER — VITAMIN D3 25 MCG (1000 UNIT) PO TABS
1000.0000 [IU] | ORAL_TABLET | Freq: Every day | ORAL | Status: DC
  Administered 2012-05-10: 1000 [IU] via ORAL
  Filled 2012-05-09: qty 1

## 2012-05-09 MED ORDER — FOLIC ACID 1 MG PO TABS
1.0000 mg | ORAL_TABLET | Freq: Every day | ORAL | Status: DC
  Administered 2012-05-10: 1 mg via ORAL
  Filled 2012-05-09: qty 1

## 2012-05-09 MED ORDER — SODIUM CHLORIDE 0.9 % IV SOLN
INTRAVENOUS | Status: DC
  Administered 2012-05-10: 11:00:00 via INTRAVENOUS

## 2012-05-09 MED ORDER — INSULIN ASPART 100 UNIT/ML ~~LOC~~ SOLN
0.0000 [IU] | Freq: Three times a day (TID) | SUBCUTANEOUS | Status: DC
  Administered 2012-05-10: 11 [IU] via SUBCUTANEOUS

## 2012-05-09 MED ORDER — ONDANSETRON HCL 4 MG/2ML IJ SOLN
INTRAMUSCULAR | Status: DC | PRN
  Administered 2012-05-09: 4 mg via INTRAVENOUS

## 2012-05-09 MED ORDER — VANCOMYCIN HCL IN DEXTROSE 1-5 GM/200ML-% IV SOLN
1000.0000 mg | Freq: Two times a day (BID) | INTRAVENOUS | Status: AC
  Administered 2012-05-09: 1000 mg via INTRAVENOUS
  Filled 2012-05-09: qty 200

## 2012-05-09 MED ORDER — CHLORHEXIDINE GLUCONATE 4 % EX LIQD: 60.0000 mL | Freq: Once | CUTANEOUS | Status: DC

## 2012-05-09 MED ORDER — METOCLOPRAMIDE HCL 5 MG/ML IJ SOLN
5.0000 mg | Freq: Three times a day (TID) | INTRAMUSCULAR | Status: DC | PRN
  Filled 2012-05-09: qty 2

## 2012-05-09 MED ORDER — INSULIN ASPART 100 UNIT/ML ~~LOC~~ SOLN: 2.0000 [IU] | Freq: Every day | SUBCUTANEOUS | Status: DC

## 2012-05-09 SURGICAL SUPPLY — 73 items
BANDAGE ELASTIC 4 VELCRO ST LF (GAUZE/BANDAGES/DRESSINGS) ×1 IMPLANT
BANDAGE ELASTIC 6 VELCRO ST LF (GAUZE/BANDAGES/DRESSINGS) ×1 IMPLANT
BANDAGE ESMARK 6X9 LF (GAUZE/BANDAGES/DRESSINGS) ×1 IMPLANT
BIT DRILL 2.5X2.75 QC CALB (BIT) ×1 IMPLANT
BIT DRILL 3.5X5.5 QC CALB (BIT) ×1 IMPLANT
BIT DRILL CALIBRATED 2.7 (BIT) ×1 IMPLANT
BLADE SURG 15 STRL LF DISP TIS (BLADE) ×1 IMPLANT
BLADE SURG 15 STRL SS (BLADE) ×2
BNDG CMPR 9X6 STRL LF SNTH (GAUZE/BANDAGES/DRESSINGS) ×1
BNDG COHESIVE 4X5 TAN STRL (GAUZE/BANDAGES/DRESSINGS) ×1 IMPLANT
BNDG COHESIVE 6X5 TAN STRL LF (GAUZE/BANDAGES/DRESSINGS) ×1 IMPLANT
BNDG ESMARK 6X9 LF (GAUZE/BANDAGES/DRESSINGS) ×2
CHLORAPREP W/TINT 26ML (MISCELLANEOUS) ×2 IMPLANT
CLOTH BEACON ORANGE TIMEOUT ST (SAFETY) ×2 IMPLANT
COVER SURGICAL LIGHT HANDLE (MISCELLANEOUS) ×2 IMPLANT
CUFF TOURNIQUET SINGLE 34IN LL (TOURNIQUET CUFF) ×2 IMPLANT
CUFF TOURNIQUET SINGLE 44IN (TOURNIQUET CUFF) IMPLANT
DRAPE C-ARM 42X72 X-RAY (DRAPES) ×2 IMPLANT
DRAPE C-ARMOR (DRAPES) ×2 IMPLANT
DRAPE U-SHAPE 47X51 STRL (DRAPES) ×2 IMPLANT
DRSG ADAPTIC 3X8 NADH LF (GAUZE/BANDAGES/DRESSINGS) ×1 IMPLANT
DRSG PAD ABDOMINAL 8X10 ST (GAUZE/BANDAGES/DRESSINGS) ×4 IMPLANT
ELECT REM PT RETURN 9FT ADLT (ELECTROSURGICAL) ×2
ELECTRODE REM PT RTRN 9FT ADLT (ELECTROSURGICAL) ×1 IMPLANT
GLOVE BIO SURGEON STRL SZ8 (GLOVE) ×2 IMPLANT
GLOVE BIOGEL PI IND STRL 8 (GLOVE) ×1 IMPLANT
GLOVE BIOGEL PI INDICATOR 8 (GLOVE) ×1
GOWN PREVENTION PLUS XLARGE (GOWN DISPOSABLE) ×2 IMPLANT
GOWN STRL NON-REIN LRG LVL3 (GOWN DISPOSABLE) ×4 IMPLANT
K-WIRE ACE 1.6X6 (WIRE) ×4
KIT BASIN OR (CUSTOM PROCEDURE TRAY) ×2 IMPLANT
KIT ROOM TURNOVER OR (KITS) ×2 IMPLANT
KWIRE ACE 1.6X6 (WIRE) IMPLANT
MANIFOLD NEPTUNE II (INSTRUMENTS) ×2 IMPLANT
NEEDLE 22X1 1/2 (OR ONLY) (NEEDLE) IMPLANT
NS IRRIG 1000ML POUR BTL (IV SOLUTION) ×2 IMPLANT
PACK ORTHO EXTREMITY (CUSTOM PROCEDURE TRAY) ×2 IMPLANT
PAD ARMBOARD 7.5X6 YLW CONV (MISCELLANEOUS) ×4 IMPLANT
PAD CAST 3X4 CTTN HI CHSV (CAST SUPPLIES) IMPLANT
PAD CAST 4YDX4 CTTN HI CHSV (CAST SUPPLIES) ×1 IMPLANT
PADDING CAST COTTON 3X4 STRL (CAST SUPPLIES) ×2
PADDING CAST COTTON 4X4 STRL (CAST SUPPLIES) ×2
PLATE LOCK 8H 103 BILAT FIB (Plate) ×1 IMPLANT
SCREW ACE CAN 4.0 44M (Screw) ×2 IMPLANT
SCREW CORTICAL 3.5MM  12MM (Screw) ×1 IMPLANT
SCREW CORTICAL 3.5MM  28MM (Screw) ×1 IMPLANT
SCREW CORTICAL 3.5MM 12MM (Screw) IMPLANT
SCREW CORTICAL 3.5MM 28MM (Screw) IMPLANT
SCREW CORTICAL LOW PROF 3.5X20 (Screw) ×1 IMPLANT
SCREW LOCK 3.5X10 DIST TIB (Screw) ×1 IMPLANT
SCREW LOCK CORT STAR 3.5X12 (Screw) ×1 IMPLANT
SCREW LOW PROFILE 12MMX3.5MM (Screw) ×1 IMPLANT
SCREW NON LOCKING LP 3.5 14MM (Screw) ×2 IMPLANT
SPLINT PLASTER CAST XFAST 5X30 (CAST SUPPLIES) IMPLANT
SPLINT PLASTER XFAST SET 5X30 (CAST SUPPLIES) ×1
SPONGE GAUZE 4X4 12PLY (GAUZE/BANDAGES/DRESSINGS) ×1 IMPLANT
SPONGE LAP 18X18 X RAY DECT (DISPOSABLE) ×2 IMPLANT
STAPLER VISISTAT 35W (STAPLE) IMPLANT
SUCTION FRAZIER TIP 10 FR DISP (SUCTIONS) ×2 IMPLANT
SUT ETHILON 3 0 PS 1 (SUTURE) ×1 IMPLANT
SUT MNCRL AB 3-0 PS2 18 (SUTURE) ×1 IMPLANT
SUT PROLENE 3 0 PS 2 (SUTURE) ×2 IMPLANT
SUT VIC AB 0 CT1 27 (SUTURE) ×2
SUT VIC AB 0 CT1 27XBRD ANBCTR (SUTURE) IMPLANT
SUT VIC AB 2-0 CT1 27 (SUTURE) ×4
SUT VIC AB 2-0 CT1 TAPERPNT 27 (SUTURE) ×2 IMPLANT
SUT VIC AB 3-0 PS2 18 (SUTURE) ×2
SUT VIC AB 3-0 PS2 18XBRD (SUTURE) ×1 IMPLANT
SYR CONTROL 10ML LL (SYRINGE) IMPLANT
TOWEL OR 17X24 6PK STRL BLUE (TOWEL DISPOSABLE) ×2 IMPLANT
TOWEL OR 17X26 10 PK STRL BLUE (TOWEL DISPOSABLE) ×2 IMPLANT
TUBE CONNECTING 12X1/4 (SUCTIONS) ×2 IMPLANT
WATER STERILE IRR 1000ML POUR (IV SOLUTION) ×2 IMPLANT

## 2012-05-09 NOTE — Progress Notes (Signed)
Orthopedic Tech Progress Note Patient Details:  Chloe Owens [facility] [birth year] [id number]  Casting Type of Cast: Short leg cast Cast Location: (L) LE Cast Material: Fiberglass Cast Intervention: Removal     Jennye Moccasin 05/09/2012, 4:33 PM

## 2012-05-09 NOTE — Anesthesia Procedure Notes (Signed)
Procedure Name: LMA Insertion Date/Time: 05/09/2012 4:46 PM Performed by: Gwenyth Allegra Pre-anesthesia Checklist: Patient identified, Timeout performed, Emergency Drugs available, Suction available and Patient being monitored Patient Re-evaluated:Patient Re-evaluated prior to inductionOxygen Delivery Method: Circle system utilized Preoxygenation: Pre-oxygenation with 100% oxygen Intubation Type: IV induction LMA: LMA inserted LMA Size: 4.0 Number of attempts: 1 Placement Confirmation: breath sounds checked- equal and bilateral and positive ETCO2 Tube secured with: Tape Dental Injury: Teeth and Oropharynx as per pre-operative assessment

## 2012-05-09 NOTE — Progress Notes (Signed)
PHARMACIST - PHYSICIAN ORDER COMMUNICATION  CONCERNING: P&T Medication Policy on Herbal Medications  DESCRIPTION:  This patient's order for:  Zinc Gluconate (7mg  Zinc)  has been noted.  This product(s) is classified as an "herbal" or natural product. Due to a lack of definitive safety studies or FDA approval, nonstandard manufacturing practices, plus the potential risk of unknown drug-drug interactions while on inpatient medications, the Pharmacy and Therapeutics Committee does not permit the use of "herbal" or natural products of this type within [facility].   ACTION TAKEN:  Please reevaluate patient's clinical condition at discharge and address if the herbal or natural product(s) should be resumed at that time.   Marisue Humble, PharmD Clinical Pharmacist [facility]

## 2012-05-09 NOTE — Brief Op Note (Signed)
05/09/2012  9:56 PM  PATIENT:  Chloe Owens  56 y.o. female  PRE-OPERATIVE DIAGNOSIS:  LEFT ANKLE BIMALLEOLAR FRACTURE  POST-OPERATIVE DIAGNOSIS:  LEFT ANKLE BIMALLEOLAR FRACTURE  Procedure(s): 1.  ORIF left ankle bimalleolar fracture 2.  Fluoro  SURGEON:  Toni Arthurs, MD  ASSISTANT: n/a  ANESTHESIA:   General  EBL:  minimal   TOURNIQUET:   Total Tourniquet Time Documented: Thigh (Left) - 60 minutes  COMPLICATIONS:  None apparent  DISPOSITION:  Extubated, awake and stable to recovery.  DICTATION ID:

## 2012-05-09 NOTE — Progress Notes (Signed)
Spoke with Tonya at the nursing home re: insulin dose this am. Pt did not receive her insulin this am. CBG is 120. Nurse advised to hold insulin since it is late. CBG will be checked on arrival to Short stay and insulin administered if needed.

## 2012-05-09 NOTE — H&P (Signed)
Chloe Owens is an 56 y.o. female.   Chief Complaint: left ankle fracture HPI: 56 y/o female with PMH of diabetes and stroke fell at home 2 weeks ago injuring her left ankle.  She udnerwent closed reduction by Dr. Rennis Chris.  She had severe fracture blisters which have now adequately resolved.  She presents now for ORIF of her ankle fracture.  Past Medical History  Diagnosis Date  . Diabetes mellitus without complication   . Hypertension   . Stroke     05/2008  . Hyperlipidemia   . Bladder infection     hx of  . GERD (gastroesophageal reflux disease)   . Neuromuscular disorder     Past Surgical History  Procedure Date  . Ankle closed reduction 04/15/2012    Procedure: CLOSED REDUCTION ANKLE;  Surgeon: Senaida Lange, MD;  Location: [facility] OR;  Service: Orthopedics;  Laterality: Left;  . Carpal tunnel release     bilaterally    Family History  Problem Relation Age of Onset  . CAD Father   . CAD Mother   . Cancer - Other Mother     Breast  . Cancer - Other Brother     Unknown type   Social History:  reports that she has been smoking.  She does not have any smokeless tobacco history on file. She reports that she does not drink alcohol or use illicit drugs.  Allergies:  Allergies  Allergen Reactions  . Codeine Nausea And Vomiting and Rash  . Penicillins Rash    Medications Prior to Admission  Medication Sig Dispense Refill  . ALPRAZolam (XANAX) 0.25 MG tablet Take 0.25 mg by mouth 2 (two) times daily.      Marland Kitchen aspirin EC 81 MG tablet Take 81 mg by mouth daily.      . calcium-vitamin D (OSCAL WITH D) 500-200 MG-UNIT per tablet Take 1 tablet by mouth 2 (two) times daily.      . cholecalciferol (VITAMIN D) 1000 UNITS tablet Take 1,000 Units by mouth daily.      . divalproex (DEPAKOTE ER) 500 MG 24 hr tablet Take 500 mg by mouth 2 (two) times daily.      Marland Kitchen escitalopram (LEXAPRO) 20 MG tablet Take 20 mg by mouth 2 (two) times daily.      . folic acid (FOLVITE) 1 MG tablet Take 1 mg  by mouth daily.      Marland Kitchen HYDROcodone-acetaminophen (NORCO/VICODIN) 5-325 MG per tablet Take 1-2 tablets by mouth every 4 (four) hours as needed. For pain      . insulin aspart (NOVOLOG) 100 UNIT/ML injection Inject 2-15 Units into the skin 3 (three) times daily before meals. Per sliding scale  70-100 : 0 units, 101-150 :2 units, 151-200 :3 units, 201-250 : 6 units, 251-300: 9 units, 301-350: 12 units  Greater than 350  15 units      . insulin aspart (NOVOLOG) 100 UNIT/ML injection Inject 2-5 Units into the skin at bedtime. 201-250: 2 units, 251-300 : 3 units, 301-350: 4 units,  351: 5 units  Call MD > 450      . insulin glargine (LANTUS) 100 UNIT/ML injection Inject 35 Units into the skin daily.       Marland Kitchen lisinopril-hydrochlorothiazide (PRINZIDE,ZESTORETIC) 20-12.5 MG per tablet Take 1 tablet by mouth daily.      . Multiple Vitamin (MULTIVITAMIN WITH MINERALS) TABS Take 1 tablet by mouth daily.      Marland Kitchen omeprazole (PRILOSEC) 20 MG capsule Take 20 mg by  mouth daily.      . polyethylene glycol (MIRALAX / GLYCOLAX) packet Take 17 g by mouth daily.       . simvastatin (ZOCOR) 40 MG tablet Take 40 mg by mouth every evening.      . vitamin B-12 (CYANOCOBALAMIN) 1000 MCG tablet Take 1,000 mcg by mouth daily.      . vitamin C (ASCORBIC ACID) 500 MG tablet Take 500 mg by mouth daily. For wound healing for 28 days start 04-17-12 end 05-16-12      . zinc gluconate 50 MG tablet Take 50 mg by mouth daily.        Results for orders placed during the hospital encounter of 05/09/12 (from the past 48 hour(s))  BASIC METABOLIC PANEL     Status: Abnormal   Collection Time   05/09/12  2:43 PM      Component Value Range Comment   Sodium 140  135 - 145 mEq/L    Potassium 3.2 (*) 3.5 - 5.1 mEq/L    Chloride 100  96 - 112 mEq/L    CO2 31  19 - 32 mEq/L    Glucose, Bld 102 (*) 70 - 99 mg/dL    BUN 27 (*) 6 - 23 mg/dL    Creatinine, Ser 1.61  0.50 - 1.10 mg/dL    Calcium 9.5  8.4 - 09.6 mg/dL    GFR calc non Af Amer 71  (*) >90 mL/min    GFR calc Af Amer 82 (*) >90 mL/min   CBC     Status: Abnormal   Collection Time   05/09/12  2:43 PM      Component Value Range Comment   WBC 9.1  4.0 - 10.5 K/uL    RBC 3.95  3.87 - 5.11 MIL/uL    Hemoglobin 11.8 (*) 12.0 - 15.0 g/dL    HCT 04.5 (*) 40.9 - 46.0 %    MCV 87.8  78.0 - 100.0 fL    MCH 29.9  26.0 - 34.0 pg    MCHC 34.0  30.0 - 36.0 g/dL    RDW 81.1  91.4 - 78.2 %    Platelets 238  150 - 400 K/uL   GLUCOSE, CAPILLARY     Status: Abnormal   Collection Time   05/09/12  2:43 PM      Component Value Range Comment   Glucose-Capillary 103 (*) 70 - 99 mg/dL    Dg Chest 2 View  95/11/2128  *RADIOLOGY REPORT*  Clinical Data: Preop, hypertension, former smoking history  CHEST - 2 VIEW  Comparison: Portable chest x-ray of 04/14/2012  Findings: Bibasilar linear scarring or atelectasis is noted primarily in the right middle lobe and lingula.  No infiltrate or effusion is seen.  Cardiomegaly is stable.  No bony abnormality is noted.  IMPRESSION: Stable cardiomegaly.  Mild bibasilar linear atelectasis or scarring.   Original Report Authenticated By: Dwyane Dee, M.D.     ROS  No recent f/c/nv/wt loss  Blood pressure 103/68, pulse 70, temperature 97.8 F (36.6 C), temperature source Oral, resp. rate 20, height 5\' 8"  (1.727 m), weight 82.1 kg (181 lb), SpO2 99.00%. Physical Exam  wn wd woman in  [place].  A and O x 4.  MOod and affect normal.  EOMI.  Respirations unlabored.  L ankle with normal vascular exam.  Decreased sens to LT distally.  No lymphadenopathy.  Blisters are all dry.  Skin o/w healthy.  55/ strength in PF and DF of the ankle and  toes.  Assessment/Plan Left ankle fracture - to OR for ORIF.  The risks and benefits of the alternative treatment options have been discussed in detail.  The patient wishes to proceed with surgery and specifically understands risks of bleeding, infection, nerve damage, blood clots, need for additional surgery, amputation and  death.   Toni Arthurs 2012-05-11, 4:14 PM

## 2012-05-09 NOTE — Transfer of Care (Signed)
Immediate Anesthesia Transfer of Care Note  [name]  Procedure(s) Performed: Procedure(s) (LRB) with comments: OPEN REDUCTION INTERNAL FIXATION (ORIF) ANKLE FRACTURE (Left) - ORIF VERSES EXFIX LEFT ANKLE FRACTURE  Patient Location: PACU  Anesthesia Type:General  Level of Consciousness: awake and alert   Airway & Oxygen Therapy: Patient Spontanous Breathing and Patient connected to nasal cannula oxygen  Post-op Assessment: Report given to PACU RN and Post -op Vital signs reviewed and stable  Post vital signs: Reviewed and stable  Complications: No apparent anesthesia complications

## 2012-05-09 NOTE — Anesthesia Preprocedure Evaluation (Addendum)
Anesthesia Evaluation  Patient identified by MRN, date of birth, ID band Patient awake    Reviewed: Allergy & Precautions, H&P , NPO status , Patient's Chart, lab work & pertinent test results  History of Anesthesia Complications Negative for: history of anesthetic complications  Airway Mallampati: II TM Distance: >3 FB Neck ROM: Full    Dental No notable dental hx. (+) Teeth Intact and Dental Advisory Given   Pulmonary Current Smoker,  breath sounds clear to auscultation  Pulmonary exam normal       Cardiovascular hypertension, Pt. on medications Rhythm:Regular Rate:Normal     Neuro/Psych TIA   GI/Hepatic Neg liver ROS, GERD-  Medicated and Controlled,  Endo/Other  diabetes (glu 87), Type 1, Insulin DependentMorbid obesity  Renal/GU negative Renal ROS     Musculoskeletal   Abdominal (+) + obese,   Peds  Hematology   Anesthesia Other Findings   Reproductive/Obstetrics                           Anesthesia Physical Anesthesia Plan  ASA: III  Anesthesia Plan: General   Post-op Pain Management:    Induction: Intravenous  Airway Management Planned: LMA  Additional Equipment:   Intra-op Plan:   Post-operative Plan:   Informed Consent: I have reviewed the patients History and Physical, chart, labs and discussed the procedure including the risks, benefits and alternatives for the proposed anesthesia with the patient or authorized representative who has indicated his/her understanding and acceptance.   Dental advisory given  Plan Discussed with: CRNA and Surgeon  Anesthesia Plan Comments: (Plan routine monitors, GA- LMA OK, Dr. Victorino Dike declines popliteal block for post op analgesia )       Anesthesia Quick Evaluation

## 2012-05-09 NOTE — Preoperative (Signed)
Beta Blockers   Reason not to administer Beta Blockers:Not Applicable 

## 2012-05-10 ENCOUNTER — Encounter (HOSPITAL_COMMUNITY): Payer: Self-pay | Admitting: *Deleted

## 2012-05-10 DIAGNOSIS — Z794 Long term (current) use of insulin: Secondary | ICD-10-CM | POA: Diagnosis not present

## 2012-05-10 DIAGNOSIS — Z5189 Encounter for other specified aftercare: Secondary | ICD-10-CM | POA: Diagnosis not present

## 2012-05-10 DIAGNOSIS — F411 Generalized anxiety disorder: Secondary | ICD-10-CM | POA: Diagnosis not present

## 2012-05-10 DIAGNOSIS — S52599A Other fractures of lower end of unspecified radius, initial encounter for closed fracture: Secondary | ICD-10-CM | POA: Diagnosis not present

## 2012-05-10 DIAGNOSIS — S8290XD Unspecified fracture of unspecified lower leg, subsequent encounter for closed fracture with routine healing: Secondary | ICD-10-CM | POA: Diagnosis not present

## 2012-05-10 DIAGNOSIS — Z4789 Encounter for other orthopedic aftercare: Secondary | ICD-10-CM | POA: Diagnosis not present

## 2012-05-10 DIAGNOSIS — E119 Type 2 diabetes mellitus without complications: Secondary | ICD-10-CM | POA: Diagnosis not present

## 2012-05-10 DIAGNOSIS — Z9181 History of falling: Secondary | ICD-10-CM | POA: Diagnosis not present

## 2012-05-10 DIAGNOSIS — M6281 Muscle weakness (generalized): Secondary | ICD-10-CM | POA: Diagnosis not present

## 2012-05-10 DIAGNOSIS — S82843A Displaced bimalleolar fracture of unspecified lower leg, initial encounter for closed fracture: Secondary | ICD-10-CM | POA: Diagnosis not present

## 2012-05-10 DIAGNOSIS — I10 Essential (primary) hypertension: Secondary | ICD-10-CM | POA: Diagnosis not present

## 2012-05-10 DIAGNOSIS — R262 Difficulty in walking, not elsewhere classified: Secondary | ICD-10-CM | POA: Diagnosis not present

## 2012-05-10 DIAGNOSIS — F3189 Other bipolar disorder: Secondary | ICD-10-CM | POA: Diagnosis not present

## 2012-05-10 LAB — GLUCOSE, CAPILLARY
Glucose-Capillary: 172 mg/dL — ABNORMAL HIGH (ref 70–99)
Glucose-Capillary: 392 mg/dL — ABNORMAL HIGH (ref 70–99)
Glucose-Capillary: 330 mg/dL — ABNORMAL HIGH (ref 70–99)
Glucose-Capillary: 360 mg/dL — ABNORMAL HIGH (ref 70–99)

## 2012-05-10 MED ORDER — OXYCODONE HCL 5 MG PO TABS: 5.0000 mg | ORAL_TABLET | ORAL | Status: DC | PRN

## 2012-05-10 MED ORDER — ONDANSETRON HCL 4 MG PO TABS: 4.0000 mg | ORAL_TABLET | Freq: Four times a day (QID) | ORAL | Status: DC | PRN

## 2012-05-10 MED ORDER — ASPIRIN EC 325 MG PO TBEC: 325.0000 mg | DELAYED_RELEASE_TABLET | Freq: Every day | ORAL | Status: DC

## 2012-05-10 NOTE — Op Note (Signed)
NAMEKADEN, DUNKEL NO.:  [id number]  MEDICAL RECORD NO.:  [id number]  LOCATION:  [id number]                        FACILITY:  [facility]  PHYSICIAN:  Toni Arthurs, MD        DATE OF BIRTH:  07-Mar-1956  DATE OF PROCEDURE:  05/09/2012 DATE OF DISCHARGE:                              OPERATIVE REPORT   PREOPERATIVE DIAGNOSIS:  Left ankle bimalleolar fracture.  POSTOPERATIVE DIAGNOSIS:  Left ankle bimalleolar fracture.  PROCEDURE: 1. Open reduction and internal fixation of left ankle bimalleolar     fracture. 2. Intraoperative interpretation of fluoroscopic imaging.  SURGEON:  Toni Arthurs, MD  ANESTHESIA:  General.  ESTIMATED BLOOD LOSS:  Minimal.  TOURNIQUET TIME:  60 minutes at 300 mmHg.  COMPLICATIONS:  None apparent.  DISPOSITION:  Extubated, awake and stable to recovery.  INDICATIONS FOR PROCEDURE:  The patient is a 56 year old female with past medical history significant for stroke and diabetes.  She is now approximately 2-1/2 weeks ago at home injuring her left ankle.  There was a fracture-dislocation that was reduced in the operating room under anesthesia, it was splinted.  She had significant fracture blisters. So, surgery was delayed until this time.  She presents now for operative treatment of this injury.  She understands the risks, benefits, alternative treatment options and elects surgical treatment.  She specifically understands the risks of bleeding, infection, nerve damage, blood clots, need for additional surgery, amputation, and death.  PROCEDURE IN DETAIL:  After preoperative consent was obtained and the correct operative site was identified, the patient was brought to the operating room and placed supine on the operating table.  General anesthesia was induced.  Preoperative antibiotics were administered. Surgical time-out was taken.  The left lower extremity was prepped and draped in standard sterile fashion with a tourniquet around the  thigh. The extremity was exsanguinated and the tourniquet was inflated to 300 mmHg.  A longitudinal incision was made on the lateral aspect of the ankle over the fracture site.  Sharp dissection was carried down through the skin and subcutaneous tissue.  The fracture was identified, the fracture was mobilized and cleaned of all fibrous tissue and bony healing.  Fracture was not reducible, so attention was turned to the medial side of the ankle.  A medial longitudinal incision was made over the malleolus.  Sharp dissection was carried down through the skin and subcutaneous tissue. Medial malleolus fragment was mobilized and an anterior arthrotomy was made.  Fibrous tissue was cleared from the medial gutter.  The fracture was mobilized by arterial dissection across the anterior joint capsule off.  At this point, the talus to be reduced to an appropriate position underneath the distal tibia; however, it spontaneously displaced laterally making reduction of the fibula impossible and 3 inches Steinmann pin was inserted through the sole of the foot and driven across the subtalar joint and across the tibiotalar joint by the holding the ankle in a reduced position.  This provisionally reduced the talus allowing anatomic reduction of the fibular fracture.  Once the fibula was clamped with a lobster claw, a 3.5-mm fully-threaded lag screw was inserted from posterior to anterior perpendicular to the fracture  site. A locking plate was selected from the [employer], composite plates.  It was contoured to fit the lateral fibula.  It was fixed proximally with a bicortical screw in the oblong hole.  It was positioned appropriately and fixed distally with a nonlocking screw.  The screw was pulled the plate down securely to the lateral aspect of the fibula.  Two more bicortical screws were inserted proximal to the fracture and some more unicortical screws were inserted distal to the fracture.  Attention was  then returned to the medial malleolus.  The medial malleolus fracture was reduced and few 1.6-mm K-wires were inserted.  AP and lateral fluoroscopic images confirmed appropriate reduction of the medial malleolus fragment.  Two 4-mm x 44-mm partially-threaded cancellous cannulated screws were then inserted over the guidewires and noted to hold the medial malleolus in appropriately reduced position. Both wounds were then irrigated copiously.  Final AP, mortise, and lateral views of the ankle showed appropriate reduction of the fractures and appropriate position and length of all hardware.  Laterally, the deep subcutaneous tissue was approximated over the plate with inverted simple sutures of 0 Vicryl.  Subcutaneous tissue was approximated with inverted simple sutures of 3-0 Monocryl, and finally, 3-0 nylon was used to close the incision.  Medially, the deep tissues were approximated with inverted Monocryl sutures and the skin was closed with the running 3-0 nylon.  Sterile dressings were applied followed by a well paded splint.  The tourniquet was release at 60 min after application of the dressings.  The pt was awakened from anesthesia and trasported to the recover room in stable condition.  Follow UP PLan:  The pt will be observed overnight for pain control and will return to her SNF tomorrow.  She'll be NWB on the L LE for at least 6 weeks.     Toni Arthurs, MD     JH/MEDQ  D:  05/09/2012  T:  05/10/2012  Job:  [id number]

## 2012-05-10 NOTE — Progress Notes (Signed)
Pt discharged to [facility] with discharge instructions and prescriptions. IN stable condition with no complaints.

## 2012-05-10 NOTE — Discharge Summary (Signed)
Physician Discharge Summary  Patient ID: Chloe Owens MRN: [id number] DOB/AGE: August 03, 1955 56 y.o.  Admit date: 05/09/2012 Discharge date: 05/10/2012  Admission Diagnoses:  Left ankle fracture, h/o stroke, brittle diabetes  Discharge Diagnoses:  Same s/p ORIF Left ankle  Discharged Condition: stable  Hospital Course: Pt was admitted yesterday for ORIF of her left ankle.  She tolerated the procedure well and was discharged back to the inpt ward.  She is discahrged back to the SNF today.  She'll be NWB on the R LE for at least 8 weeks.  She'll need continued PT and OT for her SNF duration.  Consults: None  Significant Diagnostic Studies: none  Treatments: surgery: ORIF L ankle  Discharge Exam: Blood pressure 135/78, pulse 92, temperature 97.9 F (36.6 C), temperature source Oral, resp. rate 20, height 5\' 8"  (1.727 m), weight 82.1 kg (181 lb), SpO2 99.00%. L ankle splinted.  Toes with brisk cap refill.  Wiggles toes.  Disposition: 03-Skilled Nursing Facility  Discharge Orders    Future Orders Please Complete By Expires   Diet - low sodium heart healthy      Call MD / Call 911      Comments:   If you experience chest pain or shortness of breath, CALL 911 and be transported to the hospital emergency room.  If you develope a fever above 101 F, pus (white drainage) or increased drainage or redness at the wound, or calf pain, call your surgeon's office.   Constipation Prevention      Comments:   Drink plenty of fluids.  Prune juice may be helpful.  You may use a stool softener, such as Colace (over the counter) 100 mg twice a day.  Use MiraLax (over the counter) for constipation as needed.   Increase activity slowly as tolerated      Non weight bearing      Comments:   Left foot.       Medication List     As of 05/10/2012  7:44 AM    STOP taking these medications         HYDROcodone-acetaminophen 5-325 MG per tablet   Commonly known as: NORCO/VICODIN      TAKE these  medications         ALPRAZolam 0.25 MG tablet   Commonly known as: XANAX   Take 0.25 mg by mouth 2 (two) times daily.      aspirin EC 325 MG tablet   Take 1 tablet (325 mg total) by mouth daily.      calcium-vitamin D 500-200 MG-UNIT per tablet   Commonly known as: OSCAL WITH D   Take 1 tablet by mouth 2 (two) times daily.      cholecalciferol 1000 UNITS tablet   Commonly known as: VITAMIN D   Take 1,000 Units by mouth daily.      divalproex 500 MG 24 hr tablet   Commonly known as: DEPAKOTE ER   Take 500 mg by mouth 2 (two) times daily.      escitalopram 20 MG tablet   Commonly known as: LEXAPRO   Take 20 mg by mouth 2 (two) times daily.      folic acid 1 MG tablet   Commonly known as: FOLVITE   Take 1 mg by mouth daily.      insulin aspart 100 UNIT/ML injection   Commonly known as: novoLOG   Inject 2-15 Units into the skin 3 (three) times daily before meals. Per sliding scale  70-100 : 0  units, 101-150 :2 units, 151-200 :3 units, 201-250 : 6 units, 251-300: 9 units, 301-350: 12 units  Greater than 350  15 units      insulin aspart 100 UNIT/ML injection   Commonly known as: novoLOG   Inject 2-5 Units into the skin at bedtime. 201-250: 2 units, 251-300 : 3 units, 301-350: 4 units,  351: 5 units  Call MD > 450      insulin glargine 100 UNIT/ML injection   Commonly known as: LANTUS   Inject 35 Units into the skin daily.      lisinopril-hydrochlorothiazide 20-12.5 MG per tablet   Commonly known as: PRINZIDE,ZESTORETIC   Take 1 tablet by mouth daily.      multivitamin with minerals Tabs   Take 1 tablet by mouth daily.      omeprazole 20 MG capsule   Commonly known as: PRILOSEC   Take 20 mg by mouth daily.      ondansetron 4 MG tablet   Commonly known as: ZOFRAN   Take 1 tablet (4 mg total) by mouth every 6 (six) hours as needed for nausea.      oxyCODONE 5 MG immediate release tablet   Commonly known as: Oxy IR/ROXICODONE   Take 1-2 tablets (5-10 mg total) by  mouth every 3 (three) hours as needed.      polyethylene glycol packet   Commonly known as: MIRALAX / GLYCOLAX   Take 17 g by mouth daily.      simvastatin 40 MG tablet   Commonly known as: ZOCOR   Take 40 mg by mouth every evening.      vitamin B-12 1000 MCG tablet   Commonly known as: CYANOCOBALAMIN   Take 1,000 mcg by mouth daily.      vitamin C 500 MG tablet   Commonly known as: ASCORBIC ACID   Take 500 mg by mouth daily. For wound healing for 28 days start 04-17-12 end 05-16-12      zinc gluconate 50 MG tablet   Take 50 mg by mouth daily.           Follow-up Information    Follow up with Demarius Archila, Jonny Ruiz, MD. Schedule an appointment as soon as possible for a visit in 2 weeks.   Contact information:   [address] [place] [address] [phone]          Signed: Toni Arthurs 05/10/2012, 7:44 AM

## 2012-05-10 NOTE — Anesthesia Postprocedure Evaluation (Signed)
  Anesthesia Post-op Note  [name]  Procedure(s) Performed: Procedure(s) (LRB) with comments: OPEN REDUCTION INTERNAL FIXATION (ORIF) ANKLE FRACTURE (Left) - ORIF VERSES EXFIX LEFT ANKLE FRACTURE  Patient Location: Nursing Unit  Anesthesia Type:General  Level of Consciousness: awake, alert  and oriented  Airway and Oxygen Therapy: Patient Spontanous Breathing and Patient connected to nasal cannula oxygen  Post-op Pain: none  Post-op Assessment: Post-op Vital signs reviewed, Patient's Cardiovascular Status Stable, Respiratory Function Stable, Patent Airway, No signs of Nausea or vomiting, Adequate PO intake and Pain level controlled  Post-op Vital Signs: Reviewed and stable  Complications: No apparent anesthesia complications and being discharged to SNF today

## 2012-10-18 DIAGNOSIS — M79609 Pain in unspecified limb: Secondary | ICD-10-CM | POA: Diagnosis not present

## 2012-10-18 DIAGNOSIS — S8290XD Unspecified fracture of unspecified lower leg, subsequent encounter for closed fracture with routine healing: Secondary | ICD-10-CM | POA: Diagnosis not present

## 2013-07-14 DIAGNOSIS — I6789 Other cerebrovascular disease: Secondary | ICD-10-CM | POA: Diagnosis not present

## 2013-07-14 DIAGNOSIS — IMO0001 Reserved for inherently not codable concepts without codable children: Secondary | ICD-10-CM | POA: Diagnosis not present

## 2013-07-14 DIAGNOSIS — N189 Chronic kidney disease, unspecified: Secondary | ICD-10-CM | POA: Diagnosis not present

## 2013-07-14 DIAGNOSIS — F17200 Nicotine dependence, unspecified, uncomplicated: Secondary | ICD-10-CM | POA: Diagnosis not present

## 2013-07-14 DIAGNOSIS — E782 Mixed hyperlipidemia: Secondary | ICD-10-CM | POA: Diagnosis not present

## 2013-07-14 DIAGNOSIS — IMO0002 Reserved for concepts with insufficient information to code with codable children: Secondary | ICD-10-CM | POA: Diagnosis not present

## 2013-07-14 DIAGNOSIS — I10 Essential (primary) hypertension: Secondary | ICD-10-CM | POA: Diagnosis not present

## 2013-07-21 DIAGNOSIS — F17200 Nicotine dependence, unspecified, uncomplicated: Secondary | ICD-10-CM | POA: Diagnosis not present

## 2013-07-21 DIAGNOSIS — IMO0001 Reserved for inherently not codable concepts without codable children: Secondary | ICD-10-CM | POA: Diagnosis not present

## 2013-07-21 DIAGNOSIS — I10 Essential (primary) hypertension: Secondary | ICD-10-CM | POA: Diagnosis not present

## 2013-07-21 DIAGNOSIS — IMO0002 Reserved for concepts with insufficient information to code with codable children: Secondary | ICD-10-CM | POA: Diagnosis not present

## 2013-07-21 DIAGNOSIS — E782 Mixed hyperlipidemia: Secondary | ICD-10-CM | POA: Diagnosis not present

## 2013-07-21 DIAGNOSIS — Z1331 Encounter for screening for depression: Secondary | ICD-10-CM | POA: Diagnosis not present

## 2013-07-21 DIAGNOSIS — N183 Chronic kidney disease, stage 3 unspecified: Secondary | ICD-10-CM | POA: Diagnosis not present

## 2013-07-21 DIAGNOSIS — I6789 Other cerebrovascular disease: Secondary | ICD-10-CM | POA: Diagnosis not present

## 2013-08-04 DIAGNOSIS — N390 Urinary tract infection, site not specified: Secondary | ICD-10-CM | POA: Diagnosis not present

## 2013-08-04 DIAGNOSIS — I10 Essential (primary) hypertension: Secondary | ICD-10-CM | POA: Diagnosis not present

## 2013-08-04 DIAGNOSIS — N281 Cyst of kidney, acquired: Secondary | ICD-10-CM | POA: Diagnosis not present

## 2013-08-04 DIAGNOSIS — T887XXA Unspecified adverse effect of drug or medicament, initial encounter: Secondary | ICD-10-CM | POA: Diagnosis not present

## 2013-08-05 DIAGNOSIS — E876 Hypokalemia: Secondary | ICD-10-CM | POA: Diagnosis not present

## 2013-08-05 DIAGNOSIS — R109 Unspecified abdominal pain: Secondary | ICD-10-CM | POA: Diagnosis not present

## 2013-08-05 DIAGNOSIS — S0990XA Unspecified injury of head, initial encounter: Secondary | ICD-10-CM | POA: Diagnosis not present

## 2013-08-05 DIAGNOSIS — N390 Urinary tract infection, site not specified: Secondary | ICD-10-CM | POA: Diagnosis not present

## 2013-08-06 DIAGNOSIS — N390 Urinary tract infection, site not specified: Secondary | ICD-10-CM | POA: Diagnosis not present

## 2013-08-06 DIAGNOSIS — E876 Hypokalemia: Secondary | ICD-10-CM | POA: Diagnosis not present

## 2013-08-07 DIAGNOSIS — N390 Urinary tract infection, site not specified: Secondary | ICD-10-CM | POA: Diagnosis not present

## 2013-08-07 DIAGNOSIS — E876 Hypokalemia: Secondary | ICD-10-CM | POA: Diagnosis not present

## 2013-08-08 DIAGNOSIS — N390 Urinary tract infection, site not specified: Secondary | ICD-10-CM | POA: Diagnosis not present

## 2013-08-08 DIAGNOSIS — E876 Hypokalemia: Secondary | ICD-10-CM | POA: Diagnosis not present

## 2013-08-13 DIAGNOSIS — I6789 Other cerebrovascular disease: Secondary | ICD-10-CM | POA: Diagnosis not present

## 2013-08-13 DIAGNOSIS — N183 Chronic kidney disease, stage 3 unspecified: Secondary | ICD-10-CM | POA: Diagnosis not present

## 2013-08-13 DIAGNOSIS — I10 Essential (primary) hypertension: Secondary | ICD-10-CM | POA: Diagnosis not present

## 2013-08-13 DIAGNOSIS — F17200 Nicotine dependence, unspecified, uncomplicated: Secondary | ICD-10-CM | POA: Diagnosis not present

## 2013-08-13 DIAGNOSIS — N3090 Cystitis, unspecified without hematuria: Secondary | ICD-10-CM | POA: Diagnosis not present

## 2013-08-13 DIAGNOSIS — IMO0002 Reserved for concepts with insufficient information to code with codable children: Secondary | ICD-10-CM | POA: Diagnosis not present

## 2013-08-13 DIAGNOSIS — IMO0001 Reserved for inherently not codable concepts without codable children: Secondary | ICD-10-CM | POA: Diagnosis not present

## 2013-08-13 DIAGNOSIS — E782 Mixed hyperlipidemia: Secondary | ICD-10-CM | POA: Diagnosis not present

## 2013-08-28 DIAGNOSIS — IMO0002 Reserved for concepts with insufficient information to code with codable children: Secondary | ICD-10-CM | POA: Diagnosis not present

## 2013-08-28 DIAGNOSIS — I6789 Other cerebrovascular disease: Secondary | ICD-10-CM | POA: Diagnosis not present

## 2013-08-28 DIAGNOSIS — F17200 Nicotine dependence, unspecified, uncomplicated: Secondary | ICD-10-CM | POA: Diagnosis not present

## 2013-08-28 DIAGNOSIS — E782 Mixed hyperlipidemia: Secondary | ICD-10-CM | POA: Diagnosis not present

## 2013-08-28 DIAGNOSIS — N183 Chronic kidney disease, stage 3 unspecified: Secondary | ICD-10-CM | POA: Diagnosis not present

## 2013-08-28 DIAGNOSIS — I10 Essential (primary) hypertension: Secondary | ICD-10-CM | POA: Diagnosis not present

## 2013-08-28 DIAGNOSIS — IMO0001 Reserved for inherently not codable concepts without codable children: Secondary | ICD-10-CM | POA: Diagnosis not present

## 2013-08-28 DIAGNOSIS — Z1331 Encounter for screening for depression: Secondary | ICD-10-CM | POA: Diagnosis not present

## 2013-09-22 DIAGNOSIS — M19079 Primary osteoarthritis, unspecified ankle and foot: Secondary | ICD-10-CM | POA: Diagnosis not present

## 2013-10-13 DIAGNOSIS — M19079 Primary osteoarthritis, unspecified ankle and foot: Secondary | ICD-10-CM | POA: Diagnosis not present

## 2013-10-15 DIAGNOSIS — J309 Allergic rhinitis, unspecified: Secondary | ICD-10-CM | POA: Diagnosis not present

## 2013-10-15 DIAGNOSIS — J019 Acute sinusitis, unspecified: Secondary | ICD-10-CM | POA: Diagnosis not present

## 2013-12-04 DIAGNOSIS — F17200 Nicotine dependence, unspecified, uncomplicated: Secondary | ICD-10-CM | POA: Diagnosis not present

## 2013-12-23 DIAGNOSIS — E782 Mixed hyperlipidemia: Secondary | ICD-10-CM | POA: Diagnosis not present

## 2013-12-30 DIAGNOSIS — IMO0001 Reserved for inherently not codable concepts without codable children: Secondary | ICD-10-CM | POA: Diagnosis not present

## 2013-12-30 DIAGNOSIS — I10 Essential (primary) hypertension: Secondary | ICD-10-CM | POA: Diagnosis not present

## 2013-12-30 DIAGNOSIS — I6789 Other cerebrovascular disease: Secondary | ICD-10-CM | POA: Diagnosis not present

## 2013-12-30 DIAGNOSIS — F17200 Nicotine dependence, unspecified, uncomplicated: Secondary | ICD-10-CM | POA: Diagnosis not present

## 2013-12-30 DIAGNOSIS — Z1331 Encounter for screening for depression: Secondary | ICD-10-CM | POA: Diagnosis not present

## 2013-12-30 DIAGNOSIS — IMO0002 Reserved for concepts with insufficient information to code with codable children: Secondary | ICD-10-CM | POA: Diagnosis not present

## 2013-12-30 DIAGNOSIS — E782 Mixed hyperlipidemia: Secondary | ICD-10-CM | POA: Diagnosis not present

## 2014-01-14 DIAGNOSIS — N3000 Acute cystitis without hematuria: Secondary | ICD-10-CM | POA: Diagnosis not present

## 2014-01-14 DIAGNOSIS — IMO0001 Reserved for inherently not codable concepts without codable children: Secondary | ICD-10-CM | POA: Diagnosis not present

## 2014-02-24 DIAGNOSIS — I6789 Other cerebrovascular disease: Secondary | ICD-10-CM | POA: Diagnosis not present

## 2014-02-24 DIAGNOSIS — E782 Mixed hyperlipidemia: Secondary | ICD-10-CM | POA: Diagnosis not present

## 2014-02-24 DIAGNOSIS — Z1331 Encounter for screening for depression: Secondary | ICD-10-CM | POA: Diagnosis not present

## 2014-02-24 DIAGNOSIS — IMO0001 Reserved for inherently not codable concepts without codable children: Secondary | ICD-10-CM | POA: Diagnosis not present

## 2014-02-24 DIAGNOSIS — F17200 Nicotine dependence, unspecified, uncomplicated: Secondary | ICD-10-CM | POA: Diagnosis not present

## 2014-02-24 DIAGNOSIS — Z23 Encounter for immunization: Secondary | ICD-10-CM | POA: Diagnosis not present

## 2014-02-24 DIAGNOSIS — I10 Essential (primary) hypertension: Secondary | ICD-10-CM | POA: Diagnosis not present

## 2014-02-24 DIAGNOSIS — IMO0002 Reserved for concepts with insufficient information to code with codable children: Secondary | ICD-10-CM | POA: Diagnosis not present

## 2014-04-03 DIAGNOSIS — I10 Essential (primary) hypertension: Secondary | ICD-10-CM | POA: Diagnosis not present

## 2014-04-03 DIAGNOSIS — N183 Chronic kidney disease, stage 3 (moderate): Secondary | ICD-10-CM | POA: Diagnosis not present

## 2014-04-03 DIAGNOSIS — E1165 Type 2 diabetes mellitus with hyperglycemia: Secondary | ICD-10-CM | POA: Diagnosis not present

## 2014-04-03 DIAGNOSIS — E782 Mixed hyperlipidemia: Secondary | ICD-10-CM | POA: Diagnosis not present

## 2014-04-03 DIAGNOSIS — F3132 Bipolar disorder, current episode depressed, moderate: Secondary | ICD-10-CM | POA: Diagnosis not present

## 2014-04-03 DIAGNOSIS — J301 Allergic rhinitis due to pollen: Secondary | ICD-10-CM | POA: Diagnosis not present

## 2014-04-03 DIAGNOSIS — Z6829 Body mass index (BMI) 29.0-29.9, adult: Secondary | ICD-10-CM | POA: Diagnosis not present

## 2014-04-03 DIAGNOSIS — F17210 Nicotine dependence, cigarettes, uncomplicated: Secondary | ICD-10-CM | POA: Diagnosis not present

## 2014-04-13 ENCOUNTER — Encounter (INDEPENDENT_AMBULATORY_CARE_PROVIDER_SITE_OTHER): Payer: Self-pay | Admitting: Ophthalmology

## 2014-04-15 DIAGNOSIS — F3132 Bipolar disorder, current episode depressed, moderate: Secondary | ICD-10-CM | POA: Diagnosis not present

## 2014-04-15 DIAGNOSIS — N183 Chronic kidney disease, stage 3 (moderate): Secondary | ICD-10-CM | POA: Diagnosis not present

## 2014-04-15 DIAGNOSIS — F17210 Nicotine dependence, cigarettes, uncomplicated: Secondary | ICD-10-CM | POA: Diagnosis not present

## 2014-04-15 DIAGNOSIS — J301 Allergic rhinitis due to pollen: Secondary | ICD-10-CM | POA: Diagnosis not present

## 2014-04-15 DIAGNOSIS — Z6829 Body mass index (BMI) 29.0-29.9, adult: Secondary | ICD-10-CM | POA: Diagnosis not present

## 2014-04-15 DIAGNOSIS — E1122 Type 2 diabetes mellitus with diabetic chronic kidney disease: Secondary | ICD-10-CM | POA: Diagnosis not present

## 2014-04-15 DIAGNOSIS — I10 Essential (primary) hypertension: Secondary | ICD-10-CM | POA: Diagnosis not present

## 2014-04-15 DIAGNOSIS — E782 Mixed hyperlipidemia: Secondary | ICD-10-CM | POA: Diagnosis not present

## 2014-04-27 DIAGNOSIS — I10 Essential (primary) hypertension: Secondary | ICD-10-CM | POA: Diagnosis not present

## 2014-04-27 DIAGNOSIS — E1165 Type 2 diabetes mellitus with hyperglycemia: Secondary | ICD-10-CM | POA: Diagnosis not present

## 2014-04-27 DIAGNOSIS — E782 Mixed hyperlipidemia: Secondary | ICD-10-CM | POA: Diagnosis not present

## 2014-05-07 DIAGNOSIS — I10 Essential (primary) hypertension: Secondary | ICD-10-CM | POA: Diagnosis not present

## 2014-05-07 DIAGNOSIS — E1165 Type 2 diabetes mellitus with hyperglycemia: Secondary | ICD-10-CM | POA: Diagnosis not present

## 2014-05-07 DIAGNOSIS — E782 Mixed hyperlipidemia: Secondary | ICD-10-CM | POA: Diagnosis not present

## 2014-05-16 DIAGNOSIS — Z880 Allergy status to penicillin: Secondary | ICD-10-CM | POA: Diagnosis not present

## 2014-05-16 DIAGNOSIS — K219 Gastro-esophageal reflux disease without esophagitis: Secondary | ICD-10-CM | POA: Diagnosis not present

## 2014-05-16 DIAGNOSIS — Z792 Long term (current) use of antibiotics: Secondary | ICD-10-CM | POA: Diagnosis not present

## 2014-05-16 DIAGNOSIS — Z7982 Long term (current) use of aspirin: Secondary | ICD-10-CM | POA: Diagnosis not present

## 2014-05-16 DIAGNOSIS — N390 Urinary tract infection, site not specified: Secondary | ICD-10-CM | POA: Diagnosis not present

## 2014-05-16 DIAGNOSIS — F17200 Nicotine dependence, unspecified, uncomplicated: Secondary | ICD-10-CM | POA: Diagnosis not present

## 2014-05-16 DIAGNOSIS — E785 Hyperlipidemia, unspecified: Secondary | ICD-10-CM | POA: Diagnosis not present

## 2014-05-16 DIAGNOSIS — Z794 Long term (current) use of insulin: Secondary | ICD-10-CM | POA: Diagnosis not present

## 2014-05-16 DIAGNOSIS — Z885 Allergy status to narcotic agent status: Secondary | ICD-10-CM | POA: Diagnosis not present

## 2014-05-16 DIAGNOSIS — Z79899 Other long term (current) drug therapy: Secondary | ICD-10-CM | POA: Diagnosis not present

## 2014-05-16 DIAGNOSIS — E119 Type 2 diabetes mellitus without complications: Secondary | ICD-10-CM | POA: Diagnosis not present

## 2014-05-16 DIAGNOSIS — R531 Weakness: Secondary | ICD-10-CM | POA: Diagnosis not present

## 2014-05-16 DIAGNOSIS — Z8673 Personal history of transient ischemic attack (TIA), and cerebral infarction without residual deficits: Secondary | ICD-10-CM | POA: Diagnosis not present

## 2014-05-16 DIAGNOSIS — F319 Bipolar disorder, unspecified: Secondary | ICD-10-CM | POA: Diagnosis not present

## 2014-05-17 DIAGNOSIS — N3000 Acute cystitis without hematuria: Secondary | ICD-10-CM | POA: Diagnosis not present

## 2014-05-23 ENCOUNTER — Emergency Department (HOSPITAL_COMMUNITY)
Admission: EM | Admit: 2014-05-23 | Discharge: 2014-05-23 | Disposition: A | Discharge status: Home / Self Care | Payer: BC Managed Care – PPO | Attending: Emergency Medicine | Admitting: Emergency Medicine

## 2014-05-23 ENCOUNTER — Encounter (HOSPITAL_COMMUNITY): Payer: Self-pay | Admitting: Emergency Medicine

## 2014-05-23 DIAGNOSIS — Z87891 Personal history of nicotine dependence: Secondary | ICD-10-CM | POA: Diagnosis not present

## 2014-05-23 DIAGNOSIS — E785 Hyperlipidemia, unspecified: Secondary | ICD-10-CM | POA: Diagnosis not present

## 2014-05-23 DIAGNOSIS — F32A Depression, unspecified: Secondary | ICD-10-CM

## 2014-05-23 DIAGNOSIS — Z8673 Personal history of transient ischemic attack (TIA), and cerebral infarction without residual deficits: Secondary | ICD-10-CM | POA: Diagnosis not present

## 2014-05-23 DIAGNOSIS — Z880 Allergy status to penicillin: Secondary | ICD-10-CM | POA: Diagnosis not present

## 2014-05-23 DIAGNOSIS — F329 Major depressive disorder, single episode, unspecified: Secondary | ICD-10-CM | POA: Diagnosis not present

## 2014-05-23 DIAGNOSIS — Z79899 Other long term (current) drug therapy: Secondary | ICD-10-CM | POA: Insufficient documentation

## 2014-05-23 DIAGNOSIS — E119 Type 2 diabetes mellitus without complications: Secondary | ICD-10-CM | POA: Diagnosis not present

## 2014-05-23 DIAGNOSIS — I10 Essential (primary) hypertension: Secondary | ICD-10-CM | POA: Insufficient documentation

## 2014-05-23 DIAGNOSIS — K219 Gastro-esophageal reflux disease without esophagitis: Secondary | ICD-10-CM | POA: Insufficient documentation

## 2014-05-23 DIAGNOSIS — R112 Nausea with vomiting, unspecified: Secondary | ICD-10-CM | POA: Diagnosis not present

## 2014-05-23 DIAGNOSIS — Z8669 Personal history of other diseases of the nervous system and sense organs: Secondary | ICD-10-CM | POA: Insufficient documentation

## 2014-05-23 DIAGNOSIS — R109 Unspecified abdominal pain: Secondary | ICD-10-CM | POA: Insufficient documentation

## 2014-05-23 DIAGNOSIS — R5383 Other fatigue: Secondary | ICD-10-CM

## 2014-05-23 DIAGNOSIS — Z7982 Long term (current) use of aspirin: Secondary | ICD-10-CM | POA: Diagnosis not present

## 2014-05-23 DIAGNOSIS — Z794 Long term (current) use of insulin: Secondary | ICD-10-CM | POA: Insufficient documentation

## 2014-05-23 LAB — COMPREHENSIVE METABOLIC PANEL
ALT: 10 U/L (ref 0–35)
AST: 10 U/L (ref 0–37)
Albumin: 3.5 g/dL (ref 3.5–5.2)
Alkaline Phosphatase: 77 U/L (ref 39–117)
Anion gap: 15 (ref 5–15)
BUN: 13 mg/dL (ref 6–23)
CO2: 26 mEq/L (ref 19–32)
Calcium: 9.5 mg/dL (ref 8.4–10.5)
Chloride: 98 mEq/L (ref 96–112)
Creatinine, Ser: 0.74 mg/dL (ref 0.50–1.10)
GFR calc Af Amer: >90 mL/min (ref >90)
GFR calc non Af Amer: >90 mL/min (ref >90)
Glucose, Bld: 368 mg/dL — ABNORMAL HIGH (ref 70–99)
Potassium: 3.3 mEq/L — ABNORMAL LOW (ref 3.7–5.3)
Sodium: 139 mEq/L (ref 137–147)
Total Bilirubin: 0.4 mg/dL (ref 0.3–1.2)
Total Protein: 7.1 g/dL (ref 6.0–8.3)

## 2014-05-23 LAB — CBC WITH DIFFERENTIAL/PLATELET
Basophils Absolute: 0 10*3/uL (ref 0.0–0.1)
Basophils Relative: 0 % (ref 0–1)
Eosinophils Absolute: 0.1 10*3/uL (ref 0.0–0.7)
Eosinophils Relative: 1 % (ref 0–5)
HCT: 39.4 % (ref 36.0–46.0)
Hemoglobin: 13.6 g/dL (ref 12.0–15.0)
Lymphocytes Relative: 12 % (ref 12–46)
Lymphs Abs: 1.8 10*3/uL (ref 0.7–4.0)
MCH: 28.2 pg (ref 26.0–34.0)
MCHC: 34.5 g/dL (ref 30.0–36.0)
MCV: 81.7 fL (ref 78.0–100.0)
Monocytes Absolute: 0.9 10*3/uL (ref 0.1–1.0)
Monocytes Relative: 6 % (ref 3–12)
Neutro Abs: 11.7 10*3/uL — ABNORMAL HIGH (ref 1.7–7.7)
Neutrophils Relative %: 81 % — ABNORMAL HIGH (ref 43–77)
Platelets: 278 10*3/uL (ref 150–400)
RBC: 4.82 MIL/uL (ref 3.87–5.11)
RDW: 13.9 % (ref 11.5–15.5)
WBC: 14.5 10*3/uL — ABNORMAL HIGH (ref 4.0–10.5)

## 2014-05-23 LAB — URINALYSIS, ROUTINE W REFLEX MICROSCOPIC
Bilirubin Urine: NEGATIVE
Glucose, UA: >1000 mg/dL — AB
Ketones, ur: 15 mg/dL — AB
Leukocytes, UA: NEGATIVE
Nitrite: NEGATIVE
Protein, ur: NEGATIVE mg/dL
Specific Gravity, Urine: 1.044 — ABNORMAL HIGH (ref 1.005–1.030)
Urobilinogen, UA: 0.2 mg/dL (ref 0.0–1.0)
pH: 5 (ref 5.0–8.0)

## 2014-05-23 LAB — LIPASE, BLOOD: Lipase: 22 U/L (ref 11–59)

## 2014-05-23 LAB — URINE MICROSCOPIC-ADD ON

## 2014-05-23 MED ORDER — ONDANSETRON 4 MG PO TBDP
4.0000 mg | ORAL_TABLET | Freq: Once | ORAL | Status: AC
  Administered 2014-05-23: 4 mg via ORAL
  Filled 2014-05-23: qty 1

## 2014-05-23 MED ORDER — ONDANSETRON 4 MG PO TBDP: 4.0000 mg | ORAL_TABLET | ORAL | Status: DC | PRN

## 2014-05-23 NOTE — Discharge Instructions (Signed)
Nausea and Vomiting Nausea is a sick feeling that often comes before throwing up (vomiting). Vomiting is a reflex where stomach contents come out of your mouth. Vomiting can cause severe loss of body fluids (dehydration). Children and elderly adults can become dehydrated quickly, especially if they also have diarrhea. Nausea and vomiting are symptoms of a condition or disease. It is important to find the cause of your symptoms. CAUSES  1. Direct irritation of the stomach lining. This irritation can result from increased acid production (gastroesophageal reflux disease), infection, food poisoning, taking certain medicines (such as nonsteroidal anti-inflammatory drugs), alcohol use, or tobacco use. 2. Signals from the brain.These signals could be caused by a headache, heat exposure, an inner ear disturbance, increased pressure in the brain from injury, infection, a tumor, or a concussion, pain, emotional stimulus, or metabolic problems. 3. An obstruction in the gastrointestinal tract (bowel obstruction). 4. Illnesses such as diabetes, hepatitis, gallbladder problems, appendicitis, kidney problems, cancer, sepsis, atypical symptoms of a heart attack, or eating disorders. 5. Medical treatments such as chemotherapy and radiation. 6. Receiving medicine that makes you sleep (general anesthetic) during surgery. DIAGNOSIS Your caregiver may ask for tests to be done if the problems do not improve after a few days. Tests may also be done if symptoms are severe or if the reason for the nausea and vomiting is not clear. Tests may include:  Urine tests.  Blood tests.  Stool tests.  Cultures (to look for evidence of infection).  X-rays or other imaging studies. Test results can help your caregiver make decisions about treatment or the need for additional tests. TREATMENT You need to stay well hydrated. Drink frequently but in small amounts.You may wish to drink water, sports drinks, clear broth, or eat  frozen ice pops or gelatin dessert to help stay hydrated.When you eat, eating slowly may help prevent nausea.There are also some antinausea medicines that may help prevent nausea. HOME CARE INSTRUCTIONS   Take all medicine as directed by your caregiver.  If you do not have an appetite, do not force yourself to eat. However, you must continue to drink fluids.  If you have an appetite, eat a normal diet unless your caregiver tells you differently.  Eat a variety of complex carbohydrates (rice, wheat, potatoes, bread), lean meats, yogurt, fruits, and vegetables.  Avoid high-fat foods because they are more difficult to digest.  Drink enough water and fluids to keep your urine clear or pale yellow.  If you are dehydrated, ask your caregiver for specific rehydration instructions. Signs of dehydration may include:  Severe thirst.  Dry lips and mouth.  Dizziness.  Dark urine.  Decreasing urine frequency and amount.  Confusion.  Rapid breathing or pulse. SEEK IMMEDIATE MEDICAL CARE IF:   You have blood or brown flecks (like coffee grounds) in your vomit.  You have black or bloody stools.  You have a severe headache or stiff neck.  You are confused.  You have severe abdominal pain.  You have chest pain or trouble breathing.  You do not urinate at least once every 8 hours.  You develop cold or clammy skin.  You continue to vomit for longer than 24 to 48 hours.  You have a fever. MAKE SURE YOU:   Understand these instructions.  Will watch your condition.  Will get help right away if you are not doing well or get worse. Document Released: 05/22/2005 Document Revised: 08/14/2011 Document Reviewed: 10/19/2010 [facility] Patient Information 2015 [employer]Care, [place]. This information is not intended  to replace advice given to you by your health care provider. Make sure you discuss any questions you have with your health care provider. Abdominal Pain Many things can cause  abdominal pain. Usually, abdominal pain is not caused by a disease and will improve without treatment. It can often be observed and treated at home. Your health care provider will do a physical exam and possibly order blood tests and X-rays to help determine the seriousness of your pain. However, in many cases, more time must pass before a clear cause of the pain can be found. Before that point, your health care provider may not know if you need more testing or further treatment. HOME CARE INSTRUCTIONS  Monitor your abdominal pain for any changes. The following actions may help to alleviate any discomfort you are experiencing: 7. Only take over-the-counter or prescription medicines as directed by your health care provider. 8. Do not take laxatives unless directed to do so by your health care provider. 9. Try a clear liquid diet (broth, tea, or water) as directed by your health care provider. Slowly move to a bland diet as tolerated. SEEK MEDICAL CARE IF:  You have unexplained abdominal pain.  You have abdominal pain associated with nausea or diarrhea.  You have pain when you urinate or have a bowel movement.  You experience abdominal pain that wakes you in the night.  You have abdominal pain that is worsened or improved by eating food.  You have abdominal pain that is worsened with eating fatty foods.  You have a fever. SEEK IMMEDIATE MEDICAL CARE IF:   Your pain does not go away within 2 hours.  You keep throwing up (vomiting).  Your pain is felt only in portions of the abdomen, such as the right side or the left lower portion of the abdomen.  You pass bloody or black tarry stools. MAKE SURE YOU:  Understand these instructions.   Will watch your condition.   Will get help right away if you are not doing well or get worse.  Document Released: 03/01/2005 Document Revised: 05/27/2013 Document Reviewed: 01/29/2013 [facility] Patient Information 2015 [place]Care, [place]LLC. This  information is not intended to replace advice given to you by your health care provider. Make sure you discuss any questions you have with your health care provider. Depression Depression refers to feeling sad, low, down in the dumps, blue, gloomy, or empty. In general, there are two kinds of depression: 10. Normal sadness or normal grief. This kind of depression is one that we all feel from time to time after upsetting life experiences, such as the loss of a job or the ending of a relationship. This kind of depression is considered normal, is short lived, and resolves within a few days to 2 weeks. Depression experienced after the loss of a loved one (bereavement) often lasts longer than 2 weeks but normally gets better with time. 11. Clinical depression. This kind of depression lasts longer than normal sadness or normal grief or interferes with your ability to function at home, at work, and in school. It also interferes with your personal relationships. It affects almost every aspect of your life. Clinical depression is an illness. Symptoms of depression can also be caused by conditions other than those mentioned above, such as:  Physical illness. Some physical illnesses, including underactive thyroid gland (hypothyroidism), severe anemia, specific types of cancer, diabetes, uncontrolled seizures, heart and lung problems, strokes, and chronic pain are commonly associated with symptoms of depression.  Side effects of  some prescription medicine. In some people, certain types of medicine can cause symptoms of depression.  Substance abuse. Abuse of alcohol and illicit drugs can cause symptoms of depression. SYMPTOMS Symptoms of normal sadness and normal grief include the following:  Feeling sad or crying for short periods of time.  Not caring about anything (apathy).  Difficulty sleeping or sleeping too much.  No longer able to enjoy the things you used to enjoy.  Desire to be by oneself all the  time (social isolation).  Lack of energy or motivation.  Difficulty concentrating or remembering.  Change in appetite or weight.  Restlessness or agitation. Symptoms of clinical depression include the same symptoms of normal sadness or normal grief and also the following symptoms:  Feeling sad or crying all the time.  Feelings of guilt or worthlessness.  Feelings of hopelessness or helplessness.  Thoughts of suicide or the desire to harm yourself (suicidal ideation).  Loss of touch with reality (psychotic symptoms). Seeing or hearing things that are not real (hallucinations) or having false beliefs about your life or the people around you (delusions and paranoia). DIAGNOSIS  The diagnosis of clinical depression is usually based on how bad the symptoms are and how long they have lasted. Your health care provider will also ask you questions about your medical history and substance use to find out if physical illness, use of prescription medicine, or substance abuse is causing your depression. Your health care provider may also order blood tests. TREATMENT  Often, normal sadness and normal grief do not require treatment. However, sometimes antidepressant medicine is given for bereavement to ease the depressive symptoms until they resolve. The treatment for clinical depression depends on how bad the symptoms are but often includes antidepressant medicine, counseling with a mental health professional, or both. Your health care provider will help to determine what treatment is best for you. Depression caused by physical illness usually goes away with appropriate medical treatment of the illness. If prescription medicine is causing depression, talk with your health care provider about stopping the medicine, decreasing the dose, or changing to another medicine. Depression caused by the abuse of alcohol or illicit drugs goes away when you stop using these substances. Some adults need professional  help in order to stop drinking or using drugs. SEEK IMMEDIATE MEDICAL CARE IF:  You have thoughts about hurting yourself or others.  You lose touch with reality (have psychotic symptoms).  You are taking medicine for depression and have a serious side effect. FOR MORE INFORMATION  National Alliance on Mental Illness: www.nami.[employer] of Mental Health: http://www.maynard.net/ Document Released: 05/19/2000 Document Revised: 10/06/2013 Document Reviewed: 08/21/2011 [facility] Patient Information 2015 [place], [place]. This information is not intended to replace advice given to you by your health care provider. Make sure you discuss any questions you have with your health care provider.  Emergency Department Resource Guide 1) Find a Doctor and Pay Out of Pocket Although you won't have to find out who is covered by your insurance plan, it is a good idea to ask around and get recommendations. You will then need to call the office and see if the doctor you have chosen will accept you as a new patient and what types of options they offer for patients who are self-pay. Some doctors offer discounts or will set up payment plans for their patients who do not have insurance, but you will need to ask so you aren't surprised when you get to your appointment.  2)  [place] Department Not all health departments have doctors that can see patients for sick visits, but many do, so it is worth a call to see if yours does. If you don't know where your local health department is, you can check in your phone book. The CDC also has a tool to help you locate your state's health department, and many state websites also have listings of all of their local health departments.  3) Find a [facility] If your illness is not likely to be very severe or complicated, you may want to try a walk in clinic. These are popping up all over the country in pharmacies, drugstores, and shopping centers.  They're usually staffed by nurse practitioners or physician assistants that have been trained to treat common illnesses and complaints. They're usually fairly quick and inexpensive. However, if you have serious medical issues or chronic medical problems, these are probably not your best option.  No Primary Care Doctor: - Call Health Connect at  [phone] - they can help you locate a primary care doctor that  accepts your insurance, provides certain services, etc. - Physician Referral Service- [phone]  Chronic Pain Problems: Organization         Address  Phone   Notes  [facility]  [phone] Patients need to be referred by their primary care doctor.   Medication Assistance: Organization         Address  Phone   Notes  [facility] Medication [facility] [address] [place], [place] [address] [phone] --Must be a resident of [facility] -- Must have NO insurance coverage whatsoever (no Medicaid/ Medicare, etc.) -- The pt. MUST have a primary care doctor that directs their care regularly and follows them in the community   Med[facility]ist  [phone]   [employer]  [phone]    Agencies that provide inexpensive medical care: Organization         Address  Phone   Notes  [place]  [phone]   [facility]    [phone]   [facility] [place], [place] [address] [phone]   [place] [address] [place] [phone]   Planned Parenthood    ([phone]   [facility]    [phone]   [place] and [place] [address], [place] Phone:  [phone], Fax:  [phone] Hours of Operation:  9 am - 6 pm, M-F.  Also accepts Medicaid/Medicare and self-pay.  [facility] for [place], [address], [place] Phone: [phone], Fax: [phone]. Hours of Operation:  8:30 am - 5:30 pm, M-F.  Also accepts Medicaid and self-pay.  [facility] [address], [place] Phone: [phone]   [place], [place], [place] [phone], Ext. [phone] Mondays & Thursdays: 7-9 AM.  First 15 patients are seen on a first come, first serve basis.    [place] Providers:  Organization         Address  Phone   Notes  [facility] [address], [place] [phone] Also accepts self-pay patients.  [place], [place]  [phone]   [place], [address],  [place] [phone]   [place] [address], [place] [phone]   Lucianne Lei [address], [place]   [phone] Only accepts [place] Access [place] patients after they have their name applied to their card.   Self-Pay (no insurance) in [facility]:  Organization         Address  Phone   Notes  Sickle Cell Patients, [facility] Internal Medicine [place] [phone]   [facility] Urgent Care [place] [phone]   [facility] [place]  [place], [place], [place] [phone]   [facility]/Dr. Osei-Bonsu  [address] [place] or [address], [place] [phone] Phone number for both [place] and [place] locations is the same.  [facility] and [facility] [address], [place] [phone]   [facility] [address], [place] or [address] [phone]   [facility] [address] [place] [phone], phone; [phone], fax Sees patients 1st and 3rd Saturday of every month.  Must not qualify for public or private insurance (i.e.  Medicaid, Medicare, [place] Health Choice, Veterans' Benefits)  Household income should be no more than 200% of the poverty level The clinic cannot treat you if you are pregnant or think you are pregnant  Sexually transmitted diseases are not treated at the clinic.    Dental Care: Organization         Address  Phone  Notes  [facility] Department of [place] [facility] [place] [phone] Accepts children up to age 61 who are enrolled in [place] or [place]; pregnant women with a Medicaid card; and children who have applied for Medicaid or [place] Health Choice, but were declined, whose parents can pay a reduced fee at time of service.  [facility] of [facility]  [address], [place] [phone] Accepts children up to age 38 who are enrolled in [place] or [place]; pregnant women with a Medicaid card; and children who have applied for Medicaid or [place] Health Choice, but were declined, whose parents can pay a reduced fee at time of service.  [facility] Adult [facility] Access PROGRAM  [place] [phone] Patients are seen by appointment only. Walk-ins are not accepted. [place] will see patients 41 years of age and older. Monday - Tuesday (8am-5pm) Most Wednesdays (8:30-5pm) $30 per visit, cash only  [facility] Adult Dental Access PROGRAM  [address], [facility] [phone] Patients are seen by appointment only. Walk-ins are not accepted. [place] will see patients 83 years of age and older. One Wednesday Evening (Monthly: Volunteer Based).  $30 per visit, cash only  [place]  [phone] for adults; Children under age 35, call Graduate Pediatric Dentistry at [phone]. Children aged 10-14, please call [phone] to request a pediatric application.  Dental services are provided in all areas of dental care including fillings,  crowns and bridges, complete and partial dentures, implants, gum treatment, root canals, and extractions. Preventive care is also provided. Treatment is provided to both adults and children. Patients are selected via a lottery and there is often a waiting list.   [facility] [address], [place]  [phone] [email or web address]   [facility] [address] [place], [place] [phone], Ext. [phone] Second and Fourth Thursday of each month, opens at 6:30 AM; Clinic ends at 9 AM.  Patients are seen on a first-come first-served basis, and a limited number are seen during each clinic.   [facility]  [address] Hillard Danker [place], [place] [phone]   Eligibility Requirements You must have lived in [place], [place], or [place] counties for at least the last three months.   You cannot be eligible for state or federal sponsored [employer], including [employer], [place], or [employer].   You generally cannot be eligible for healthcare insurance through your employer.    How to apply: Eligibility screenings are held every Tuesday and Wednesday afternoon from 1:00 pm until 4:00 pm. You do not need an appointment for the interview!  [facility] [address] [place], [place]   [place]  [place] Department  [place]  [phone]    Behavioral Health Resources in the Community: Intensive Outpatient Programs Organization         Address  Phone  Notes  [place]. [address] [place], [place] [phone]   [facility] Outpatient [address], [place], [place]   ADS: Alcohol & Drug Svcs [address], [place], [place]   [place] [address],  [place], [place] or [phone]   Substance Abuse  Resources Organization         Address  Phone  Notes  Alcohol and Drug Services  [phone]   [place]  [phone]   [facility] [place]   Chinita Pester  [phone]   Residential & Outpatient Substance Abuse Program  [phone]   Psychological Services Organization         Address  Phone  Notes  [facility]  [place]  [phone]   [place] [address], [place] or [phone]    Mobile Crisis Teams Organization         Address  Phone  Notes  [facility], Mobile Crisis Care Unit  [phone]   [facility]  [address] [place], [place]   [place] [address] [place] [phone]    Self-Help/Support Groups Organization         Address  Phone             Notes  [place]. of [place] - variety of support groups  [place] Call for more information  Narcotics Anonymous (NA), [facility] [address], [employer] [place]  2 meetings at this location   Special educational needs teacher         Address  Phone  Notes  [facility] Residential Treatment [place],    [place]  [phone]   [facility]  [address] [place] [phone], [place], [place]   [place], [place] [phone] Admissions: 8am-3pm M-F  Incentives Substance [place] [address],    [place], [place] [phone]   [facility] [address] Jadene Pierini [place], [place]   [facility] [address],  [place], [place]   [facility] Programs -  Intensive Outpatient [address] Laurell Joseph[address], [place], [place] [phone]   [facility] ([facility] [address] [place].,  [place], [place] [phone] or [phone]   [facility] (RTS) [address], [place], [place] [phone] Accepts Medicaid  [facility] [address]  [place] [phone] Substance Abuse/Addiction Treatment   [facility] Organization         Address  Phone  Notes  [facility]  [phone]   Angie Fava, PhD [address] Ervin Knack [place], [place]   [phone] or [phone]   [facility]al   [address] [place], [place] [phone]   [facility] [address] [place], [place] [phone] Insurance/Medicaid/sponsorship through [facility] and Families [address]                                    [place], [place] [phone] Therapy/tele-psych/case  [facility] [address], [place] [phone]    Dr. Lolly Mustache  [phone]   [facility] [place] [facility]  [employer] [facility] Dept. 1) [address], [place] 2) [address], [place] 3)  [address], [place] [phone]  [phone]   [facility] Child Abuse Hotline [phone] or [phone] (After Hours)

## 2014-05-23 NOTE — ED Provider Notes (Signed)
CSN: [id number]     Arrival date & time 05/23/14  1421 History   First MD Initiated Contact with Patient 05/23/14 1556     Chief Complaint  Patient presents with  . Emesis  . Abdominal Pain     (Consider location/radiation/quality/duration/timing/severity/associated sxs/prior Treatment) HPI The patient has a medical history complicated by fairly severe depression as reported by the patient and her husband. This has been a problem for several years. They described treatment with multiple medications including Prozac, Xanax and Klonopin. They report that the family physician has a referral to psychiatry for them. The husband and patient report that she does spend a lot of the day sleeping and not particularly active. The patient does not site any specific identifiable causes. She states this is been a waxing and waning problems since 2009 when she had a stroke. She however is not significantly debilitated in a motor sense from her stroke. She does have apparently problems with memory and recall. Coming to the hospital today, the chief complaint is vomiting. The patient throws up apparently about once a day. Her husband reports that always seems to be about the same time of day as well. This preceded her hospitalization for UTI for several weeks. He reports before that she seemed to have problems with persistent nausea. The symptoms have not changed since her hospitalization and treatment. The patient does not really endorse abdominal pain. She reports sometimes she has some generalized cramping and discomfort. She however does not describe this as any kind of prominent symptom, it is not present now. She denies any associated diarrhea or bowel dysfunction. Her husband describes a hospitalization about the same time last year at [facility] whereupon they did an extensive evaluation for very similar symptoms. The patient denies any headache, she denies gait incoordination or instability. Gen. fatigue  is a very prominent symptom as well. Her husband reports however in spite of this that she is taking Klonopin 3 times a day and in spite of daytime grogginess, always wants the evening dose as well. Her physician apparently treated her earlier with Xanax but has discontinued that at this time. Her husband reports that when she was discharged on Monday she was not given anything to take for nausea control. He reports he gave her some medications that were from a another family member's prescription for antiemetics. He reports it was helpful but does not know the name. Past Medical History  Diagnosis Date  . Diabetes mellitus without complication   . Hypertension   . Stroke     05/2008  . Hyperlipidemia   . Bladder infection     hx of  . GERD (gastroesophageal reflux disease)   . Neuromuscular disorder    Past Surgical History  Procedure Laterality Date  . Ankle closed reduction  04/15/2012    Procedure: CLOSED REDUCTION ANKLE;  Surgeon: Senaida LangeKevin M Supple, MD;  Location: [facility] OR;  Service: Orthopedics;  Laterality: Left;  . Carpal tunnel release      bilaterally  . Orif ankle fracture  05/09/2012    Procedure: OPEN REDUCTION INTERNAL FIXATION (ORIF) ANKLE FRACTURE;  Surgeon: Toni ArthursJohn Hewitt, MD;  Location: [facility] OR;  Service: Orthopedics;  Laterality: Left;  ORIF VERSES EXFIX LEFT ANKLE FRACTURE   Family History  Problem Relation Age of Onset  . CAD Father   . CAD Mother   . Cancer - Other Mother     Breast  . Cancer - Other Brother     Unknown type  History  Substance Use Topics  . Smoking status: Former Smoker    Types: Cigarettes    Quit date: 04/15/2012  . Smokeless tobacco: Not on file  . Alcohol Use: No   OB History    No data available     Review of Systems  10 Systems reviewed and are negative for acute change except as noted in the HPI.   Allergies  Codeine and Penicillins  Home Medications   Prior to Admission medications   Medication Sig Start Date End Date Taking?  Authorizing Provider  aspirin 81 MG tablet Take 81 mg by mouth daily.   Yes Historical Provider, MD  clonazePAM (KLONOPIN) 1 MG tablet Take 1 mg by mouth 3 (three) times daily.   Yes Historical Provider, MD  insulin glargine (LANTUS) 100 UNIT/ML injection Inject 25 Units into the skin at bedtime.    Yes Historical Provider, MD  lisinopril (PRINIVIL,ZESTRIL) 20 MG tablet Take 20 mg by mouth daily.   Yes Historical Provider, MD  metFORMIN (GLUCOPHAGE) 1000 MG tablet Take 1,000 mg by mouth 2 (two) times daily with a meal.   Yes Historical Provider, MD  omeprazole (PRILOSEC) 20 MG capsule Take 20 mg by mouth daily.   Yes Historical Provider, MD  simvastatin (ZOCOR) 40 MG tablet Take 40 mg by mouth every evening.   Yes Historical Provider, MD  ALPRAZolam (XANAX) 0.25 MG tablet Take 0.25 mg by mouth 2 (two) times daily.    Historical Provider, MD  aspirin EC 325 MG tablet Take 1 tablet (325 mg total) by mouth daily. Patient not taking: Reported on 05/23/2014 05/10/12   Toni ArthursJohn Hewitt, MD  calcium-vitamin D (OSCAL WITH D) 500-200 MG-UNIT per tablet Take 1 tablet by mouth 2 (two) times daily.    Historical Provider, MD  cholecalciferol (VITAMIN D) 1000 UNITS tablet Take 1,000 Units by mouth daily.    Historical Provider, MD  divalproex (DEPAKOTE ER) 500 MG 24 hr tablet Take 500 mg by mouth 2 (two) times daily.    Historical Provider, MD  escitalopram (LEXAPRO) 20 MG tablet Take 20 mg by mouth 2 (two) times daily.    Historical Provider, MD  folic acid (FOLVITE) 1 MG tablet Take 1 mg by mouth daily.    Historical Provider, MD  insulin aspart (NOVOLOG) 100 UNIT/ML injection Inject 2-15 Units into the skin 3 (three) times daily before meals. Per sliding scale  70-100 : 0 units, 101-150 :2 units, 151-200 :3 units, 201-250 : 6 units, 251-300: 9 units, 301-350: 12 units  Greater than 350  15 units    Historical Provider, MD  insulin aspart (NOVOLOG) 100 UNIT/ML injection Inject 2-5 Units into the skin at bedtime.  201-250: 2 units, 251-300 : 3 units, 301-350: 4 units,  351: 5 units  Call MD > 450    Historical Provider, MD  lisinopril-hydrochlorothiazide (PRINZIDE,ZESTORETIC) 20-12.5 MG per tablet Take 1 tablet by mouth daily.    Historical Provider, MD  Multiple Vitamin (MULTIVITAMIN WITH MINERALS) TABS Take 1 tablet by mouth daily.    Historical Provider, MD  ondansetron (ZOFRAN) 4 MG tablet Take 1 tablet (4 mg total) by mouth every 6 (six) hours as needed for nausea. Patient not taking: Reported on 05/23/2014 05/10/12   Toni ArthursJohn Hewitt, MD  oxyCODONE (OXY IR/ROXICODONE) 5 MG immediate release tablet Take 1-2 tablets (5-10 mg total) by mouth every 3 (three) hours as needed. Patient not taking: Reported on 05/23/2014 05/10/12   Toni ArthursJohn Hewitt, MD  polyethylene glycol [facility](MIRALAX / Ethelene HalGLYCOLAX)  packet Take 17 g by mouth daily.  04/17/12   Tracy Shuford, PA-C  vitamin B-12 (CYANOCOBALAMIN) 1000 MCG tablet Take 1,000 mcg by mouth daily.    Historical Provider, MD  vitamin C (ASCORBIC ACID) 500 MG tablet Take 500 mg by mouth daily. For wound healing for 28 days start 04-17-12 end 05-16-12    Historical Provider, MD  zinc gluconate 50 MG tablet Take 50 mg by mouth daily.    Historical Provider, MD   BP 140/70 mmHg  Pulse 93  Temp(Src) 97.5 F (36.4 C)  Resp 18  Ht 5\' 8"  (1.727 m)  Wt 160 lb (72.576 kg)  BMI 24.33 kg/m2  SpO2 95% Physical Exam  Constitutional: She is oriented to person, place, and time. She appears well-developed and well-nourished.  The patient is awake and alert. She does however appear fatigued and has a flat affect. Her speech is clear. No respiratory distress.  HENT:  Head: Normocephalic and atraumatic.  Nose: Nose normal.  Mouth/Throat: Oropharynx is clear and moist. No oropharyngeal exudate.  Eyes: EOM are normal. Pupils are equal, round, and reactive to light. Right eye exhibits no discharge. Left eye exhibits no discharge. No scleral icterus.  Neck: Neck supple.  Cardiovascular: Normal  rate, regular rhythm, normal heart sounds and intact distal pulses.   Pulmonary/Chest: Effort normal and breath sounds normal. No respiratory distress. She has no wheezes. She has no rales.  Abdominal: Soft. Bowel sounds are normal. She exhibits no distension and no mass. There is no tenderness. There is no rebound and no guarding.  Musculoskeletal: Normal range of motion. She exhibits no edema.  Neurological: She is alert and oriented to person, place, and time. She has normal strength. No cranial nerve deficit. She exhibits normal muscle tone. Coordination normal. GCS eye subscore is 4. GCS verbal subscore is 5. GCS motor subscore is 6.  Skin: Skin is warm, dry and intact.  Psychiatric:  The patient has moderately flat affect and poor eye contact.    ED Course  Procedures (including critical care time) Labs Review Labs Reviewed  CBC WITH DIFFERENTIAL - Abnormal; Notable for the following:    WBC 14.5 (*)    Neutrophils Relative % 81 (*)    Neutro Abs 11.7 (*)    All other components within normal limits  COMPREHENSIVE METABOLIC PANEL - Abnormal; Notable for the following:    Potassium 3.3 (*)    Glucose, Bld 368 (*)    All other components within normal limits  URINALYSIS, ROUTINE W REFLEX MICROSCOPIC - Abnormal; Notable for the following:    Specific Gravity, Urine 1.044 (*)    Glucose, UA >1000 (*)    Hgb urine dipstick TRACE (*)    Ketones, ur 15 (*)    All other components within normal limits  URINE MICROSCOPIC-ADD ON - Abnormal; Notable for the following:    Squamous Epithelial / LPF FEW (*)    All other components within normal limits  LIPASE, BLOOD    Imaging Review No results found.   EKG Interpretation None      MDM   Final diagnoses:  Non-intractable vomiting with nausea, vomiting of unspecified type  Depression  Fatigue due to depression   This point there do not appear to be any further diagnostic studies that will be helpful in identifying the  etiology of the patient's symptoms. Although the diagnostic studies are not available in our medical record, by the history present illness there is description of extensive prior workup and  referrals for similar symptomology. At this time there does not appear to be an intracranial etiology for nausea without any complaint of associated headache, no hypertension, and no gait instability or motor symptoms. It appears of low likelihood this is any type of surgical etiology. The patient has a soft and nontender abdomen and a very variable and not particularly painful description of abdominal pain. Zofran is on the medication list however they deny having any anti-emetics at this time. I will provide Zofran and have counseled the patient and her husband for signs and symptoms for which to return. They do have apparently very good follow-up and referrals for these ongoing symptoms.    Arby Barrette, MD 05/23/14 [phone]

## 2014-05-23 NOTE — ED Notes (Signed)
Pt. Stated, [facility] was [facility] for a UTI on Monday and she just finished taking the antibiotic this morning. No appetite and continues to vomit.

## 2014-05-25 DIAGNOSIS — Z6827 Body mass index (BMI) 27.0-27.9, adult: Secondary | ICD-10-CM | POA: Diagnosis not present

## 2014-05-25 DIAGNOSIS — R634 Abnormal weight loss: Secondary | ICD-10-CM | POA: Diagnosis not present

## 2014-05-28 DIAGNOSIS — R112 Nausea with vomiting, unspecified: Secondary | ICD-10-CM | POA: Diagnosis not present

## 2014-05-28 DIAGNOSIS — I2510 Atherosclerotic heart disease of native coronary artery without angina pectoris: Secondary | ICD-10-CM | POA: Diagnosis not present

## 2014-05-28 DIAGNOSIS — R634 Abnormal weight loss: Secondary | ICD-10-CM | POA: Diagnosis not present

## 2014-06-02 ENCOUNTER — Encounter (HOSPITAL_COMMUNITY): Payer: Self-pay | Admitting: Emergency Medicine

## 2014-06-02 DIAGNOSIS — K219 Gastro-esophageal reflux disease without esophagitis: Secondary | ICD-10-CM | POA: Insufficient documentation

## 2014-06-02 DIAGNOSIS — F329 Major depressive disorder, single episode, unspecified: Secondary | ICD-10-CM | POA: Insufficient documentation

## 2014-06-02 DIAGNOSIS — Z7982 Long term (current) use of aspirin: Secondary | ICD-10-CM | POA: Insufficient documentation

## 2014-06-02 DIAGNOSIS — F419 Anxiety disorder, unspecified: Secondary | ICD-10-CM | POA: Diagnosis not present

## 2014-06-02 DIAGNOSIS — Z87891 Personal history of nicotine dependence: Secondary | ICD-10-CM | POA: Diagnosis not present

## 2014-06-02 DIAGNOSIS — Z79899 Other long term (current) drug therapy: Secondary | ICD-10-CM | POA: Insufficient documentation

## 2014-06-02 DIAGNOSIS — Z8669 Personal history of other diseases of the nervous system and sense organs: Secondary | ICD-10-CM | POA: Insufficient documentation

## 2014-06-02 DIAGNOSIS — Z794 Long term (current) use of insulin: Secondary | ICD-10-CM | POA: Insufficient documentation

## 2014-06-02 DIAGNOSIS — Z880 Allergy status to penicillin: Secondary | ICD-10-CM | POA: Insufficient documentation

## 2014-06-02 DIAGNOSIS — R112 Nausea with vomiting, unspecified: Secondary | ICD-10-CM | POA: Diagnosis present

## 2014-06-02 DIAGNOSIS — I10 Essential (primary) hypertension: Secondary | ICD-10-CM | POA: Insufficient documentation

## 2014-06-02 DIAGNOSIS — E119 Type 2 diabetes mellitus without complications: Secondary | ICD-10-CM | POA: Insufficient documentation

## 2014-06-02 DIAGNOSIS — Z8742 Personal history of other diseases of the female genital tract: Secondary | ICD-10-CM | POA: Diagnosis not present

## 2014-06-02 DIAGNOSIS — Z8673 Personal history of transient ischemic attack (TIA), and cerebral infarction without residual deficits: Secondary | ICD-10-CM | POA: Diagnosis not present

## 2014-06-02 DIAGNOSIS — E785 Hyperlipidemia, unspecified: Secondary | ICD-10-CM | POA: Diagnosis not present

## 2014-06-02 DIAGNOSIS — Z6827 Body mass index (BMI) 27.0-27.9, adult: Secondary | ICD-10-CM | POA: Diagnosis not present

## 2014-06-02 DIAGNOSIS — F3132 Bipolar disorder, current episode depressed, moderate: Secondary | ICD-10-CM | POA: Diagnosis not present

## 2014-06-02 LAB — COMPREHENSIVE METABOLIC PANEL
ALT: 16 U/L (ref 0–35)
AST: 14 U/L (ref 0–37)
Albumin: 3.8 g/dL (ref 3.5–5.2)
Alkaline Phosphatase: 75 U/L (ref 39–117)
Anion gap: 13 (ref 5–15)
BUN: 7 mg/dL (ref 6–23)
CO2: 25 mmol/L (ref 19–32)
Calcium: 9.6 mg/dL (ref 8.4–10.5)
Chloride: 101 mEq/L (ref 96–112)
Creatinine, Ser: 0.92 mg/dL (ref 0.50–1.10)
GFR calc Af Amer: 77 mL/min — ABNORMAL LOW (ref >90)
GFR calc non Af Amer: 66 mL/min — ABNORMAL LOW (ref >90)
Glucose, Bld: 321 mg/dL — ABNORMAL HIGH (ref 70–99)
Potassium: 3 mmol/L — ABNORMAL LOW (ref 3.5–5.1)
Sodium: 139 mmol/L (ref 135–145)
Total Bilirubin: 0.8 mg/dL (ref 0.3–1.2)
Total Protein: 6.9 g/dL (ref 6.0–8.3)

## 2014-06-02 LAB — CBC WITH DIFFERENTIAL/PLATELET
Basophils Absolute: 0 10*3/uL (ref 0.0–0.1)
Basophils Relative: 0 % (ref 0–1)
Eosinophils Absolute: 0 10*3/uL (ref 0.0–0.7)
Eosinophils Relative: 0 % (ref 0–5)
HCT: 43.2 % (ref 36.0–46.0)
Hemoglobin: 15.4 g/dL — ABNORMAL HIGH (ref 12.0–15.0)
Lymphocytes Relative: 18 % (ref 12–46)
Lymphs Abs: 2.1 10*3/uL (ref 0.7–4.0)
MCH: 29.1 pg (ref 26.0–34.0)
MCHC: 35.6 g/dL (ref 30.0–36.0)
MCV: 81.5 fL (ref 78.0–100.0)
Monocytes Absolute: 1 10*3/uL (ref 0.1–1.0)
Monocytes Relative: 8 % (ref 3–12)
Neutro Abs: 8.7 10*3/uL — ABNORMAL HIGH (ref 1.7–7.7)
Neutrophils Relative %: 74 % (ref 43–77)
Platelets: 319 10*3/uL (ref 150–400)
RBC: 5.3 MIL/uL — ABNORMAL HIGH (ref 3.87–5.11)
RDW: 14 % (ref 11.5–15.5)
WBC: 11.8 10*3/uL — ABNORMAL HIGH (ref 4.0–10.5)

## 2014-06-02 NOTE — ED Notes (Signed)
Pt. reports persistent nausea , vomitting , poor appetite  and fatigue for 2 weeks , seen [facility] today by her PCP CT scan done with negative results . Denies fever or diarrhea.

## 2014-06-03 ENCOUNTER — Encounter (HOSPITAL_COMMUNITY): Payer: Self-pay | Admitting: *Deleted

## 2014-06-03 ENCOUNTER — Emergency Department (HOSPITAL_COMMUNITY)
Admission: EM | Admit: 2014-06-03 | Discharge: 2014-06-04 | Disposition: A | Discharge status: Other Acute Inpatient Hospital | Payer: BC Managed Care – PPO | Attending: Emergency Medicine | Admitting: Emergency Medicine

## 2014-06-03 DIAGNOSIS — Z8659 Personal history of other mental and behavioral disorders: Secondary | ICD-10-CM

## 2014-06-03 DIAGNOSIS — F329 Major depressive disorder, single episode, unspecified: Secondary | ICD-10-CM

## 2014-06-03 DIAGNOSIS — F32A Depression, unspecified: Secondary | ICD-10-CM

## 2014-06-03 DIAGNOSIS — R110 Nausea: Secondary | ICD-10-CM

## 2014-06-03 HISTORY — DX: Depression, unspecified: F32.A

## 2014-06-03 HISTORY — DX: Major depressive disorder, single episode, unspecified: F32.9

## 2014-06-03 HISTORY — DX: Anxiety disorder, unspecified: F41.9

## 2014-06-03 LAB — URINALYSIS, ROUTINE W REFLEX MICROSCOPIC
Glucose, UA: >1000 mg/dL — AB
Ketones, ur: >80 mg/dL — AB
Nitrite: NEGATIVE
Protein, ur: 30 mg/dL — AB
Specific Gravity, Urine: 1.042 — ABNORMAL HIGH (ref 1.005–1.030)
Urobilinogen, UA: 1 mg/dL (ref 0.0–1.0)
pH: 5.5 (ref 5.0–8.0)

## 2014-06-03 LAB — URINE MICROSCOPIC-ADD ON

## 2014-06-03 LAB — CBG MONITORING, ED
Glucose-Capillary: 258 mg/dL — ABNORMAL HIGH (ref 70–99)
Glucose-Capillary: 195 mg/dL — ABNORMAL HIGH (ref 70–99)
Glucose-Capillary: 285 mg/dL — ABNORMAL HIGH (ref 70–99)
Glucose-Capillary: 292 mg/dL — ABNORMAL HIGH (ref 70–99)
Glucose-Capillary: 239 mg/dL — ABNORMAL HIGH (ref 70–99)
Glucose-Capillary: 234 mg/dL — ABNORMAL HIGH (ref 70–99)
Glucose-Capillary: 298 mg/dL — ABNORMAL HIGH (ref 70–99)
Glucose-Capillary: 173 mg/dL — ABNORMAL HIGH (ref 70–99)
Glucose-Capillary: 232 mg/dL — ABNORMAL HIGH (ref 70–99)

## 2014-06-03 LAB — ETHANOL: Alcohol, Ethyl (B): <5 mg/dL (ref 0–9)

## 2014-06-03 MED ORDER — VITAMIN B-12 1000 MCG PO TABS
1000.0000 ug | ORAL_TABLET | Freq: Every day | ORAL | Status: DC
  Filled 2014-06-03 (×2): qty 1

## 2014-06-03 MED ORDER — ALPRAZOLAM 0.25 MG PO TABS
0.2500 mg | ORAL_TABLET | Freq: Three times a day (TID) | ORAL | Status: DC
  Administered 2014-06-03 (×3): 0.25 mg via ORAL
  Filled 2014-06-03 (×3): qty 1

## 2014-06-03 MED ORDER — ZINC SULFATE 220 (50 ZN) MG PO CAPS
220.0000 mg | ORAL_CAPSULE | Freq: Every day | ORAL | Status: DC
  Filled 2014-06-03: qty 1

## 2014-06-03 MED ORDER — LISINOPRIL 20 MG PO TABS: 20.0000 mg | ORAL_TABLET | Freq: Every day | ORAL | Status: DC

## 2014-06-03 MED ORDER — HYDROCHLOROTHIAZIDE 12.5 MG PO CAPS: 12.5000 mg | ORAL_CAPSULE | Freq: Every day | ORAL | Status: DC

## 2014-06-03 MED ORDER — INSULIN GLARGINE 100 UNIT/ML ~~LOC~~ SOLN
15.0000 [IU] | Freq: Every day | SUBCUTANEOUS | Status: DC
  Administered 2014-06-03: 15 [IU] via SUBCUTANEOUS
  Filled 2014-06-03 (×2): qty 0.15

## 2014-06-03 MED ORDER — SIMVASTATIN 40 MG PO TABS
40.0000 mg | ORAL_TABLET | Freq: Every evening | ORAL | Status: DC
  Administered 2014-06-03: 40 mg via ORAL
  Filled 2014-06-03 (×2): qty 1

## 2014-06-03 MED ORDER — INSULIN ASPART 100 UNIT/ML ~~LOC~~ SOLN
2.0000 [IU] | Freq: Three times a day (TID) | SUBCUTANEOUS | Status: DC
  Administered 2014-06-03: 8 [IU] via SUBCUTANEOUS
  Filled 2014-06-03 (×2): qty 1

## 2014-06-03 MED ORDER — LISINOPRIL 20 MG PO TABS
20.0000 mg | ORAL_TABLET | Freq: Every day | ORAL | Status: DC
  Administered 2014-06-03: 20 mg via ORAL
  Filled 2014-06-03: qty 1

## 2014-06-03 MED ORDER — ONDANSETRON 4 MG PO TBDP
8.0000 mg | ORAL_TABLET | Freq: Once | ORAL | Status: AC
  Administered 2014-06-03: 8 mg via ORAL
  Filled 2014-06-03: qty 2

## 2014-06-03 MED ORDER — POTASSIUM CHLORIDE CRYS ER 20 MEQ PO TBCR
40.0000 meq | EXTENDED_RELEASE_TABLET | Freq: Once | ORAL | Status: AC
  Administered 2014-06-03: 40 meq via ORAL
  Filled 2014-06-03: qty 2

## 2014-06-03 MED ORDER — FOLIC ACID 1 MG PO TABS
1.0000 mg | ORAL_TABLET | Freq: Every day | ORAL | Status: DC
  Filled 2014-06-03: qty 1

## 2014-06-03 MED ORDER — ONDANSETRON 4 MG PO TBDP
4.0000 mg | ORAL_TABLET | ORAL | Status: DC | PRN
  Administered 2014-06-03 – 2014-06-04 (×2): 4 mg via ORAL
  Filled 2014-06-03 (×2): qty 1

## 2014-06-03 MED ORDER — VITAMIN C 500 MG PO TABS
500.0000 mg | ORAL_TABLET | Freq: Every day | ORAL | Status: DC
  Filled 2014-06-03: qty 1

## 2014-06-03 MED ORDER — ESCITALOPRAM OXALATE 10 MG PO TABS
20.0000 mg | ORAL_TABLET | Freq: Two times a day (BID) | ORAL | Status: DC
  Administered 2014-06-03 (×2): 20 mg via ORAL
  Filled 2014-06-03 (×2): qty 2

## 2014-06-03 MED ORDER — PANTOPRAZOLE SODIUM 40 MG PO TBEC: 40.0000 mg | DELAYED_RELEASE_TABLET | Freq: Every day | ORAL | Status: DC

## 2014-06-03 MED ORDER — LISINOPRIL-HYDROCHLOROTHIAZIDE 20-12.5 MG PO TABS: 1.0000 | ORAL_TABLET | Freq: Every day | ORAL | Status: DC

## 2014-06-03 MED ORDER — INSULIN ASPART 100 UNIT/ML ~~LOC~~ SOLN
0.0000 [IU] | Freq: Every day | SUBCUTANEOUS | Status: DC
  Administered 2014-06-03: 2 [IU] via SUBCUTANEOUS
  Filled 2014-06-03: qty 1

## 2014-06-03 MED ORDER — VITAMIN D 1000 UNITS PO TABS: 1000.0000 [IU] | ORAL_TABLET | Freq: Every day | ORAL | Status: DC

## 2014-06-03 MED ORDER — INSULIN ASPART 100 UNIT/ML ~~LOC~~ SOLN
0.0000 [IU] | Freq: Three times a day (TID) | SUBCUTANEOUS | Status: DC
  Administered 2014-06-03: 8 [IU] via SUBCUTANEOUS
  Administered 2014-06-03: 3 [IU] via SUBCUTANEOUS
  Filled 2014-06-03 (×3): qty 1

## 2014-06-03 MED ORDER — DIVALPROEX SODIUM ER 500 MG PO TB24
500.0000 mg | ORAL_TABLET | Freq: Two times a day (BID) | ORAL | Status: DC
  Administered 2014-06-03 (×2): 500 mg via ORAL
  Filled 2014-06-03 (×4): qty 1

## 2014-06-03 NOTE — ED Provider Notes (Signed)
Pt with severe depression, not eating/drinking, excessive sleeping.  Concerned that patient is a danger to herself due to lack of self care.  IVC papers completed.    Chloe FossaElizabeth Audree Schrecengost, MD 06/03/14 [phone]

## 2014-06-03 NOTE — ED Notes (Signed)
Called [employer] and left message that we have received the pt IVC papers and to call and schedule a pickup when possible.

## 2014-06-03 NOTE — BH Assessment (Addendum)
Patient has been accepted to [facility].  The accepting doctor is Dr. Hoy MornMyton Catherine.  The number for the nurse to give report to is [phone].  Writer informed the nurse Wille Celeste(Janie) and she will arrange transportation for the patient.

## 2014-06-03 NOTE — ED Notes (Signed)
Encouraged pt to shower but she declined.

## 2014-06-03 NOTE — ED Notes (Signed)
Denied at [facility] due to lacking acuity (SI and HI).

## 2014-06-03 NOTE — ED Notes (Signed)
Patient wanted her blood sugar checked states she feels like her sugar is low. Blood sugar was checked and her CBG is. 195

## 2014-06-03 NOTE — ED Notes (Signed)
Contacted pharmacy about pt insulin [place] to be sent to unit

## 2014-06-03 NOTE — ED Notes (Signed)
Pt's son requesting to be called before pt is transported to [facility].

## 2014-06-03 NOTE — ED Notes (Signed)
Family states pt sent to ED by PCP to initiate inpatient behavorial treatment. Family states pt thinks she will vomit if she eats any food. Pt is unable to take meds because she wont eat.

## 2014-06-03 NOTE — BH Assessment (Signed)
Tele Assessment Note   Chloe Owens is a 58 y.o. female who voluntarily presents to [facility] at the request of her PCP with worsening depressive and anxiety sxs x6 wks.  Pt told this Probation officer that she has been struggling with depression for more than a year.  Pt states that she doesn't sleep well and has decreased appetite, resulting in 20pf wt loss.  Prior to arrival at [facility], pt met with PCP at [facility] and ct scan was performed with negative findings(pt had stroke in 2009).  Pt denies SI/HI/SA/AVH, stating that she's had no inpt admissions but has an upcoming appt with Dr. Clovis Pu on 06/09/14.  Pt says she doesn't feel that her psych medications are working and she is compliant with all prescribed medications.  Pt is accompanied by her son and spouse who are her caregivers, they state that pt has not been eating because she is afraid that she will get sick and vomit.  She's been c/o nausea, vomiting and fatigue for 2 wks and has diabetes which exacerbates her physical and mental condition even more.  Pt sleeps approx 18 hrs, daily and ADL's have decreased; not bathing for approx 10-12 days at times.  Pt.'s family state that pt has been taking excessive amounts of nausea pills because she feels sick. Pt had UTI 2wks ago because she is not eating or drinking.  This Probation officer talked with Nena Polio, PA, recommends inpt gero psych admission.     Axis I: Depressive Disorder secondary to general medical condition Axis II: Deferred Axis III:  Past Medical History  Diagnosis Date  . Diabetes mellitus without complication   . Hypertension   . Stroke     05/2008  . Hyperlipidemia   . Bladder infection     hx of  . GERD (gastroesophageal reflux disease)   . Neuromuscular disorder   . Depression   . Anxiety    Axis IV: other psychosocial or environmental problems and problems related to social environment Axis V: 41-50 serious symptoms  Past Medical History:  Past Medical History  Diagnosis Date   . Diabetes mellitus without complication   . Hypertension   . Stroke     05/2008  . Hyperlipidemia   . Bladder infection     hx of  . GERD (gastroesophageal reflux disease)   . Neuromuscular disorder   . Depression   . Anxiety     Past Surgical History  Procedure Laterality Date  . Ankle closed reduction  04/15/2012    Procedure: CLOSED REDUCTION ANKLE;  Surgeon: Marin Shutter, MD;  Location: [place];  Service: Orthopedics;  Laterality: Left;  . Carpal tunnel release      bilaterally  . Orif ankle fracture  05/09/2012    Procedure: OPEN REDUCTION INTERNAL FIXATION (ORIF) ANKLE FRACTURE;  Surgeon: Wylene Simmer, MD;  Location: [place];  Service: Orthopedics;  Laterality: Left;  ORIF VERSES EXFIX LEFT ANKLE FRACTURE    Family History:  Family History  Problem Relation Age of Onset  . CAD Father   . CAD Mother   . Cancer - Other Mother     Breast  . Cancer - Other Brother     Unknown type    Social History:  reports that she quit smoking about 2 years ago. Her smoking use included Cigarettes. She smoked 0.00 packs per day. She does not have any smokeless tobacco history on file. She reports that she does not drink alcohol or use illicit drugs.  Additional  Social History:  Alcohol / Drug Use Pain Medications: See MAR  Prescriptions: See MAR  Over the Counter: See MAR  History of alcohol / drug use?: No history of alcohol / drug abuse Longest period of sobriety (when/how long): None   CIWA: CIWA-Ar BP: 165/80 mmHg Pulse Rate: 82 COWS:    PATIENT STRENGTHS: (choose at least two) Supportive family/friends  Allergies:  Allergies  Allergen Reactions  . Codeine Nausea And Vomiting and Rash  . Penicillins Rash    Home Medications:  (Not in a hospital admission)  OB/GYN Status:  No LMP recorded. Patient is postmenopausal.  General Assessment Data Location of Assessment: [facility] ED Is this a Tele or Face-to-Face Assessment?: Tele Assessment Is this an Initial Assessment or  a Re-assessment for this encounter?: Initial Assessment Living Arrangements: Spouse/significant other Can pt return to current living arrangement?: Yes Admission Status: Voluntary Is patient capable of signing voluntary admission?: Yes Transfer from: Home Referral Source: Self/Family/Friend  Medical Screening Exam ([place]) Medical Exam completed: No Reason for MSE not completed: Other: (None )  [facility] Crisis Care Plan Living Arrangements: Spouse/significant other Name of Psychiatrist: Dr. Reggie Pile 06/08/14 Name of Therapist: None   Education Status Is patient currently in school?: No Current Grade: None  Highest grade of school patient has completed: None  Name of school: None  Contact person: None   Risk to self with the past 6 months Suicidal Ideation: No Suicidal Intent: No Is patient at risk for suicide?: No Suicidal Plan?: No Access to Means: No What has been your use of drugs/alcohol within the last 12 months?: Denies  Previous Attempts/Gestures: No How many times?: 0 Other Self Harm Risks: None  Triggers for Past Attempts: None known Intentional Self Injurious Behavior: None Family Suicide History: Yes (Brother with mental illness ) Recent stressful life event(s): Other (Comment) (Worsening depression and axiety ) Persecutory voices/beliefs?: No Depression: Yes Depression Symptoms: Isolating, Tearfulness, Fatigue, Loss of interest in usual pleasures, Feeling worthless/self pity, Despondent Substance abuse history and/or treatment for substance abuse?: No Suicide prevention information given to non-admitted patients: Not applicable  Risk to Others within the past 6 months Homicidal Ideation: No Thoughts of Harm to Others: No Current Homicidal Intent: No Current Homicidal Plan: No Access to Homicidal Means: No Identified Victim: None  History of harm to others?: No Assessment of Violence: None Noted Violent Behavior Description: None  Does patient  have access to weapons?: No Criminal Charges Pending?: No Does patient have a court date: No  Psychosis Hallucinations: None noted Delusions: None noted  Mental Status Report Appear/Hygiene: In hospital gown Eye Contact: Good Motor Activity: Unsteady (Due to stroke ) Speech: Logical/coherent, Slow Level of Consciousness: Alert, Quiet/awake Mood: Depressed Affect: Depressed Anxiety Level: None Thought Processes: Coherent, Relevant Judgement: Impaired Orientation: Person, Place, Time, Situation Obsessive Compulsive Thoughts/Behaviors: Moderate  Cognitive Functioning Concentration: Decreased Memory: Recent Impaired, Remote Impaired IQ: Average Insight: Poor Impulse Control: Fair Appetite: Poor Weight Loss: 20 Weight Gain: 0 Sleep: Increased Total Hours of Sleep: 18 Vegetative Symptoms: Staying in bed, Not bathing, Decreased grooming  ADLScreening [facility] Assessment Services) Patient's cognitive ability adequate to safely complete daily activities?: Yes Patient able to express need for assistance with ADLs?: Yes Independently performs ADLs?: Yes (appropriate for developmental age) (Pt had stroke in 2009; minimal assistance may be required )  Prior Inpatient Therapy Prior Inpatient Therapy: No Prior Therapy Dates: None  Prior Therapy Facilty/Provider(s): None  Reason for Treatment: None   Prior Outpatient Therapy Prior Outpatient  Therapy: Yes Prior Therapy Dates: Upcoming appt 06/09/14 Prior Therapy Facilty/Provider(s): Dr. Clovis Pu  Reason for Treatment: Med Mgt   ADL Screening (condition at time of admission) Patient's cognitive ability adequate to safely complete daily activities?: Yes Is the patient deaf or have difficulty hearing?: No Does the patient have difficulty seeing, even when wearing glasses/contacts?: No Does the patient have difficulty concentrating, remembering, or making decisions?: Yes Patient able to express need for assistance with ADLs?: Yes Does  the patient have difficulty dressing or bathing?: No Independently performs ADLs?: Yes (appropriate for developmental age) (Pt had stroke in 2009; minimal assistance may be required ) Does the patient have difficulty walking or climbing stairs?: No Weakness of Legs: None Weakness of Arms/Hands: None  Home Assistive Devices/Equipment Home Assistive Devices/Equipment: None  Therapy Consults (therapy consults require a physician order) PT Evaluation Needed: No OT Evalulation Needed: No SLP Evaluation Needed: No Abuse/Neglect Assessment (Assessment to be complete while patient is alone) Physical Abuse: Denies Verbal Abuse: Denies Sexual Abuse: Denies Exploitation of patient/patient's resources: Denies Self-Neglect: Denies Values / Beliefs Cultural Requests During Hospitalization: None Spiritual Requests During Hospitalization: None Consults Spiritual Care Consult Needed: No Social Work Consult Needed: No Regulatory affairs officer (For Healthcare) Does patient have an advance directive?: No Would patient like information on creating an advanced directive?: No - patient declined information    Additional Information 1:1 In Past 12 Months?: No CIRT Risk: No Elopement Risk: No Does patient have medical clearance?: Yes     Disposition:  Disposition Initial Assessment Completed for this Encounter: Yes Disposition of Patient: Inpatient treatment program, Referred to (Per Nena Polio, PA; recommend inpt admission ) Type of inpatient treatment program: Adult Patient referred to: Other (Comment) (Per Nena Polio, PA; recommend inpt admission )  Girtha Rm 06/03/2014 2:49 AM

## 2014-06-03 NOTE — ED Notes (Signed)
Called staffing for a sitter for pt since being IVC'd.

## 2014-06-03 NOTE — ED Provider Notes (Signed)
CSN: [id number]     Arrival date & time 06/02/14  1943 History   This chart was scribe for Geoffery Lyonsouglas Avonda Toso, MD by Angelene GiovanniEmmanuella Mensah, ED Scribe. The patient was seen in room B15C/B15C and the patient's care was started at 12:39 AM.      Chief Complaint  Patient presents with  . Emesis   The history is provided by the patient, the spouse and a relative. No language interpreter was used.   HPI Comments: Chloe Barerry J Owens is a 58 y.o. female with a hx of bipolar disorder and a stroke in 2009 who presents to the Emergency Department complaining of nausea and vomiting but her family reports that she has not eaten today and has just been taking nausea medication. They report increased depression and anxiety in the past month and states that in the past 2 weeks, she has not been smoking nor performing any personal hygiene. Her husband adds that she has lost over 20 pounds since August. They state that she had a UTI 2 weeks ago which was treated in antibiotics and [facility] for a CT scan and had negative results. They report that she has been compliant with her medication. They deny any hx of GI issues. Her husband states that her medication was changed but then changed back to her original medication. He reports that she has not been on Depakote for a while. She has an appointment on 06/08/13 here. Her [facility]  Past Medical History  Diagnosis Date  . Diabetes mellitus without complication   . Hypertension   . Stroke     05/2008  . Hyperlipidemia   . Bladder infection     hx of  . GERD (gastroesophageal reflux disease)   . Neuromuscular disorder    Past Surgical History  Procedure Laterality Date  . Ankle closed reduction  04/15/2012    Procedure: CLOSED REDUCTION ANKLE;  Surgeon: Senaida LangeKevin M Supple, MD;  Location: [facility] OR;  Service: Orthopedics;  Laterality: Left;  . Carpal tunnel release      bilaterally  . Orif ankle fracture   05/09/2012    Procedure: OPEN REDUCTION INTERNAL FIXATION (ORIF) ANKLE FRACTURE;  Surgeon: Toni ArthursJohn Hewitt, MD;  Location: [facility] OR;  Service: Orthopedics;  Laterality: Left;  ORIF VERSES EXFIX LEFT ANKLE FRACTURE   Family History  Problem Relation Age of Onset  . CAD Father   . CAD Mother   . Cancer - Other Mother     Breast  . Cancer - Other Brother     Unknown type   History  Substance Use Topics  . Smoking status: Former Smoker    Types: Cigarettes    Quit date: 04/15/2012  . Smokeless tobacco: Not on file  . Alcohol Use: No   OB History    No data available     Review of Systems  Constitutional: Negative for fever.  Gastrointestinal: Positive for nausea and vomiting.  All other systems reviewed and are negative.     Allergies  Codeine and Penicillins  Home Medications   Prior to Admission medications   Medication Sig Start Date End Date Taking? Authorizing Provider  ALPRAZolam (XANAX) 0.25 MG tablet Take 0.25 mg by mouth 2 (two) times daily.    Historical Provider, MD  aspirin 81 MG tablet Take 81 mg by mouth daily.    Historical Provider, MD  aspirin EC 325 MG tablet Take 1 tablet (325 mg total) by  mouth daily. Patient not taking: Reported on 05/23/2014 05/10/12   Toni Arthurs, MD  calcium-vitamin D (OSCAL WITH D) 500-200 MG-UNIT per tablet Take 1 tablet by mouth 2 (two) times daily.    Historical Provider, MD  cholecalciferol (VITAMIN D) 1000 UNITS tablet Take 1,000 Units by mouth daily.    Historical Provider, MD  clonazePAM (KLONOPIN) 1 MG tablet Take 1 mg by mouth 3 (three) times daily.    Historical Provider, MD  divalproex (DEPAKOTE ER) 500 MG 24 hr tablet Take 500 mg by mouth 2 (two) times daily.    Historical Provider, MD  escitalopram (LEXAPRO) 20 MG tablet Take 20 mg by mouth 2 (two) times daily.    Historical Provider, MD  folic acid (FOLVITE) 1 MG tablet Take 1 mg by mouth daily.    Historical Provider, MD  insulin aspart (NOVOLOG) 100 UNIT/ML injection Inject  2-15 Units into the skin 3 (three) times daily before meals. Per sliding scale  70-100 : 0 units, 101-150 :2 units, 151-200 :3 units, 201-250 : 6 units, 251-300: 9 units, 301-350: 12 units  Greater than 350  15 units    Historical Provider, MD  insulin aspart (NOVOLOG) 100 UNIT/ML injection Inject 2-5 Units into the skin at bedtime. 201-250: 2 units, 251-300 : 3 units, 301-350: 4 units,  351: 5 units  Call MD > 450    Historical Provider, MD  insulin glargine (LANTUS) 100 UNIT/ML injection Inject 25 Units into the skin at bedtime.     Historical Provider, MD  lisinopril (PRINIVIL,ZESTRIL) 20 MG tablet Take 20 mg by mouth daily.    Historical Provider, MD  lisinopril-hydrochlorothiazide (PRINZIDE,ZESTORETIC) 20-12.5 MG per tablet Take 1 tablet by mouth daily.    Historical Provider, MD  metFORMIN (GLUCOPHAGE) 1000 MG tablet Take 1,000 mg by mouth 2 (two) times daily with a meal.    Historical Provider, MD  Multiple Vitamin (MULTIVITAMIN WITH MINERALS) TABS Take 1 tablet by mouth daily.    Historical Provider, MD  omeprazole (PRILOSEC) 20 MG capsule Take 20 mg by mouth daily.    Historical Provider, MD  ondansetron (ZOFRAN ODT) 4 MG disintegrating tablet Take 1 tablet (4 mg total) by mouth every 4 (four) hours as needed for nausea or vomiting. 05/23/14   Arby Barrette, MD  ondansetron (ZOFRAN) 4 MG tablet Take 1 tablet (4 mg total) by mouth every 6 (six) hours as needed for nausea. Patient not taking: Reported on 05/23/2014 05/10/12   Toni Arthurs, MD  oxyCODONE (OXY IR/ROXICODONE) 5 MG immediate release tablet Take 1-2 tablets (5-10 mg total) by mouth every 3 (three) hours as needed. Patient not taking: Reported on 05/23/2014 05/10/12   Toni Arthurs, MD  polyethylene glycol [facility] / Ethelene Hal) packet Take 17 g by mouth daily.  04/17/12   Tracy Shuford, PA-C  simvastatin (ZOCOR) 40 MG tablet Take 40 mg by mouth every evening.    Historical Provider, MD  vitamin B-12 (CYANOCOBALAMIN) 1000 MCG tablet Take  1,000 mcg by mouth daily.    Historical Provider, MD  vitamin C (ASCORBIC ACID) 500 MG tablet Take 500 mg by mouth daily. For wound healing for 28 days start 04-17-12 end 05-16-12    Historical Provider, MD  zinc gluconate 50 MG tablet Take 50 mg by mouth daily.    Historical Provider, MD   BP 159/79 mmHg  Pulse 83  Temp(Src) 98.2 F (36.8 C) (Oral)  Resp 16  SpO2 96% Physical Exam  Constitutional: She is oriented to person, place, and  time. She appears well-developed and well-nourished. No distress.  HENT:  Head: Normocephalic and atraumatic.  Eyes: Conjunctivae and EOM are normal.  Neck: Neck supple. No tracheal deviation present.  Cardiovascular: Normal rate.   Pulmonary/Chest: Effort normal. No respiratory distress.  Musculoskeletal: Normal range of motion.  Neurological: She is alert and oriented to person, place, and time.  Skin: Skin is warm and dry.  Psychiatric: Judgment and thought content normal. Her speech is delayed. She is withdrawn. Cognition and memory are normal. She exhibits a depressed mood.  Nursing note and vitals reviewed.   ED Course  Procedures (including critical care time) DIAGNOSTIC STUDIES: Oxygen Saturation is 96% on RA, low by my interpretation.    COORDINATION OF CARE: 12:53 AM- Pt advised of plan for treatment and pt agrees.    Labs Review Labs Reviewed  CBC WITH DIFFERENTIAL - Abnormal; Notable for the following:    WBC 11.8 (*)    RBC 5.30 (*)    Hemoglobin 15.4 (*)    Neutro Abs 8.7 (*)    All other components within normal limits  COMPREHENSIVE METABOLIC PANEL - Abnormal; Notable for the following:    Potassium 3.0 (*)    Glucose, Bld 321 (*)    GFR calc non Af Amer 66 (*)    GFR calc Af Amer 77 (*)    All other components within normal limits    Imaging Review No results found.   EKG Interpretation None      MDM   Final diagnoses:  None    Patient evaluated by TTS. They will pursue inpatient psychiatric  placement.  I personally performed the services described in this documentation, which was scribed in my presence. The recorded information has been reviewed and is accurate.    Geoffery Lyonsouglas Ved Martos, MD 06/03/14 [phone]

## 2014-06-03 NOTE — ED Notes (Signed)
CBG 292 

## 2014-06-03 NOTE — ED Notes (Signed)
Chloe Owens

## 2014-06-03 NOTE — ED Notes (Addendum)
Pt family at bedside.  Family agreed to IVC pt in order for her to be accepted [facility].  Made MD aware are IVC paperwork and called and left a message for [employer], sheriff transportation and asked him to call back with information on [facility]

## 2014-06-03 NOTE — ED Notes (Signed)
Telepsych monitor placed at bedside.  

## 2014-06-03 NOTE — Progress Notes (Addendum)
Pt has been assessed and meets inpatient criteria for psychiatric placement. Pt clinical[facility]  [facility]- at [facility]   Derrell Lollingoris Rylin Saez, MSW  Social Worker [phone]

## 2014-06-03 NOTE — ED Notes (Addendum)
[facility] [phone], called with possible placement but patient would have to be issued IVC papers.  Patients family will be back around 6pm to help with meal and encourage patient to eat.  Patients family to advise on if this placement is something they want to consider.

## 2014-06-03 NOTE — ED Notes (Signed)
Sent IVC to Magistrate's; confirmed receipt @ 1840

## 2014-06-03 NOTE — ED Notes (Signed)
[facility] once a bed is found.

## 2014-06-03 NOTE — ED Notes (Addendum)
Spoke with Leotis ShamesLauren (working with Tyler Aasoris), patient is waiting on inpatient placement with a geriatric facility.  Patients fam

## 2014-06-04 DIAGNOSIS — I672 Cerebral atherosclerosis: Secondary | ICD-10-CM | POA: Diagnosis not present

## 2014-06-04 DIAGNOSIS — R112 Nausea with vomiting, unspecified: Secondary | ICD-10-CM | POA: Diagnosis not present

## 2014-06-04 DIAGNOSIS — Z87891 Personal history of nicotine dependence: Secondary | ICD-10-CM | POA: Diagnosis not present

## 2014-06-04 DIAGNOSIS — F0151 Vascular dementia with behavioral disturbance: Secondary | ICD-10-CM | POA: Diagnosis not present

## 2014-06-04 DIAGNOSIS — I10 Essential (primary) hypertension: Secondary | ICD-10-CM | POA: Diagnosis not present

## 2014-06-04 DIAGNOSIS — E785 Hyperlipidemia, unspecified: Secondary | ICD-10-CM | POA: Diagnosis not present

## 2014-06-04 DIAGNOSIS — K219 Gastro-esophageal reflux disease without esophagitis: Secondary | ICD-10-CM | POA: Diagnosis not present

## 2014-06-04 DIAGNOSIS — G709 Myoneural disorder, unspecified: Secondary | ICD-10-CM | POA: Diagnosis not present

## 2014-06-04 DIAGNOSIS — F329 Major depressive disorder, single episode, unspecified: Secondary | ICD-10-CM | POA: Diagnosis not present

## 2014-06-04 DIAGNOSIS — F319 Bipolar disorder, unspecified: Secondary | ICD-10-CM | POA: Diagnosis not present

## 2014-06-04 DIAGNOSIS — E119 Type 2 diabetes mellitus without complications: Secondary | ICD-10-CM | POA: Diagnosis not present

## 2014-06-04 DIAGNOSIS — G252 Other specified forms of tremor: Secondary | ICD-10-CM | POA: Diagnosis not present

## 2014-06-04 LAB — CBG MONITORING, ED: Glucose-Capillary: 151 mg/dL — ABNORMAL HIGH (ref 70–99)

## 2014-06-04 NOTE — Progress Notes (Signed)
[facility], CSW confirmed Pt admission. Staff reported that Sheriff's office had confirmed admission as well and that staff were expecting Pt later in the morning.   Chad CordialLauren Carter, LCSWA 06/04/2014 8:55 AM

## 2014-06-04 NOTE — ED Notes (Signed)
Pt refusing to eat breakfast, states she is nauseated, requesting juice. Pt given apple juice to drink, but soon after vomited. Will give zofran to help with vomiting/nausea.

## 2014-07-08 DIAGNOSIS — F3132 Bipolar disorder, current episode depressed, moderate: Secondary | ICD-10-CM | POA: Diagnosis not present

## 2014-07-08 DIAGNOSIS — E1122 Type 2 diabetes mellitus with diabetic chronic kidney disease: Secondary | ICD-10-CM | POA: Diagnosis not present

## 2014-07-08 DIAGNOSIS — F17210 Nicotine dependence, cigarettes, uncomplicated: Secondary | ICD-10-CM | POA: Diagnosis not present

## 2014-07-08 DIAGNOSIS — I10 Essential (primary) hypertension: Secondary | ICD-10-CM | POA: Diagnosis not present

## 2014-07-08 DIAGNOSIS — Z6830 Body mass index (BMI) 30.0-30.9, adult: Secondary | ICD-10-CM | POA: Diagnosis not present

## 2014-07-08 DIAGNOSIS — N183 Chronic kidney disease, stage 3 (moderate): Secondary | ICD-10-CM | POA: Diagnosis not present

## 2014-07-08 DIAGNOSIS — E782 Mixed hyperlipidemia: Secondary | ICD-10-CM | POA: Diagnosis not present

## 2014-07-23 DIAGNOSIS — R300 Dysuria: Secondary | ICD-10-CM | POA: Diagnosis not present

## 2014-08-12 DIAGNOSIS — F3132 Bipolar disorder, current episode depressed, moderate: Secondary | ICD-10-CM | POA: Diagnosis not present

## 2014-08-12 DIAGNOSIS — N183 Chronic kidney disease, stage 3 (moderate): Secondary | ICD-10-CM | POA: Diagnosis not present

## 2014-08-12 DIAGNOSIS — I10 Essential (primary) hypertension: Secondary | ICD-10-CM | POA: Diagnosis not present

## 2014-08-12 DIAGNOSIS — E1165 Type 2 diabetes mellitus with hyperglycemia: Secondary | ICD-10-CM | POA: Diagnosis not present

## 2014-08-19 DIAGNOSIS — J019 Acute sinusitis, unspecified: Secondary | ICD-10-CM | POA: Diagnosis not present

## 2014-08-19 DIAGNOSIS — R05 Cough: Secondary | ICD-10-CM | POA: Diagnosis not present

## 2014-08-21 DIAGNOSIS — E1122 Type 2 diabetes mellitus with diabetic chronic kidney disease: Secondary | ICD-10-CM | POA: Diagnosis not present

## 2014-08-21 DIAGNOSIS — N183 Chronic kidney disease, stage 3 (moderate): Secondary | ICD-10-CM | POA: Diagnosis not present

## 2014-08-21 DIAGNOSIS — E782 Mixed hyperlipidemia: Secondary | ICD-10-CM | POA: Diagnosis not present

## 2014-08-21 DIAGNOSIS — J209 Acute bronchitis, unspecified: Secondary | ICD-10-CM | POA: Diagnosis not present

## 2014-08-21 DIAGNOSIS — Z1389 Encounter for screening for other disorder: Secondary | ICD-10-CM | POA: Diagnosis not present

## 2014-08-21 DIAGNOSIS — F17210 Nicotine dependence, cigarettes, uncomplicated: Secondary | ICD-10-CM | POA: Diagnosis not present

## 2014-08-21 DIAGNOSIS — I10 Essential (primary) hypertension: Secondary | ICD-10-CM | POA: Diagnosis not present

## 2014-08-21 DIAGNOSIS — F331 Major depressive disorder, recurrent, moderate: Secondary | ICD-10-CM | POA: Diagnosis not present

## 2014-08-21 DIAGNOSIS — J301 Allergic rhinitis due to pollen: Secondary | ICD-10-CM | POA: Diagnosis not present

## 2014-08-21 DIAGNOSIS — F3132 Bipolar disorder, current episode depressed, moderate: Secondary | ICD-10-CM | POA: Diagnosis not present

## 2014-12-22 DIAGNOSIS — E782 Mixed hyperlipidemia: Secondary | ICD-10-CM | POA: Diagnosis not present

## 2014-12-22 DIAGNOSIS — I10 Essential (primary) hypertension: Secondary | ICD-10-CM | POA: Diagnosis not present

## 2014-12-22 DIAGNOSIS — F17210 Nicotine dependence, cigarettes, uncomplicated: Secondary | ICD-10-CM | POA: Diagnosis not present

## 2014-12-22 DIAGNOSIS — E1122 Type 2 diabetes mellitus with diabetic chronic kidney disease: Secondary | ICD-10-CM | POA: Diagnosis not present

## 2014-12-22 DIAGNOSIS — F3132 Bipolar disorder, current episode depressed, moderate: Secondary | ICD-10-CM | POA: Diagnosis not present

## 2014-12-29 DIAGNOSIS — E1122 Type 2 diabetes mellitus with diabetic chronic kidney disease: Secondary | ICD-10-CM | POA: Diagnosis not present

## 2014-12-29 DIAGNOSIS — N183 Chronic kidney disease, stage 3 (moderate): Secondary | ICD-10-CM | POA: Diagnosis not present

## 2014-12-29 DIAGNOSIS — F3132 Bipolar disorder, current episode depressed, moderate: Secondary | ICD-10-CM | POA: Diagnosis not present

## 2014-12-29 DIAGNOSIS — J301 Allergic rhinitis due to pollen: Secondary | ICD-10-CM | POA: Diagnosis not present

## 2014-12-29 DIAGNOSIS — Z6833 Body mass index (BMI) 33.0-33.9, adult: Secondary | ICD-10-CM | POA: Diagnosis not present

## 2014-12-29 DIAGNOSIS — E782 Mixed hyperlipidemia: Secondary | ICD-10-CM | POA: Diagnosis not present

## 2014-12-29 DIAGNOSIS — F17210 Nicotine dependence, cigarettes, uncomplicated: Secondary | ICD-10-CM | POA: Diagnosis not present

## 2014-12-29 DIAGNOSIS — I10 Essential (primary) hypertension: Secondary | ICD-10-CM | POA: Diagnosis not present

## 2014-12-29 DIAGNOSIS — F331 Major depressive disorder, recurrent, moderate: Secondary | ICD-10-CM | POA: Diagnosis not present

## 2015-03-29 DIAGNOSIS — N390 Urinary tract infection, site not specified: Secondary | ICD-10-CM | POA: Diagnosis not present

## 2015-03-29 DIAGNOSIS — Z6830 Body mass index (BMI) 30.0-30.9, adult: Secondary | ICD-10-CM | POA: Diagnosis not present

## 2015-04-16 DIAGNOSIS — Z6830 Body mass index (BMI) 30.0-30.9, adult: Secondary | ICD-10-CM | POA: Diagnosis not present

## 2015-04-16 DIAGNOSIS — N390 Urinary tract infection, site not specified: Secondary | ICD-10-CM | POA: Diagnosis not present

## 2015-04-27 DIAGNOSIS — I10 Essential (primary) hypertension: Secondary | ICD-10-CM | POA: Diagnosis not present

## 2015-04-27 DIAGNOSIS — E1122 Type 2 diabetes mellitus with diabetic chronic kidney disease: Secondary | ICD-10-CM | POA: Diagnosis not present

## 2015-04-27 DIAGNOSIS — N183 Chronic kidney disease, stage 3 (moderate): Secondary | ICD-10-CM | POA: Diagnosis not present

## 2015-04-27 DIAGNOSIS — F3132 Bipolar disorder, current episode depressed, moderate: Secondary | ICD-10-CM | POA: Diagnosis not present

## 2015-04-27 DIAGNOSIS — E782 Mixed hyperlipidemia: Secondary | ICD-10-CM | POA: Diagnosis not present

## 2015-05-03 DIAGNOSIS — E782 Mixed hyperlipidemia: Secondary | ICD-10-CM | POA: Diagnosis not present

## 2015-05-03 DIAGNOSIS — Z6830 Body mass index (BMI) 30.0-30.9, adult: Secondary | ICD-10-CM | POA: Diagnosis not present

## 2015-05-03 DIAGNOSIS — J301 Allergic rhinitis due to pollen: Secondary | ICD-10-CM | POA: Diagnosis not present

## 2015-05-03 DIAGNOSIS — I10 Essential (primary) hypertension: Secondary | ICD-10-CM | POA: Diagnosis not present

## 2015-05-03 DIAGNOSIS — F3132 Bipolar disorder, current episode depressed, moderate: Secondary | ICD-10-CM | POA: Diagnosis not present

## 2015-05-03 DIAGNOSIS — E1122 Type 2 diabetes mellitus with diabetic chronic kidney disease: Secondary | ICD-10-CM | POA: Diagnosis not present

## 2015-05-03 DIAGNOSIS — N183 Chronic kidney disease, stage 3 (moderate): Secondary | ICD-10-CM | POA: Diagnosis not present

## 2015-05-03 DIAGNOSIS — F17210 Nicotine dependence, cigarettes, uncomplicated: Secondary | ICD-10-CM | POA: Diagnosis not present

## 2015-06-14 DIAGNOSIS — J301 Allergic rhinitis due to pollen: Secondary | ICD-10-CM | POA: Diagnosis not present

## 2015-06-14 DIAGNOSIS — E1122 Type 2 diabetes mellitus with diabetic chronic kidney disease: Secondary | ICD-10-CM | POA: Diagnosis not present

## 2015-06-14 DIAGNOSIS — H6122 Impacted cerumen, left ear: Secondary | ICD-10-CM | POA: Diagnosis not present

## 2015-06-14 DIAGNOSIS — Z6829 Body mass index (BMI) 29.0-29.9, adult: Secondary | ICD-10-CM | POA: Diagnosis not present

## 2015-06-14 DIAGNOSIS — J019 Acute sinusitis, unspecified: Secondary | ICD-10-CM | POA: Diagnosis not present

## 2015-06-14 DIAGNOSIS — F17210 Nicotine dependence, cigarettes, uncomplicated: Secondary | ICD-10-CM | POA: Diagnosis not present

## 2015-07-28 DIAGNOSIS — F3132 Bipolar disorder, current episode depressed, moderate: Secondary | ICD-10-CM | POA: Diagnosis not present

## 2015-07-28 DIAGNOSIS — R110 Nausea: Secondary | ICD-10-CM | POA: Diagnosis not present

## 2015-07-28 DIAGNOSIS — Z6827 Body mass index (BMI) 27.0-27.9, adult: Secondary | ICD-10-CM | POA: Diagnosis not present

## 2015-08-20 DIAGNOSIS — E1122 Type 2 diabetes mellitus with diabetic chronic kidney disease: Secondary | ICD-10-CM | POA: Diagnosis not present

## 2015-08-20 DIAGNOSIS — R300 Dysuria: Secondary | ICD-10-CM | POA: Diagnosis not present

## 2015-08-20 DIAGNOSIS — E782 Mixed hyperlipidemia: Secondary | ICD-10-CM | POA: Diagnosis not present

## 2015-08-20 DIAGNOSIS — Z6828 Body mass index (BMI) 28.0-28.9, adult: Secondary | ICD-10-CM | POA: Diagnosis not present

## 2015-08-20 DIAGNOSIS — F17210 Nicotine dependence, cigarettes, uncomplicated: Secondary | ICD-10-CM | POA: Diagnosis not present

## 2015-08-20 DIAGNOSIS — I10 Essential (primary) hypertension: Secondary | ICD-10-CM | POA: Diagnosis not present

## 2015-08-20 DIAGNOSIS — R8299 Other abnormal findings in urine: Secondary | ICD-10-CM | POA: Diagnosis not present

## 2015-08-20 DIAGNOSIS — J301 Allergic rhinitis due to pollen: Secondary | ICD-10-CM | POA: Diagnosis not present

## 2015-08-20 DIAGNOSIS — N183 Chronic kidney disease, stage 3 (moderate): Secondary | ICD-10-CM | POA: Diagnosis not present

## 2015-10-21 DIAGNOSIS — N183 Chronic kidney disease, stage 3 (moderate): Secondary | ICD-10-CM | POA: Diagnosis not present

## 2015-10-21 DIAGNOSIS — F17210 Nicotine dependence, cigarettes, uncomplicated: Secondary | ICD-10-CM | POA: Diagnosis not present

## 2015-10-21 DIAGNOSIS — J301 Allergic rhinitis due to pollen: Secondary | ICD-10-CM | POA: Diagnosis not present

## 2015-10-21 DIAGNOSIS — Z6828 Body mass index (BMI) 28.0-28.9, adult: Secondary | ICD-10-CM | POA: Diagnosis not present

## 2015-10-21 DIAGNOSIS — I10 Essential (primary) hypertension: Secondary | ICD-10-CM | POA: Diagnosis not present

## 2015-10-21 DIAGNOSIS — E1122 Type 2 diabetes mellitus with diabetic chronic kidney disease: Secondary | ICD-10-CM | POA: Diagnosis not present

## 2015-10-21 DIAGNOSIS — Z1389 Encounter for screening for other disorder: Secondary | ICD-10-CM | POA: Diagnosis not present

## 2015-10-21 DIAGNOSIS — E782 Mixed hyperlipidemia: Secondary | ICD-10-CM | POA: Diagnosis not present

## 2016-01-01 DIAGNOSIS — Z6828 Body mass index (BMI) 28.0-28.9, adult: Secondary | ICD-10-CM | POA: Diagnosis not present

## 2016-01-01 DIAGNOSIS — R350 Frequency of micturition: Secondary | ICD-10-CM | POA: Diagnosis not present

## 2016-01-26 DIAGNOSIS — N183 Chronic kidney disease, stage 3 (moderate): Secondary | ICD-10-CM | POA: Diagnosis not present

## 2016-01-26 DIAGNOSIS — E782 Mixed hyperlipidemia: Secondary | ICD-10-CM | POA: Diagnosis not present

## 2016-01-26 DIAGNOSIS — E1122 Type 2 diabetes mellitus with diabetic chronic kidney disease: Secondary | ICD-10-CM | POA: Diagnosis not present

## 2016-01-26 DIAGNOSIS — E1165 Type 2 diabetes mellitus with hyperglycemia: Secondary | ICD-10-CM | POA: Diagnosis not present

## 2016-01-26 DIAGNOSIS — I10 Essential (primary) hypertension: Secondary | ICD-10-CM | POA: Diagnosis not present

## 2016-01-29 DIAGNOSIS — E161 Other hypoglycemia: Secondary | ICD-10-CM | POA: Diagnosis not present

## 2016-02-01 DIAGNOSIS — I10 Essential (primary) hypertension: Secondary | ICD-10-CM | POA: Diagnosis not present

## 2016-02-01 DIAGNOSIS — E1122 Type 2 diabetes mellitus with diabetic chronic kidney disease: Secondary | ICD-10-CM | POA: Diagnosis not present

## 2016-02-01 DIAGNOSIS — J301 Allergic rhinitis due to pollen: Secondary | ICD-10-CM | POA: Diagnosis not present

## 2016-02-01 DIAGNOSIS — Z9189 Other specified personal risk factors, not elsewhere classified: Secondary | ICD-10-CM | POA: Diagnosis not present

## 2016-02-01 DIAGNOSIS — N183 Chronic kidney disease, stage 3 (moderate): Secondary | ICD-10-CM | POA: Diagnosis not present

## 2016-02-01 DIAGNOSIS — F17210 Nicotine dependence, cigarettes, uncomplicated: Secondary | ICD-10-CM | POA: Diagnosis not present

## 2016-02-01 DIAGNOSIS — E782 Mixed hyperlipidemia: Secondary | ICD-10-CM | POA: Diagnosis not present

## 2016-02-01 DIAGNOSIS — Z6829 Body mass index (BMI) 29.0-29.9, adult: Secondary | ICD-10-CM | POA: Diagnosis not present

## 2016-05-05 DIAGNOSIS — E782 Mixed hyperlipidemia: Secondary | ICD-10-CM | POA: Diagnosis not present

## 2016-05-05 DIAGNOSIS — Z6829 Body mass index (BMI) 29.0-29.9, adult: Secondary | ICD-10-CM | POA: Diagnosis not present

## 2016-05-05 DIAGNOSIS — Z9189 Other specified personal risk factors, not elsewhere classified: Secondary | ICD-10-CM | POA: Diagnosis not present

## 2016-05-05 DIAGNOSIS — I10 Essential (primary) hypertension: Secondary | ICD-10-CM | POA: Diagnosis not present

## 2016-05-05 DIAGNOSIS — J301 Allergic rhinitis due to pollen: Secondary | ICD-10-CM | POA: Diagnosis not present

## 2016-05-05 DIAGNOSIS — Z23 Encounter for immunization: Secondary | ICD-10-CM | POA: Diagnosis not present

## 2016-05-05 DIAGNOSIS — N183 Chronic kidney disease, stage 3 (moderate): Secondary | ICD-10-CM | POA: Diagnosis not present

## 2016-05-05 DIAGNOSIS — E1122 Type 2 diabetes mellitus with diabetic chronic kidney disease: Secondary | ICD-10-CM | POA: Diagnosis not present

## 2016-05-05 DIAGNOSIS — F17210 Nicotine dependence, cigarettes, uncomplicated: Secondary | ICD-10-CM | POA: Diagnosis not present

## 2016-05-07 ENCOUNTER — Encounter (HOSPITAL_COMMUNITY): Payer: Self-pay | Admitting: Physician Assistant

## 2016-05-07 ENCOUNTER — Inpatient Hospital Stay (HOSPITAL_COMMUNITY): Payer: Medicare Other

## 2016-05-07 ENCOUNTER — Inpatient Hospital Stay (HOSPITAL_COMMUNITY)
Admission: AD | Admit: 2016-05-07 | Discharge: 2016-05-10 | DRG: 065 | Disposition: A | Discharge status: Rehabilitation Facility | Payer: Medicare Other | Source: Other Acute Inpatient Hospital | Attending: Internal Medicine | Admitting: Internal Medicine

## 2016-05-07 DIAGNOSIS — Z7982 Long term (current) use of aspirin: Secondary | ICD-10-CM

## 2016-05-07 DIAGNOSIS — R471 Dysarthria and anarthria: Secondary | ICD-10-CM | POA: Diagnosis present

## 2016-05-07 DIAGNOSIS — I6789 Other cerebrovascular disease: Secondary | ICD-10-CM | POA: Diagnosis present

## 2016-05-07 DIAGNOSIS — F17210 Nicotine dependence, cigarettes, uncomplicated: Secondary | ICD-10-CM | POA: Diagnosis present

## 2016-05-07 DIAGNOSIS — K5900 Constipation, unspecified: Secondary | ICD-10-CM | POA: Diagnosis not present

## 2016-05-07 DIAGNOSIS — I69322 Dysarthria following cerebral infarction: Secondary | ICD-10-CM | POA: Diagnosis not present

## 2016-05-07 DIAGNOSIS — I63311 Cerebral infarction due to thrombosis of right middle cerebral artery: Secondary | ICD-10-CM | POA: Diagnosis present

## 2016-05-07 DIAGNOSIS — I959 Hypotension, unspecified: Secondary | ICD-10-CM | POA: Diagnosis not present

## 2016-05-07 DIAGNOSIS — F331 Major depressive disorder, recurrent, moderate: Secondary | ICD-10-CM | POA: Diagnosis not present

## 2016-05-07 DIAGNOSIS — N179 Acute kidney failure, unspecified: Secondary | ICD-10-CM | POA: Diagnosis present

## 2016-05-07 DIAGNOSIS — E11649 Type 2 diabetes mellitus with hypoglycemia without coma: Secondary | ICD-10-CM | POA: Diagnosis not present

## 2016-05-07 DIAGNOSIS — R1310 Dysphagia, unspecified: Secondary | ICD-10-CM | POA: Diagnosis present

## 2016-05-07 DIAGNOSIS — E785 Hyperlipidemia, unspecified: Secondary | ICD-10-CM | POA: Diagnosis present

## 2016-05-07 DIAGNOSIS — E119 Type 2 diabetes mellitus without complications: Secondary | ICD-10-CM | POA: Diagnosis not present

## 2016-05-07 DIAGNOSIS — F32A Depression, unspecified: Secondary | ICD-10-CM

## 2016-05-07 DIAGNOSIS — I6930 Unspecified sequelae of cerebral infarction: Secondary | ICD-10-CM | POA: Diagnosis not present

## 2016-05-07 DIAGNOSIS — Z885 Allergy status to narcotic agent status: Secondary | ICD-10-CM

## 2016-05-07 DIAGNOSIS — F411 Generalized anxiety disorder: Secondary | ICD-10-CM

## 2016-05-07 DIAGNOSIS — F17200 Nicotine dependence, unspecified, uncomplicated: Secondary | ICD-10-CM | POA: Diagnosis not present

## 2016-05-07 DIAGNOSIS — I10 Essential (primary) hypertension: Secondary | ICD-10-CM | POA: Diagnosis not present

## 2016-05-07 DIAGNOSIS — Z720 Tobacco use: Secondary | ICD-10-CM

## 2016-05-07 DIAGNOSIS — W06XXXA Fall from bed, initial encounter: Secondary | ICD-10-CM | POA: Diagnosis not present

## 2016-05-07 DIAGNOSIS — I152 Hypertension secondary to endocrine disorders: Secondary | ICD-10-CM | POA: Diagnosis not present

## 2016-05-07 DIAGNOSIS — Z8673 Personal history of transient ischemic attack (TIA), and cerebral infarction without residual deficits: Secondary | ICD-10-CM | POA: Diagnosis not present

## 2016-05-07 DIAGNOSIS — K219 Gastro-esophageal reflux disease without esophagitis: Secondary | ICD-10-CM | POA: Diagnosis present

## 2016-05-07 DIAGNOSIS — Z794 Long term (current) use of insulin: Secondary | ICD-10-CM | POA: Diagnosis not present

## 2016-05-07 DIAGNOSIS — F4323 Adjustment disorder with mixed anxiety and depressed mood: Secondary | ICD-10-CM | POA: Diagnosis not present

## 2016-05-07 DIAGNOSIS — F329 Major depressive disorder, single episode, unspecified: Secondary | ICD-10-CM | POA: Diagnosis present

## 2016-05-07 DIAGNOSIS — E109 Type 1 diabetes mellitus without complications: Secondary | ICD-10-CM | POA: Diagnosis not present

## 2016-05-07 DIAGNOSIS — I639 Cerebral infarction, unspecified: Secondary | ICD-10-CM | POA: Diagnosis present

## 2016-05-07 DIAGNOSIS — E1165 Type 2 diabetes mellitus with hyperglycemia: Secondary | ICD-10-CM | POA: Diagnosis not present

## 2016-05-07 DIAGNOSIS — N289 Disorder of kidney and ureter, unspecified: Secondary | ICD-10-CM | POA: Diagnosis not present

## 2016-05-07 DIAGNOSIS — I69351 Hemiplegia and hemiparesis following cerebral infarction affecting right dominant side: Secondary | ICD-10-CM | POA: Diagnosis not present

## 2016-05-07 DIAGNOSIS — R4701 Aphasia: Secondary | ICD-10-CM | POA: Diagnosis present

## 2016-05-07 DIAGNOSIS — I69391 Dysphagia following cerebral infarction: Secondary | ICD-10-CM

## 2016-05-07 DIAGNOSIS — Z880 Allergy status to penicillin: Secondary | ICD-10-CM | POA: Diagnosis not present

## 2016-05-07 DIAGNOSIS — R29705 NIHSS score 5: Secondary | ICD-10-CM | POA: Diagnosis present

## 2016-05-07 DIAGNOSIS — Z8249 Family history of ischemic heart disease and other diseases of the circulatory system: Secondary | ICD-10-CM

## 2016-05-07 DIAGNOSIS — R52 Pain, unspecified: Secondary | ICD-10-CM | POA: Diagnosis not present

## 2016-05-07 DIAGNOSIS — E1149 Type 2 diabetes mellitus with other diabetic neurological complication: Secondary | ICD-10-CM | POA: Diagnosis not present

## 2016-05-07 DIAGNOSIS — R29810 Facial weakness: Secondary | ICD-10-CM | POA: Diagnosis not present

## 2016-05-07 DIAGNOSIS — D72829 Elevated white blood cell count, unspecified: Secondary | ICD-10-CM | POA: Diagnosis present

## 2016-05-07 DIAGNOSIS — S39012A Strain of muscle, fascia and tendon of lower back, initial encounter: Secondary | ICD-10-CM | POA: Diagnosis not present

## 2016-05-07 DIAGNOSIS — G8194 Hemiplegia, unspecified affecting left nondominant side: Secondary | ICD-10-CM | POA: Diagnosis not present

## 2016-05-07 DIAGNOSIS — D62 Acute posthemorrhagic anemia: Secondary | ICD-10-CM | POA: Diagnosis present

## 2016-05-07 DIAGNOSIS — R251 Tremor, unspecified: Secondary | ICD-10-CM | POA: Diagnosis present

## 2016-05-07 DIAGNOSIS — Z79899 Other long term (current) drug therapy: Secondary | ICD-10-CM | POA: Diagnosis not present

## 2016-05-07 DIAGNOSIS — R531 Weakness: Secondary | ICD-10-CM | POA: Diagnosis not present

## 2016-05-07 DIAGNOSIS — W19XXXD Unspecified fall, subsequent encounter: Secondary | ICD-10-CM | POA: Diagnosis present

## 2016-05-07 DIAGNOSIS — F419 Anxiety disorder, unspecified: Secondary | ICD-10-CM | POA: Diagnosis present

## 2016-05-07 DIAGNOSIS — F319 Bipolar disorder, unspecified: Secondary | ICD-10-CM | POA: Diagnosis not present

## 2016-05-07 DIAGNOSIS — I340 Nonrheumatic mitral (valve) insufficiency: Secondary | ICD-10-CM

## 2016-05-07 DIAGNOSIS — I69354 Hemiplegia and hemiparesis following cerebral infarction affecting left non-dominant side: Secondary | ICD-10-CM | POA: Diagnosis not present

## 2016-05-07 DIAGNOSIS — R29818 Other symptoms and signs involving the nervous system: Secondary | ICD-10-CM | POA: Diagnosis not present

## 2016-05-07 DIAGNOSIS — R2689 Other abnormalities of gait and mobility: Secondary | ICD-10-CM | POA: Diagnosis present

## 2016-05-07 DIAGNOSIS — I63511 Cerebral infarction due to unspecified occlusion or stenosis of right middle cerebral artery: Secondary | ICD-10-CM | POA: Diagnosis not present

## 2016-05-07 DIAGNOSIS — I69319 Unspecified symptoms and signs involving cognitive functions following cerebral infarction: Secondary | ICD-10-CM | POA: Diagnosis not present

## 2016-05-07 DIAGNOSIS — E1151 Type 2 diabetes mellitus with diabetic peripheral angiopathy without gangrene: Secondary | ICD-10-CM | POA: Diagnosis present

## 2016-05-07 DIAGNOSIS — E1159 Type 2 diabetes mellitus with other circulatory complications: Secondary | ICD-10-CM | POA: Diagnosis not present

## 2016-05-07 DIAGNOSIS — R4189 Other symptoms and signs involving cognitive functions and awareness: Secondary | ICD-10-CM | POA: Diagnosis present

## 2016-05-07 DIAGNOSIS — R4781 Slurred speech: Secondary | ICD-10-CM | POA: Diagnosis not present

## 2016-05-07 DIAGNOSIS — E0800 Diabetes mellitus due to underlying condition with hyperosmolarity without nonketotic hyperglycemic-hyperosmolar coma (NKHHC): Secondary | ICD-10-CM | POA: Diagnosis not present

## 2016-05-07 DIAGNOSIS — M533 Sacrococcygeal disorders, not elsewhere classified: Secondary | ICD-10-CM | POA: Diagnosis present

## 2016-05-07 DIAGNOSIS — G8314 Monoplegia of lower limb affecting left nondominant side: Secondary | ICD-10-CM | POA: Diagnosis not present

## 2016-05-07 LAB — CBC
HCT: 37.9 % (ref 36.0–46.0)
Hemoglobin: 13.4 g/dL (ref 12.0–15.0)
MCH: 30 pg (ref 26.0–34.0)
MCHC: 35.4 g/dL (ref 30.0–36.0)
MCV: 84.8 fL (ref 78.0–100.0)
Platelets: 236 10*3/uL (ref 150–400)
RBC: 4.47 MIL/uL (ref 3.87–5.11)
RDW: 12.3 % (ref 11.5–15.5)
WBC: 14.1 10*3/uL — ABNORMAL HIGH (ref 4.0–10.5)

## 2016-05-07 LAB — COMPREHENSIVE METABOLIC PANEL
ALT: 12 U/L — ABNORMAL LOW (ref 14–54)
AST: 15 U/L (ref 15–41)
Albumin: 3.5 g/dL (ref 3.5–5.0)
Alkaline Phosphatase: 74 U/L (ref 38–126)
Anion gap: 9 (ref 5–15)
BUN: 12 mg/dL (ref 6–20)
CO2: 25 mmol/L (ref 22–32)
Calcium: 9.5 mg/dL (ref 8.9–10.3)
Chloride: 106 mmol/L (ref 101–111)
Creatinine, Ser: 0.89 mg/dL (ref 0.44–1.00)
GFR calc Af Amer: >60 mL/min (ref >60)
GFR calc non Af Amer: >60 mL/min (ref >60)
Glucose, Bld: 185 mg/dL — ABNORMAL HIGH (ref 65–99)
Potassium: 3.5 mmol/L (ref 3.5–5.1)
Sodium: 140 mmol/L (ref 135–145)
Total Bilirubin: 0.5 mg/dL (ref 0.3–1.2)
Total Protein: 6.4 g/dL — ABNORMAL LOW (ref 6.5–8.1)

## 2016-05-07 LAB — VAS US CAROTID
LEFT ECA DIAS: -16 cm/s
LEFT VERTEBRAL DIAS: 13 cm/s
Left CCA dist dias: 18 cm/s
Left CCA dist sys: 81 cm/s
Left CCA prox dias: 27 cm/s
Left CCA prox sys: 135 cm/s
Left ICA dist dias: -27 cm/s
Left ICA dist sys: -86 cm/s
Left ICA prox dias: 24 cm/s
Left ICA prox sys: 76 cm/s
RIGHT ECA DIAS: -13 cm/s
RIGHT VERTEBRAL DIAS: 8 cm/s
Right CCA prox dias: 11 cm/s
Right CCA prox sys: 77 cm/s
Right cca dist sys: -64 cm/s

## 2016-05-07 LAB — LIPID PANEL
Cholesterol: 181 mg/dL (ref 0–200)
HDL: 59 mg/dL (ref >40)
LDL Cholesterol: 107 mg/dL — ABNORMAL HIGH (ref 0–99)
Total CHOL/HDL Ratio: 3.1 RATIO
Triglycerides: 74 mg/dL (ref <150)
VLDL: 15 mg/dL (ref 0–40)

## 2016-05-07 LAB — GLUCOSE, CAPILLARY
Glucose-Capillary: 184 mg/dL — ABNORMAL HIGH (ref 65–99)
Glucose-Capillary: 199 mg/dL — ABNORMAL HIGH (ref 65–99)
Glucose-Capillary: 209 mg/dL — ABNORMAL HIGH (ref 65–99)

## 2016-05-07 LAB — ECHOCARDIOGRAM COMPLETE

## 2016-05-07 MED ORDER — ENOXAPARIN SODIUM 40 MG/0.4ML ~~LOC~~ SOLN
40.0000 mg | SUBCUTANEOUS | Status: DC
  Administered 2016-05-08 – 2016-05-10 (×3): 40 mg via SUBCUTANEOUS
  Filled 2016-05-07 (×3): qty 0.4

## 2016-05-07 MED ORDER — PROCHLORPERAZINE EDISYLATE 5 MG/ML IJ SOLN
10.0000 mg | INTRAMUSCULAR | Status: DC | PRN
  Administered 2016-05-07 – 2016-05-08 (×2): 10 mg via INTRAVENOUS
  Filled 2016-05-07 (×2): qty 2

## 2016-05-07 MED ORDER — STROKE: EARLY STAGES OF RECOVERY BOOK: Freq: Once | Status: DC

## 2016-05-07 MED ORDER — ASPIRIN 300 MG RE SUPP
300.0000 mg | Freq: Every day | RECTAL | Status: DC
  Administered 2016-05-07: 300 mg via RECTAL

## 2016-05-07 MED ORDER — ZOLPIDEM TARTRATE 5 MG PO TABS
5.0000 mg | ORAL_TABLET | Freq: Every evening | ORAL | Status: DC | PRN
  Administered 2016-05-07: 5 mg via ORAL
  Filled 2016-05-07: qty 1

## 2016-05-07 MED ORDER — ASPIRIN 325 MG PO TABS
325.0000 mg | ORAL_TABLET | Freq: Every day | ORAL | Status: DC
  Administered 2016-05-08 – 2016-05-10 (×3): 325 mg via ORAL
  Filled 2016-05-07 (×3): qty 1

## 2016-05-07 MED ORDER — ATORVASTATIN CALCIUM 40 MG PO TABS
40.0000 mg | ORAL_TABLET | Freq: Every day | ORAL | Status: DC
  Administered 2016-05-08 – 2016-05-09 (×2): 40 mg via ORAL
  Filled 2016-05-07 (×2): qty 1

## 2016-05-07 MED ORDER — SENNOSIDES-DOCUSATE SODIUM 8.6-50 MG PO TABS: 1.0000 | ORAL_TABLET | Freq: Every evening | ORAL | Status: DC | PRN

## 2016-05-07 MED ORDER — INSULIN GLARGINE 100 UNIT/ML ~~LOC~~ SOLN
20.0000 [IU] | Freq: Every day | SUBCUTANEOUS | Status: DC
  Administered 2016-05-07: 20 [IU] via SUBCUTANEOUS
  Filled 2016-05-07 (×2): qty 0.2

## 2016-05-07 MED ORDER — ONDANSETRON HCL 4 MG/2ML IJ SOLN
4.0000 mg | Freq: Four times a day (QID) | INTRAMUSCULAR | Status: DC | PRN
Start: 2016-05-07 — End: 2016-05-10
  Administered 2016-05-07 – 2016-05-09 (×4): 4 mg via INTRAVENOUS
  Filled 2016-05-07 (×5): qty 2

## 2016-05-07 MED ORDER — CLOPIDOGREL BISULFATE 75 MG PO TABS
75.0000 mg | ORAL_TABLET | Freq: Every day | ORAL | Status: DC
  Administered 2016-05-08 – 2016-05-10 (×3): 75 mg via ORAL
  Filled 2016-05-07 (×3): qty 1

## 2016-05-07 MED ORDER — BASAGLAR KWIKPEN 100 UNIT/ML ~~LOC~~ SOPN: 20.0000 [IU] | PEN_INJECTOR | SUBCUTANEOUS | Status: DC

## 2016-05-07 MED ORDER — INSULIN ASPART 100 UNIT/ML ~~LOC~~ SOLN
0.0000 [IU] | Freq: Every day | SUBCUTANEOUS | Status: DC
  Administered 2016-05-07 – 2016-05-08 (×2): 2 [IU] via SUBCUTANEOUS

## 2016-05-07 MED ORDER — INSULIN ASPART 100 UNIT/ML ~~LOC~~ SOLN
0.0000 [IU] | Freq: Three times a day (TID) | SUBCUTANEOUS | Status: DC
  Administered 2016-05-08: 11 [IU] via SUBCUTANEOUS
  Administered 2016-05-09 (×3): 5 [IU] via SUBCUTANEOUS
  Administered 2016-05-10: 3 [IU] via SUBCUTANEOUS
  Administered 2016-05-10: 8 [IU] via SUBCUTANEOUS

## 2016-05-07 NOTE — Progress Notes (Signed)
  Echocardiogram 2D Echocardiogram has been performed.  Janalyn HarderWest, Christena Sunderlin R 05/07/2016, 1:08 PM

## 2016-05-07 NOTE — Progress Notes (Signed)
Speech Language Pathology  Patient Details Name: Chloe Owens MRN: [id number] DOB: [birth year] Today's Date: 05/07/2016 Time:  -     ST has orders for cognitive assessment only. MD ordered swallow screen, does not appear to have been initiated. Will see for cognitive eval 12/4  [name], Shashank Kwasnik Willis 05/07/2016, 3:12 PM

## 2016-05-07 NOTE — H&P (Signed)
History and Physical    Chloe Owens WRU:[id number] DOB: 15-Apr-1956 DOA: 05/07/2016   PCP: Pcp Not In System   Patient coming from:  Home   Chief Complaint: Left sided weakness   HPI: Chloe Owens is a 60 y.o. female with a prior history of CVA in 2009, hypertension, hyperlipidemia, diabetes, Genella Rife, depression, presenting with left-sided weakness to [facility]. The symptoms started around 6 PM on December 2. Symptoms are similar to do those of her prior stroke, at which time, she did not suffer any residuals. Denies vertigo dizziness or vision changes. Denies headaches,or dysphagia. She does report mildly facial weakness. No confusion or seizures. Denies any chest pain, or shortness of breath. Denies any fever or chills, or night sweats. No tobacco. No new meds or hormonal supplements. Does take a regular ASA a day, with no other antiplatelets or anticoagulants. Denies any recent long distance trips or recent surgeries. No sick contacts. No new stressors present in personal life. Patient is compliant with his medications. No family history of stroke. She smokes daily, about one pack a day. She does not drink or partakes recreational drugs. Her family requested transfer to [facility] for further intervention and treatment. Will admit for further evaluation and treatment.  ED Course:  There were no vitals taken for this visit.    [facility] head at [facility] showed subacute right internal capsule infarction. All her labs are currently pending. Review of Systems: As per HPI otherwise 10 point review of systems negative.   Past Medical History:  Diagnosis Date  . Anxiety   . Bladder infection    hx of  . Depression   . Diabetes mellitus without complication (HCC)   . GERD (gastroesophageal reflux disease)   . Hyperlipidemia   . Hypertension   . Neuromuscular disorder (HCC)   . Stroke [facility])    05/2008    Past Surgical History:  Procedure Laterality Date  . ANKLE CLOSED  REDUCTION  04/15/2012   Procedure: CLOSED REDUCTION ANKLE;  Surgeon: Senaida Lange, MD;  Location: [facility] OR;  Service: Orthopedics;  Laterality: Left;  . CARPAL TUNNEL RELEASE     bilaterally  . ORIF ANKLE FRACTURE  05/09/2012   Procedure: OPEN REDUCTION INTERNAL FIXATION (ORIF) ANKLE FRACTURE;  Surgeon: Toni Arthurs, MD;  Location: [facility] OR;  Service: Orthopedics;  Laterality: Left;  ORIF VERSES EXFIX LEFT ANKLE FRACTURE    Social History Social History   Social History  . Marital status: Married    Spouse name: N/A  . Number of children: N/A  . Years of education: N/A   Occupational History  . Not on file.   Social History Main Topics  . Smoking status: Current Every Day Smoker    Packs/day: 1.00    Types: Cigarettes    Last attempt to quit: 04/15/2012  . Smokeless tobacco: Never Used  . Alcohol use No  . Drug use: No  . Sexual activity: No   Other Topics Concern  . Not on file   Social History Narrative  . No narrative on file     Allergies  Allergen Reactions  . Codeine Nausea And Vomiting and Rash  . Penicillins Rash    Family History  Problem Relation Age of Onset  . CAD Father   . CAD Mother   . Cancer - Other Mother     Breast  . Cancer - Other Brother     Unknown type      Prior to Admission  medications   Medication Sig Start Date End Date Taking? Authorizing Provider  ALPRAZolam (XANAX) 0.25 MG tablet Take 0.25 mg by mouth 3 (three) times daily.     Historical Provider, MD  aspirin 81 MG tablet Take 81 mg by mouth daily.    Historical Provider, MD  aspirin EC 325 MG tablet Take 1 tablet (325 mg total) by mouth daily. Patient not taking: Reported on 05/23/2014 05/10/12   Toni ArthursJohn Hewitt, MD  calcium-vitamin D (OSCAL WITH D) 500-200 MG-UNIT per tablet Take 1 tablet by mouth 2 (two) times daily.    Historical Provider, MD  cholecalciferol (VITAMIN D) 1000 UNITS tablet Take 1,000 Units by mouth daily.    Historical Provider, MD  clonazePAM (KLONOPIN) 1 MG  tablet Take 1 mg by mouth 3 (three) times daily.    Historical Provider, MD  divalproex (DEPAKOTE ER) 500 MG 24 hr tablet Take 500 mg by mouth 2 (two) times daily.    Historical Provider, MD  escitalopram (LEXAPRO) 20 MG tablet Take 20 mg by mouth 2 (two) times daily.    Historical Provider, MD  FLUoxetine (PROZAC) 40 MG capsule Take 40 mg by mouth 2 (two) times daily.    Historical Provider, MD  folic acid (FOLVITE) 1 MG tablet Take 1 mg by mouth daily.    Historical Provider, MD  folic acid (FOLVITE) 400 MCG tablet Take 400 mcg by mouth daily.    Historical Provider, MD  insulin aspart (NOVOLOG) 100 UNIT/ML injection Inject 2-15 Units into the skin 3 (three) times daily before meals. Per sliding scale  70-100 : 0 units, 101-150 :2 units, 151-200 :3 units, 201-250 : 6 units, 251-300: 9 units, 301-350: 12 units  Greater than 350  15 units    Historical Provider, MD  insulin aspart (NOVOLOG) 100 UNIT/ML injection Inject 2-5 Units into the skin at bedtime. 201-250: 2 units, 251-300 : 3 units, 301-350: 4 units,  351: 5 units  Call MD > 450    Historical Provider, MD  insulin glargine (LANTUS) 100 UNIT/ML injection Inject 12-25 Units into the skin at bedtime.     Historical Provider, MD  lisinopril (PRINIVIL,ZESTRIL) 20 MG tablet Take 20 mg by mouth daily.    Historical Provider, MD  lisinopril-hydrochlorothiazide (PRINZIDE,ZESTORETIC) 20-12.5 MG per tablet Take 1 tablet by mouth daily.    Historical Provider, MD  metFORMIN (GLUCOPHAGE) 1000 MG tablet Take 1,000 mg by mouth 2 (two) times daily with a meal.    Historical Provider, MD  Multiple Vitamin (MULTIVITAMIN WITH MINERALS) TABS Take 1 tablet by mouth daily.    Historical Provider, MD  omeprazole (PRILOSEC) 20 MG capsule Take 20 mg by mouth 2 (two) times daily.     Historical Provider, MD  ondansetron (ZOFRAN ODT) 4 MG disintegrating tablet Take 1 tablet (4 mg total) by mouth every 4 (four) hours as needed for nausea or vomiting. 05/23/14   Arby BarretteMarcy  Pfeiffer, MD  ondansetron (ZOFRAN) 4 MG tablet Take 1 tablet (4 mg total) by mouth every 6 (six) hours as needed for nausea. Patient not taking: Reported on 05/23/2014 05/10/12   Toni ArthursJohn Hewitt, MD  oxyCODONE (OXY IR/ROXICODONE) 5 MG immediate release tablet Take 1-2 tablets (5-10 mg total) by mouth every 3 (three) hours as needed. Patient not taking: Reported on 05/23/2014 05/10/12   Toni ArthursJohn Hewitt, MD  polyethylene glycol [facility](MIRALAX / Ethelene HalGLYCOLAX) packet Take 17 g by mouth daily.  04/17/12   Tracy Shuford, PA-C  simvastatin (ZOCOR) 40 MG tablet Take 40 mg by  mouth every evening.    Historical Provider, MD  vitamin B-12 (CYANOCOBALAMIN) 1000 MCG tablet Take 1,000 mcg by mouth daily.    Historical Provider, MD  vitamin C (ASCORBIC ACID) 500 MG tablet Take 500 mg by mouth daily. For wound healing for 28 days start 04-17-12 end 05-16-12    Historical Provider, MD  zinc gluconate 50 MG tablet Take 50 mg by mouth daily.    Historical Provider, MD    Physical Exam:    There were no vitals filed for this visit.     Constitutional: NAD, calm, comfortable , flat affect  There were no vitals filed for this visit. Eyes: PERRL, lids and conjunctivae normal ENMT: Mucous membranes are moist. Posterior pharynx clear of any exudate or lesions.Normal dentition.  Neck: normal, supple, no masses, no thyromegaly Respiratory: clear to auscultation bilaterally, no wheezing, no crackles. Normal respiratory effort. No accessory muscle use.  Cardiovascular: Regular rate and rhythm,soft 1/6  murmurs / rubs / gallops. No extremity edema. 2+ pedal pulses. No carotid bruits.  Abdomen: no tenderness, no masses palpated. No hepatosplenomegaly. Bowel sounds positive.  Musculoskeletal: no clubbing / cyanosis. No joint deformity upper and lower extremities. Good ROM, no contractures. Normal muscle tone.  Skin: no rashes, lesions, ulcers.  Neurologic: strength L4/5 UE, normal on the rest extremities, sensation intact, mild left  facial weakness,, slight droop, mild left tongue deviation  Psychiatric: Normal judgment and insight. Alert and oriented x 3. Flat affect     Labs on Admission: I have personally reviewed following labs and imaging studies  CBC: No results for input(s): WBC, NEUTROABS, HGB, HCT, MCV, PLT in the last 168 hours.  Basic Metabolic Panel: No results for input(s): NA, K, CL, CO2, GLUCOSE, BUN, CREATININE, CALCIUM, MG, PHOS in the last 168 hours.  GFR: CrCl cannot be calculated (Patient's most recent lab result is older than the maximum 21 days allowed.).  Liver Function Tests: No results for input(s): AST, ALT, ALKPHOS, BILITOT, PROT, ALBUMIN in the last 168 hours. No results for input(s): LIPASE, AMYLASE in the last 168 hours. No results for input(s): AMMONIA in the last 168 hours.  Coagulation Profile: No results for input(s): INR, PROTIME in the last 168 hours.  Cardiac Enzymes: No results for input(s): CKTOTAL, CKMB, CKMBINDEX, TROPONINI in the last 168 hours.  BNP (last 3 results) No results for input(s): PROBNP in the last 8760 hours.  HbA1C: No results for input(s): HGBA1C in the last 72 hours.  CBG: No results for input(s): GLUCAP in the last 168 hours.  Lipid Profile: No results for input(s): CHOL, HDL, LDLCALC, TRIG, CHOLHDL, LDLDIRECT in the last 72 hours.  Thyroid Function Tests: No results for input(s): TSH, T4TOTAL, FREET4, T3FREE, THYROIDAB in the last 72 hours.  Anemia Panel: No results for input(s): VITAMINB12, FOLATE, FERRITIN, TIBC, IRON, RETICCTPCT in the last 72 hours.  Urine analysis:    Component Value Date/Time   COLORURINE YELLOW 06/03/2014 0440   APPEARANCEUR CLEAR 06/03/2014 0440   LABSPEC 1.042 (H) 06/03/2014 0440   PHURINE 5.5 06/03/2014 0440   GLUCOSEU >1000 (A) 06/03/2014 0440   HGBUR TRACE (A) 06/03/2014 0440   BILIRUBINUR MODERATE (A) 06/03/2014 0440   KETONESUR >80 (A) 06/03/2014 0440   PROTEINUR 30 (A) 06/03/2014 0440    UROBILINOGEN 1.0 06/03/2014 0440   NITRITE NEGATIVE 06/03/2014 0440   LEUKOCYTESUR SMALL (A) 06/03/2014 0440    Sepsis Labs: @LABRCNTIP (procalcitonin:4,lacticidven:4) )No results found for this or any previous visit (from the past 240 hour(s)).  Radiological Exams on Admission: No results found.  EKG: Independently reviewed.  Assessment/Plan Principal Problem:   Acute ischemic stroke [facility]) Active Problems:   Hypotension   Diabetes mellitus (HCC)   Hyperlipidemia   History of CVA (cerebrovascular accident)   Depression   Generalized anxiety disorder   Acute- L sided and left facial weakness/History of stroke in 2009  CT head at [facility] showed subacute right internal capsule infarction. Other workup is pending.  Admit to tele inpatient   Stroke order set -MRI/MRA brain   -MRI/MRA neck Carotid ultrasound B Allow permissive HTN -Echo  -PT/OT/SLP  lipid panel  Check A1C -Aspirin  Anticoagulation with Lovenox -Neuro to consult   Hypertension There were no vitals taken for this visit. Allow permisive HTN due to above   Hyperlipidemia Continue home statins   Type II Diabetes Current blood sugar level is unknown at this time  Lab Results  Component Value Date   HGBA1C 8.3 (H) 04/14/2012   Hgb A1C Hold home oral diabetic medications.  SSI Heart healthy carb modified diet if passing SLP .   Anxiety Continue home Xanax  Depression Continue home med with Lexapro and Depakote    GERD, no acute symptoms: Continue PPI  Tobacco abuse   -  Nicotine patch was offered but patient declined  -  Counseled cessation    DVT prophylaxis: Lovenox  Code Status:   Full     Family Communication:  Discussed with patient Disposition Plan: Expect patient to be discharged to home after condition improves Consults called:    None Admission status: Inpatient tele  Marcos Eke, PA-C [facility]   05/07/2016, 7:54 AM

## 2016-05-07 NOTE — Evaluation (Signed)
Physical Therapy Evaluation Patient Details Name: Chloe Owens MRN: [id number] DOB: 12-14-1955 Today's Date: 05/07/2016   History of Present Illness  60 year old lady with past medical history of hypertension, hyperlipidemia, diabetes mellitus, stroke, GERD, depression, who presents with left-sided weakness and left facial numbness. Acute RIGHT MCA lenticulostriate territory infarct.  Clinical Impression  Patient demonstrates deficits in functional mobility as indicated below. Will need continued skilled PT to address deficits and maximize function. Will see as indicated and progress as tolerated. OF NOTE: prior to admission patient was very independent. At this time, given current impairments and patient functional status, highly recommend CIR upon acute discharge.     Follow Up Recommendations CIR    Equipment Recommendations  Other (comment) (TBD)    Recommendations for Other Services       Precautions / Restrictions Precautions Precautions: Fall Restrictions Weight Bearing Restrictions: No      Mobility  Bed Mobility Overal bed mobility: Needs Assistance Bed Mobility: Rolling;Sidelying to Sit;Sit to Sidelying Rolling: Min assist Sidelying to sit: Mod assist     Sit to sidelying: Min assist General bed mobility comments: Assist to initiate RUE for rolling to L side. Assist for trunk elevation with sidelying to sit. Light min assist for LEs back to bed. Cues throughout for sequencing and technique.  Transfers Overall transfer level: Needs assistance Equipment used: Rolling walker (2 wheeled) Transfers: Sit to/from Stand Sit to Stand: Min assist;+2 physical assistance         General transfer comment: Min assist +2 for sit to stand from EOB. Cues for hand placement and technique.  Ambulation/Gait Ambulation/Gait assistance: Min assist;+2 physical assistance (moderate assist for faciliatation techniques) Ambulation Distance (Feet): 4 Feet Assistive device: Rolling  walker (2 wheeled) Gait Pattern/deviations: Step-through pattern;Decreased stride length;Decreased dorsiflexion - left;Decreased weight shift to right;Ataxic Gait velocity: decreased Gait velocity interpretation: <1.8 ft/sec, indicative of risk for recurrent falls General Gait Details: patient required faciliatation of weight shift, and multi modal cues for step and placement of LLE. Noted impaired coordination   Stairs            Wheelchair Mobility    Modified Rankin (Stroke Patients Only) Modified Rankin (Stroke Patients Only) Pre-Morbid Rankin Score: No symptoms Modified Rankin: Moderately severe disability     Balance Overall balance assessment: Needs assistance Sitting-balance support: Feet supported;Bilateral upper extremity supported Sitting balance-Leahy Scale: Fair   Postural control: Left lateral lean Standing balance support: Bilateral upper extremity supported Standing balance-Leahy Scale: Poor Standing balance comment: RW for support                             Pertinent Vitals/Pain Pain Assessment: No/denies pain    Home Living Family/patient expects to be discharged to:: Private residence Living Arrangements: Spouse/significant other Available Help at Discharge: Family Type of Home: House Home Access: Stairs to enter Entrance Stairs-Rails: None Entrance Stairs-Number of Steps: 2-3 Home Layout: One level Home Equipment: Environmental consultantWalker - 2 wheels      Prior Function Level of Independence: Independent               Hand Dominance   Dominant Hand: Right    Extremity/Trunk Assessment   Upper Extremity Assessment: LUE deficits/detail       LUE Deficits / Details: grossly 3+/5. decreased grip strength. Impaired fine/gross motor coordination. Pt reports sensation intact.   Lower Extremity Assessment: LLE deficits/detail   LLE Deficits / Details: weakness noted LLE, 3-/5 gross motions,  2/5 dorsiflexion  Cervical / Trunk Assessment:  Normal  Communication   Communication: Expressive difficulties  Cognition Arousal/Alertness: Awake/alert Behavior During Therapy: Flat affect Overall Cognitive Status: Impaired/Different from baseline Area of Impairment: Following commands;Problem solving       Following Commands: Follows one step commands consistently;Follows one step commands with increased time     Problem Solving: Slow processing;Decreased initiation;Difficulty sequencing;Requires verbal cues;Requires tactile cues General Comments: Pt slow to initiate but able to follow one step commands. Pt perseverating on gross motor coordination testing.    General Comments      Exercises     Assessment/Plan    PT Assessment Patient needs continued PT services  PT Problem List Decreased strength;Decreased activity tolerance;Decreased balance;Decreased mobility;Decreased coordination;Decreased cognition;Decreased safety awareness          PT Treatment Interventions DME instruction;Gait training;Stair training;Functional mobility training;Therapeutic activities;Therapeutic exercise;Balance training;Neuromuscular re-education;Cognitive remediation;Patient/family education    PT Goals (Current goals can be found in the Care Plan section)  Acute Rehab PT Goals Patient Stated Goal: to get back to indpendence PT Goal Formulation: With patient/family Time For Goal Achievement: 05/21/16 Potential to Achieve Goals: Good    Frequency Min 4X/week   Barriers to discharge        Co-evaluation PT/OT/SLP Co-Evaluation/Treatment: Yes Reason for Co-Treatment: For patient/therapist safety;Complexity of the patient's impairments (multi-system involvement) PT goals addressed during session: Mobility/safety with mobility OT goals addressed during session: ADL's and self-care       End of Session Equipment Utilized During Treatment: Gait belt;Oxygen Activity Tolerance: Patient tolerated treatment well Patient left: in  bed;with call bell/phone within reach;with family/visitor present Nurse Communication: Mobility status         Time: 1547-1610 PT Time Calculation (min) (ACUTE ONLY): 23 min   Charges:   PT Evaluation $PT Eval Moderate Complexity: 1 Procedure     PT G Codes:        Fabio AsaDevon J Jmichael Gille 05/07/2016, 4:33 PM Charlotte Crumbevon Pualani Borah, PT DPT  [phone]

## 2016-05-07 NOTE — Progress Notes (Signed)
*  PRELIMINARY RESULTS* Vascular Ultrasound Carotid Duplex (Doppler) has been completed.  Findings suggest 1-39% internal carotid artery stenosis bilaterally. Vertebral arteries are patent with antegrade flow.  05/07/2016 12:29 PM Gertie FeyMichelle Keante Urizar, BS, RVT, RDCS, RDMS

## 2016-05-07 NOTE — Consult Note (Signed)
Neurology Consult Note  Reason for Consultation: Stroke  Requesting provider: Candelaria CelesteJacob Stinson, MD  CC: Trouble walking  HPI: This is a 60-yo RH woman who [facility] for evaluation of possible stroke. History is obtained from the patient as well as from review of available medical records.   [facility] today for the evaluation of slurred speech and left-sided weakness. This started around 1800 on 05/06/16 but she initially refused to come to the ED as requested by her husband. They reported that she had a prior stroke but this only affected her memory according to them. NIHSS score was 5 (2 for facial paralysis, 1 for left arm drift, 1 for best language, and 1 for dysarthria). CTH without contrast was obtained and interpreted as showing a 14 mm hypodensity in the posterior limb of the R internal capsule, suspicious for an acute or early subacute ischemic infarction. She was transferred to [facility] at the family's request. She was taking aspirin 81 mg daily and simvastatin 40 mg daily prior to this admission.   Last known well: 05/06/16 1800 NHISS score: 5 in OSH ED mRS score: 0 tPA given?: No, outside of window at OSH  Labs from OSH:  CMP notable for glucose 188 Troponin <0.01 PT 9.6, INR 0.9 PTT 23.3 CBC notable for wbc 11.2K  PMH:  Past Medical History:  Diagnosis Date  . Anxiety   . Bladder infection    hx of  . Depression   . Diabetes mellitus without complication (HCC)   . GERD (gastroesophageal reflux disease)   . Hyperlipidemia   . Hypertension   . Neuromuscular disorder (HCC)   . Stroke [facility](HCC)    05/2008    PSH:  Past Surgical History:  Procedure Laterality Date  . ANKLE CLOSED REDUCTION  04/15/2012   Procedure: CLOSED REDUCTION ANKLE;  Surgeon: Senaida LangeKevin M Supple, MD;  Location: [facility] OR;  Service: Orthopedics;  Laterality: Left;  . CARPAL TUNNEL RELEASE     bilaterally  . ORIF ANKLE FRACTURE   05/09/2012   Procedure: OPEN REDUCTION INTERNAL FIXATION (ORIF) ANKLE FRACTURE;  Surgeon: Toni ArthursJohn Hewitt, MD;  Location: [facility] OR;  Service: Orthopedics;  Laterality: Left;  ORIF VERSES EXFIX LEFT ANKLE FRACTURE    Family history: Family History  Problem Relation Age of Onset  . CAD Father   . CAD Mother   . Cancer - Other Mother     Breast  . Cancer - Other Brother     Unknown type    Social history:  Social History   Social History  . Marital status: Married    Spouse name: N/A  . Number of children: N/A  . Years of education: N/A   Occupational History  . Not on file.   Social History Main Topics  . Smoking status: Current Every Day Smoker    Packs/day: 1.00    Types: Cigarettes    Last attempt to quit: 04/15/2012  . Smokeless tobacco: Never Used  . Alcohol use No  . Drug use: No  . Sexual activity: No   Other Topics Concern  . Not on file   Social History Narrative  . No narrative on file    Current outpatient meds: Current Meds  Medication Sig  . ARIPiprazole (ABILIFY) 30 MG tablet Take 30 mg by mouth 2 (two) times daily.  Marland Kitchen. aspirin EC 325 MG tablet Take 1 tablet (325 mg total) by mouth daily.  .Marland Kitchen  buPROPion (WELLBUTRIN SR) 150 MG 12 hr tablet Take 150 mg by mouth 2 (two) times daily.  . clindamycin (CLEOCIN) 150 MG capsule Take 150 mg by mouth 3 (three) times daily.  . clonazePAM (KLONOPIN) 1 MG tablet Take 1 mg by mouth 2 (two) times daily.   . Insulin Glargine (BASAGLAR KWIKPEN) 100 UNIT/ML SOPN Inject 45 Units into the skin every morning.  Marland Kitchen. JANUVIA 100 MG tablet Take 100 mg by mouth every evening.  Marland Kitchen. lisinopril (PRINIVIL,ZESTRIL) 20 MG tablet Take 20 mg by mouth daily.  Marland Kitchen. omeprazole (PRILOSEC) 20 MG capsule Take 20 mg by mouth 2 (two) times daily.   . simvastatin (ZOCOR) 40 MG tablet Take 40 mg by mouth every evening.    Current inpatient meds:  Current Facility-Administered Medications  Medication Dose Route Frequency Provider Last Rate Last Dose  .   stroke: mapping our early stages of recovery book   Does not apply Once Levie HeritageJacob J Stinson, DO      . aspirin suppository 300 mg  300 mg Rectal Daily Levie HeritageJacob J Stinson, DO       Or  . aspirin tablet 325 mg  325 mg Oral Daily Rhona RaiderJacob J Stinson, DO      . enoxaparin (LOVENOX) injection 40 mg  40 mg Subcutaneous Q24H Jacob J Stinson, DO      . insulin aspart (novoLOG) injection 0-15 Units  0-15 Units Subcutaneous TID WC Rhona RaiderJacob J Stinson, DO      . insulin aspart (novoLOG) injection 0-5 Units  0-5 Units Subcutaneous QHS Rhona RaiderJacob J Stinson, DO      . ondansetron [facility](ZOFRAN) injection 4 mg  4 mg Intravenous Q6H PRN Rhona RaiderJacob J Stinson, DO   4 mg at 05/07/16 1411  . senna-docusate (Senokot-S) tablet 1 tablet  1 tablet Oral QHS PRN Levie HeritageJacob J Stinson, DO        Allergies: Allergies  Allergen Reactions  . Codeine Nausea And Vomiting and Rash  . Penicillins Rash    ROS: As per HPI. A full 14-point review of systems was performed and is otherwise notable for nausea and intermittent RUE tremor. It is otherwise negative.   PE:  BP (!) 165/84 (BP Location: Left Arm)   Pulse 93   Temp 98.8 F (37.1 C) (Oral)   Resp 18   SpO2 99%   General: WDWN, no acute distress. AAO x4. Speech clear, no dysarthria. No aphasia. Follows commands briskly. Affect is flat. Comportment is normal.  HEENT: Normocephalic. Neck supple without LAD. MMM, OP clear. Dentition good. Sclerae anicteric. No conjunctival injection.  CV: Regular, no murmur. Carotid pulses full and symmetric, no bruits. Distal pulses 2+ and symmetric.  Lungs: CTAB.  Abdomen: Soft, non-distended, non-tender. Bowel sounds present x4.  Extremities: No C/C/E. Neuro:  CN: Pupils are equal and round. They are symmetrically reactive from 3-->2 mm. Visual fields are full to confrontation. EOMs notable for moderate breakup of smooth pursuits, no nystagmus. No reported diplopia. Facial sensation is intact to light touch. She has a mild L central VII pattern of weakness. Hearing  is intact to conversational voice. Palate elevates symmetrically and uvula is midline. Voice is normal in tone, pitch and quality. Bilateral SCM and trapezii are 5/5. Tongue is midline with normal bulk and mobility.  Motor: Normal bulk, tone, and strength with the exception of 4+/5 L finger extension and wrist extension in the LUE; 4+/5 L HF and KF; and 4/5 L DF/PF. She has an irregular moderate frequency action tremor in the  R arm. She has a left pronator drift.   Sensation: Intact to light touch and pinprick.  DTRs: Brisk 2+ on the R, 3+ on the L. Toes downgoing on the R, upgoing on the L.  Coordination: Finger-to-nose and heel-to-shin are clumsy on the L. Finger taps are slow and deliberate on the L.    Labs:  Lab Results  Component Value Date   WBC 14.1 (H) 05/07/2016   HGB 13.4 05/07/2016   HCT 37.9 05/07/2016   PLT 236 05/07/2016   GLUCOSE 185 (H) 05/07/2016   CHOL 181 05/07/2016   TRIG 74 05/07/2016   HDL 59 05/07/2016   LDLCALC 107 (H) 05/07/2016   ALT 12 (L) 05/07/2016   AST 15 05/07/2016   NA 140 05/07/2016   K 3.5 05/07/2016   CL 106 05/07/2016   CREATININE 0.89 05/07/2016   BUN 12 05/07/2016   CO2 25 05/07/2016   TSH  05/25/2008   INR 1.0 05/25/2008   HGBA1C 8.3 (H) 04/14/2012    Imaging:  I have personally and independently reviewed the MRI of the brain without contrast from today. This shows restricted diffusion involving the posterior limb of the right internal capsule consistent with an acute ischemic infarction. There is a moderate burden of chronic small vessel ischemic disease involving the bihemispheric white matter.   I have personally and independently reviewed the MRA of the head without contrast from today. This shows irregularity in the R PCA consistent with diffuse atherosclerotic disease. There appears to be occlusion of the L PCA at the end of the P1 segment. The anterior circulation appears unremarkable.   Other diagnostic studies:  TTE showed  moderate concentric LVH with EF 65-70%. No regional wall motion abnormalities were seen. Grade 1 diastolic dysfunction was noted. There is heavy calcification of the mitral annulus, greatest posteriorly. Mild mitral regurgitation.   Carotid dopplers show no significant stenosis.   Assessment and Plan:  1. Acute Ischemic Stroke: This is an acute stroke involving the R lenticulostriate territory. It is most likely thrombotic in etiology due to small vessel lipohyalinosis. Known risk factors for cerebrovascular disease in this patient include DM, HTN, hyperlipidemia, and prior stroke. MRA of the head shows some atherosclerotic changes in the PCAs bilaterally. TTE showed severe calcification of the mitral annulus. Carotid Dopplers showed no hemodynamically significant stenosis. LDL elevated at 107. Hgb a1c pending. Continue aspirin 325 mg daily. Continue statin with goal LDL less than 70. Ensure adequate glucose control. Allow permissive hypertension in the acute phase, treating only SBP greater than 220 mmHg and/or DBP greater than 110 mmHg. Avoid fever and hyperglycemia as these can extend the infarct. Initiate rehab services. DVT prophylaxis as needed.   2. L hemiparesis: This is acute and mild, due to stroke. PT/OT/rehab.   3. Dysarthria: This was initially reported but has improved. Follow.   4. Abnormality of gait: This is acute, due to stroke. PT.   5. RUE tremor: She reports that she has an intermittent tremor of the RUE and this is presently at baseline. No acute issues.   This was discussed with the patient and she is in agreement with the plan as noted. She was given the opportunity to ask questions and these were addressed to her satisfaction.   Thank you for this consult. The stroke team will assume care of the patient starting 05/08/16. Please call with any questions.

## 2016-05-07 NOTE — Progress Notes (Signed)
This is a no charge note  Transfer [facility] per Dr. Jeri LagerGuha.  60 year old lady with past medical history of hypertension, hyperlipidemia, diabetes mellitus, stroke, GERD, depression, who presents with left-sided weakness and left facial numbness. Symptoms started at 6 PM yesterday. CT head showed subacute right internal capsule infarction. Family required patient to be transported to [facility] for further intervention and treatment. Since I expect pt will not be able to come before shift change. I did not ask EDP to consult neurology. Please call neuro at pt's arrival.   Blood pressure 146/78, heart rate 115, respiration rate 27, oxygen saturation 94%, temperature normal. Electrolytes and CBC pending.  Lorretta HarpXilin Reianna Batdorf, MD  [facility] Pager [phone]  If 7PM-7AM, please contact night-coverage [email or web address] Password [email or web address] 05/07/2016, 6:03 AM

## 2016-05-07 NOTE — Evaluation (Signed)
Occupational Therapy Evaluation Patient Details Name: Chloe Owens MRN: [id number] DOB: 1955-09-04 Today's Date: 05/07/2016    History of Present Illness 60 year old lady with past medical history of hypertension, hyperlipidemia, diabetes mellitus, stroke, GERD, depression, who presents with left-sided weakness and left facial numbness. Acute RIGHT MCA lenticulostriate territory infarct.   Clinical Impression   Pt reports she was independent with ADL PTA. Currently pt overall min assist +2 for functional mobility, mod assist for UB ADL in sitting, and max assist for LB ADL. Pt presenting with impaired cognition, ?visual deficits, LUE weakness/poor coordination, poor sitting/standing balance impacting her independence and safety with ADL and functional mobility. Recommending CIR level therapies for follow up to maximize independence and safety with ADL and functional mobility prior to return home. Pt would benefit from continued skilled OT to address established goals.     Follow Up Recommendations  CIR;Supervision/Assistance - 24 hour    Equipment Recommendations  Other (comment) (TBD at next venue)    Recommendations for Other Services Rehab consult;Speech consult     Precautions / Restrictions Precautions Precautions: Fall Restrictions Weight Bearing Restrictions: No      Mobility Bed Mobility Overal bed mobility: Needs Assistance Bed Mobility: Rolling;Sidelying to Sit;Sit to Sidelying Rolling: Min assist Sidelying to sit: Mod assist     Sit to sidelying: Min assist General bed mobility comments: Assist to initiate RUE for rolling to L side. Assist for trunk elevation with sidelying to sit. Light min assist for LEs back to bed. Cues throughout for sequencing and technique.  Transfers Overall transfer level: Needs assistance Equipment used: Rolling walker (2 wheeled) Transfers: Sit to/from Stand Sit to Stand: Min assist;+2 physical assistance         General  transfer comment: Min assist +2 for sit to stand from EOB. Cues for hand placement and technique.    Balance Overall balance assessment: Needs assistance Sitting-balance support: Feet supported;Bilateral upper extremity supported Sitting balance-Leahy Scale: Fair   Postural control: Left lateral lean Standing balance support: Bilateral upper extremity supported Standing balance-Leahy Scale: Poor Standing balance comment: RW for support                            ADL Overall ADL's : Needs assistance/impaired Eating/Feeding: NPO   Grooming: Minimal assistance;Sitting   Upper Body Bathing: Moderate assistance;Sitting   Lower Body Bathing: Maximal assistance;Sit to/from stand   Upper Body Dressing : Moderate assistance;Sitting Upper Body Dressing Details (indicate cue type and reason): to don/doff hospital gown Lower Body Dressing: Maximal assistance;Sit to/from stand   Toilet Transfer: Minimal assistance;+2 for physical assistance;Stand-pivot Toilet Transfer Details (indicate cue type and reason): Simulated by sit to stand from EOB with few steps.         Functional mobility during ADLs: Minimal assistance;+2 for physical assistance;Rolling walker (for few steps only) General ADL Comments: Discussed need for post acute rehab with pt and family; they are agreeable. Encouraged movement and functional use of LUE. SpO2 down to 90% on RA with activity; reapplied 2L O2/Prentice.     Vision Additional Comments: Needs further assessment. Seems to have R sided preference but able to turn head to L spontaneously and does attend to L side at times.   Perception     Praxis      Pertinent Vitals/Pain Pain Assessment: No/denies pain     Hand Dominance Right   Extremity/Trunk Assessment Upper Extremity Assessment Upper Extremity Assessment: LUE deficits/detail LUE Deficits /  Details: grossly 3+/5. decreased grip strength. Impaired fine/gross motor coordination. Pt reports  sensation intact.   Lower Extremity Assessment Lower Extremity Assessment: LLE deficits/detail LLE Deficits / Details: weakness noted LLE, 3-/5 gross motions, 2/5 dorsiflexion LLE Coordination: decreased fine motor;decreased gross motor   Cervical / Trunk Assessment Cervical / Trunk Assessment: Normal   Communication Communication Communication: Expressive difficulties   Cognition Arousal/Alertness: Awake/alert Behavior During Therapy: Flat affect Overall Cognitive Status: Impaired/Different from baseline Area of Impairment: Following commands;Problem solving       Following Commands: Follows one step commands consistently;Follows one step commands with increased time     Problem Solving: Slow processing;Decreased initiation;Difficulty sequencing;Requires verbal cues;Requires tactile cues General Comments: Pt slow to initiate but able to follow one step commands. Pt perseverating on gross motor coordination testing.   General Comments       Exercises       Shoulder Instructions      Home Living Family/patient expects to be discharged to:: Private residence Living Arrangements: Spouse/significant other Available Help at Discharge: Family Type of Home: House Home Access: Stairs to enter Secretary/administratorntrance Stairs-Number of Steps: 2-3 Entrance Stairs-Rails: None Home Layout: One level     Bathroom Shower/Tub: Chief Strategy OfficerTub/shower unit   Bathroom Toilet: Standard     Home Equipment: Environmental consultantWalker - 2 wheels          Prior Functioning/Environment Level of Independence: Independent                 OT Problem List: Decreased strength;Decreased range of motion;Impaired balance (sitting and/or standing);Decreased coordination;Decreased cognition;Impaired vision/perception;Decreased safety awareness;Decreased knowledge of use of DME or AE;Decreased knowledge of precautions;Impaired UE functional use   OT Treatment/Interventions: Self-care/ADL training;Therapeutic exercise;Neuromuscular  education;Energy conservation;DME and/or AE instruction;Therapeutic activities;Visual/perceptual remediation/compensation;Cognitive remediation/compensation;Patient/family education;Balance training    OT Goals(Current goals can be found in the care plan section) Acute Rehab OT Goals Patient Stated Goal: to get back to indpendence OT Goal Formulation: With patient/family Time For Goal Achievement: 05/21/16 Potential to Achieve Goals: Good ADL Goals Pt Will Perform Grooming: with min guard assist;standing Pt Will Perform Upper Body Bathing: with supervision;sitting Pt Will Perform Lower Body Bathing: with min guard assist;sit to/from stand Pt Will Transfer to Toilet: with min assist;ambulating;bedside commode Pt/caregiver will Perform Home Exercise Program: Left upper extremity;Increased ROM;Increased strength;Independently;With written HEP provided  OT Frequency: Min 3X/week   Barriers to D/C:            Co-evaluation PT/OT/SLP Co-Evaluation/Treatment: Yes Reason for Co-Treatment: For patient/therapist safety;Complexity of the patient's impairments (multi-system involvement) PT goals addressed during session: Mobility/safety with mobility OT goals addressed during session: ADL's and self-care      End of Session Equipment Utilized During Treatment: Rolling walker;Oxygen Nurse Communication: Mobility status  Activity Tolerance: Patient tolerated treatment well Patient left: in bed;with call bell/phone within reach;with bed alarm set;with family/visitor present   Time: 1547-1610 OT Time Calculation (min): 23 min Charges:  OT General Charges $OT Visit: 1 Procedure OT Evaluation $OT Eval Moderate Complexity: 1 Procedure G-Codes:     Gaye AlkenBailey A Oviya Ammar M.S., OTR/L Pager: [phone]  05/07/2016, 4:35 PM

## 2016-05-07 NOTE — Progress Notes (Signed)
[facility].  Patient alert and oriented x 4. Still with slurred speech and left facial droop. Telemetry monitor applied. Call bell within reach. Report given to on-coming RN.

## 2016-05-07 NOTE — Evaluation (Signed)
Clinical/Bedside Swallow Evaluation Patient Details  Name: Chloe Owens J Shevchenko MRN: [id number] Date of Birth: 1956/02/04  Today's Date: 05/07/2016 Time: SLP Start Time (ACUTE ONLY): 1840 SLP Stop Time (ACUTE ONLY): 1855 SLP Time Calculation (min) (ACUTE ONLY): 15 min  Past Medical History:  Past Medical History:  Diagnosis Date  . Anxiety   . Bladder infection    hx of  . Depression   . Diabetes mellitus without complication (HCC)   . GERD (gastroesophageal reflux disease)   . Hyperlipidemia   . Hypertension   . Neuromuscular disorder (HCC)   . Stroke [facility](HCC)    05/2008   Past Surgical History:  Past Surgical History:  Procedure Laterality Date  . ANKLE CLOSED REDUCTION  04/15/2012   Procedure: CLOSED REDUCTION ANKLE;  Surgeon: Senaida LangeKevin M Supple, MD;  Location: [facility] OR;  Service: Orthopedics;  Laterality: Left;  . CARPAL TUNNEL RELEASE     bilaterally  . ORIF ANKLE FRACTURE  05/09/2012   Procedure: OPEN REDUCTION INTERNAL FIXATION (ORIF) ANKLE FRACTURE;  Surgeon: Toni ArthursJohn Hewitt, MD;  Location: [facility] OR;  Service: Orthopedics;  Laterality: Left;  ORIF VERSES EXFIX LEFT ANKLE FRACTURE   HPI:  60 year old lady with past medical history of hypertension, hyperlipidemia, diabetes mellitus, stroke, GERD, depression, who presents with left-sided weakness and left facial numbness. Acute RIGHT MCA lenticulostriate territory infarct.   Assessment / Plan / Recommendation Clinical Impression  Immediate and strong reflexive cough following thin via straw to clear solid. Suspect possible solid residue in combination with increased velocity with straw led to cough. Single cup sips thin were consumed without difficulty. Recommend Dys 3 texture, thin liquid, no straws and pills whole in applesauce. Will follow up to further determine safety with recommendations.      Aspiration Risk   (mild-mod)    Diet Recommendation Dysphagia 3 (Mech soft);Thin liquid   Liquid Administration via: Cup;No straw Medication  Administration: Whole meds with puree Supervision: Patient able to self feed;Intermittent supervision to cue for compensatory strategies Compensations: Slow rate;Small sips/bites Postural Changes: Seated upright at 90 degrees;Remain upright for at least 30 minutes after po intake    Other  Recommendations Oral Care Recommendations: Oral care BID   Follow up Recommendations None      Frequency and Duration min 1 x/week  1 week       Prognosis Prognosis for Safe Diet Advancement: Good      Swallow Study   General HPI: 60 year old lady with past medical history of hypertension, hyperlipidemia, diabetes mellitus, stroke, GERD, depression, who presents with left-sided weakness and left facial numbness. Acute RIGHT MCA lenticulostriate territory infarct. Type of Study: Bedside Swallow Evaluation Previous Swallow Assessment: none Diet Prior to this Study: NPO Temperature Spikes Noted: Yes Respiratory Status: Nasal cannula History of Recent Intubation: No Behavior/Cognition: Alert;Cooperative Oral Cavity Assessment: Within Functional Limits Oral Care Completed by SLP: No Oral Cavity - Dentition: Adequate natural dentition Vision: Functional for self-feeding Self-Feeding Abilities: Able to feed self;Needs assist;Needs set up Patient Positioning: Upright in bed Baseline Vocal Quality: Normal Volitional Cough: Strong Volitional Swallow: Able to elicit    Oral/Motor/Sensory Function Overall Oral Motor/Sensory Function:  (mild-mod) Facial ROM: Reduced left;Suspected CN VII (facial) dysfunction Facial Symmetry: Abnormal symmetry left Facial Strength: Suspected CN VII (facial) dysfunction Lingual ROM: Within Functional Limits Lingual Symmetry: Within Functional Limits   Ice Chips Ice chips: Not tested   Thin Liquid Thin Liquid: Impaired Presentation: Cup;Straw Pharyngeal  Phase Impairments: Cough - Immediate    Nectar Thick Nectar Thick Liquid:  Not tested   Honey Thick Honey Thick  Liquid: Not tested   Puree Puree: Within functional limits   Solid   GO   Solid: Impaired Pharyngeal Phase Impairments: Cough - Immediate (with thin following cracker)        Roque CashLitaker, Breck CoonsLisa Willis 05/07/2016,7:03 PM   Breck CoonsLisa Willis Lonell FaceLitaker M.Ed [employer]SLP Pager [phone]

## 2016-05-08 DIAGNOSIS — E0800 Diabetes mellitus due to underlying condition with hyperosmolarity without nonketotic hyperglycemic-hyperosmolar coma (NKHHC): Secondary | ICD-10-CM

## 2016-05-08 DIAGNOSIS — I69391 Dysphagia following cerebral infarction: Secondary | ICD-10-CM

## 2016-05-08 DIAGNOSIS — E1159 Type 2 diabetes mellitus with other circulatory complications: Secondary | ICD-10-CM

## 2016-05-08 DIAGNOSIS — I639 Cerebral infarction, unspecified: Secondary | ICD-10-CM

## 2016-05-08 DIAGNOSIS — I340 Nonrheumatic mitral (valve) insufficiency: Secondary | ICD-10-CM

## 2016-05-08 DIAGNOSIS — D72829 Elevated white blood cell count, unspecified: Secondary | ICD-10-CM

## 2016-05-08 DIAGNOSIS — I10 Essential (primary) hypertension: Secondary | ICD-10-CM

## 2016-05-08 DIAGNOSIS — F331 Major depressive disorder, recurrent, moderate: Secondary | ICD-10-CM

## 2016-05-08 DIAGNOSIS — F411 Generalized anxiety disorder: Secondary | ICD-10-CM

## 2016-05-08 DIAGNOSIS — Z720 Tobacco use: Secondary | ICD-10-CM

## 2016-05-08 DIAGNOSIS — I6930 Unspecified sequelae of cerebral infarction: Secondary | ICD-10-CM

## 2016-05-08 DIAGNOSIS — Z8673 Personal history of transient ischemic attack (TIA), and cerebral infarction without residual deficits: Secondary | ICD-10-CM

## 2016-05-08 LAB — TROPONIN I
Troponin I: <0.03 ng/mL (ref <0.03)
Troponin I: <0.03 ng/mL (ref <0.03)

## 2016-05-08 LAB — GLUCOSE, CAPILLARY
Glucose-Capillary: 42 mg/dL — CL (ref 65–99)
Glucose-Capillary: 261 mg/dL — ABNORMAL HIGH (ref 65–99)
Glucose-Capillary: 333 mg/dL — ABNORMAL HIGH (ref 65–99)
Glucose-Capillary: 104 mg/dL — ABNORMAL HIGH (ref 65–99)
Glucose-Capillary: 218 mg/dL — ABNORMAL HIGH (ref 65–99)

## 2016-05-08 LAB — RAPID URINE DRUG SCREEN, HOSP PERFORMED
Amphetamines: NOT DETECTED
Barbiturates: NOT DETECTED
Benzodiazepines: NOT DETECTED
Cocaine: NOT DETECTED
Opiates: NOT DETECTED
Tetrahydrocannabinol: NOT DETECTED

## 2016-05-08 LAB — URINALYSIS, ROUTINE W REFLEX MICROSCOPIC
Bilirubin Urine: NEGATIVE
Glucose, UA: >1000 mg/dL — AB
Ketones, ur: 15 mg/dL — AB
Nitrite: NEGATIVE
Protein, ur: 100 mg/dL — AB
Specific Gravity, Urine: 1.038 — ABNORMAL HIGH (ref 1.005–1.030)
pH: 5 (ref 5.0–8.0)

## 2016-05-08 LAB — URINE MICROSCOPIC-ADD ON: RBC / HPF: NONE SEEN RBC/hpf (ref 0–5)

## 2016-05-08 LAB — HEMOGLOBIN A1C
Hgb A1c MFr Bld: 8.2 % — ABNORMAL HIGH (ref 4.8–5.6)
Mean Plasma Glucose: 189 mg/dL

## 2016-05-08 MED ORDER — TRAMADOL HCL 50 MG PO TABS
50.0000 mg | ORAL_TABLET | Freq: Four times a day (QID) | ORAL | Status: DC | PRN
  Administered 2016-05-08 – 2016-05-10 (×3): 50 mg via ORAL
  Filled 2016-05-08 (×3): qty 1

## 2016-05-08 MED ORDER — DEXTROSE 50 % IV SOLN
1.0000 | Freq: Once | INTRAVENOUS | Status: AC
  Administered 2016-05-08: 50 mL via INTRAVENOUS

## 2016-05-08 MED ORDER — ACETAMINOPHEN 325 MG PO TABS
650.0000 mg | ORAL_TABLET | Freq: Four times a day (QID) | ORAL | Status: DC | PRN
  Administered 2016-05-09: 650 mg via ORAL
  Filled 2016-05-08: qty 2

## 2016-05-08 MED ORDER — DEXTROSE 50 % IV SOLN
INTRAVENOUS | Status: AC
  Administered 2016-05-08: 50 mL
  Filled 2016-05-08: qty 50

## 2016-05-08 MED ORDER — INSULIN GLARGINE 100 UNIT/ML ~~LOC~~ SOLN
18.0000 [IU] | Freq: Every day | SUBCUTANEOUS | Status: DC
  Administered 2016-05-08 – 2016-05-10 (×3): 18 [IU] via SUBCUTANEOUS
  Filled 2016-05-08 (×4): qty 0.18

## 2016-05-08 NOTE — Progress Notes (Signed)
Inpatient Rehabilitation  I met with the patient, her husband and her son at the bedside to discuss the recommendation for CIR.  Pt. and family would like to have pt. Come to CIR for intensive rehab.  I will follow along for medical readiness and bed availability for possible IP Rehab admission.  I expect limited bed availability for tomorrow. I updated Jacqualin Combes, RNCM.   Pt. and family aware.    [place] Admissions Coordinator Cell [phone] Office [phone]

## 2016-05-08 NOTE — Progress Notes (Signed)
Patient's blood sugar 42, patient asymptomatic. Patient alert and oriented, was given 4 ounces of orange juice and D 50. Will recheck blood sugar.

## 2016-05-08 NOTE — Progress Notes (Signed)
Speech Language Pathology Treatment: Dysphagia  Patient Details Name: Chloe Owens MRN: [id number] DOB: [birth year] Today's Date: 05/08/2016 Time: 1151-1209 SLP Time Calculation (min) (ACUTE ONLY): 18 min  Assessment / Plan / Recommendation Clinical Impression  Skilled clinical treatment during lunch with husband at bedside. Mild left buccal cavity pocketing and labial residue with decreased awareness; able to remove with cues. Mild verbal cues given for smaller bites and place food on right side oral cavity. No s/s aspiration with cup or with straw trials. Recommend continue Dys 3, thin, straws allowed, pills whole in applesauce. Continue ST for upgrade and carry over with strategies.    HPI HPI: 60 year old lady with past medical history of hypertension, hyperlipidemia, diabetes mellitus, stroke, GERD, depression, who presents with left-sided weakness and left facial numbness. Acute RIGHT MCA lenticulostriate territory infarct.      SLP Plan  Continue with current plan of care     Recommendations  Diet recommendations: Dysphagia 3 (mechanical soft);Thin liquid Liquids provided via: Cup;Straw Medication Administration: Whole meds with puree Supervision: Patient able to self feed;Intermittent supervision to cue for compensatory strategies Compensations: Slow rate;Small sips/bites;Lingual sweep for clearance of pocketing Postural Changes and/or Swallow Maneuvers: Seated upright 90 degrees                General recommendations: Rehab consult Oral Care Recommendations: Oral care BID Follow up Recommendations: Inpatient Rehab Plan: Continue with current plan of care       GO                [name], [name] 05/08/2016, 12:12 PM Chloe Owens M.Ed [employer] Pager [phone]

## 2016-05-08 NOTE — Progress Notes (Signed)
[facility] PROGRESS NOTE  Aldona Barerry J Strauser WUJ:[id number] DOB: 07/20/1955 DOA: 05/07/2016   PCP: Pcp Not In System     Assessment/Plan: Principal Problem:   Acute ischemic stroke [facility](HCC) Active Problems:   Hypotension   Diabetes mellitus (HCC)   Hyperlipidemia   History of CVA (cerebrovascular accident)   Depression   Generalized anxiety disorder  60 yo female with prior right sided CVA in 2009 with no residual deficits, HTN, DM2, GERD. Presented [facility] with symptoms of left-sided weakness that started around 6 PM last night. Symptoms are similar to her prior stroke, but with greater weakness in her legs and difficulty walking. Also has weakness in her left arm. Patient admitted for stroke workup   Assessment and plan  Acute RIGHT MCA lenticulostriate territory infarct - L sided and left facial weakness/History of stroke in 2009  [facility] showed subacute right internal capsule infarction. Other workup is pending. Telemetry shows normal sinus rhythm  -MRI/MRA brain   as above -MRI/MRA head  severe BILATERAL PCA disease Carotid ultrasound  pending Allow permissive HTN -Echo  pending -PT/OT/SLP  Dysphagia 3 (Mech soft);Thin liquid   lipid panel -107, continue statin  Check A1C Patient takes aspirin 325 mg at home, switch to Plavix? Anticoagulation with Lovenox -Neuro to consult  pending  Hypertension There were no vitals taken for this visit. Allow permisive HTN due to above,  Hold lisinopril,  Hyperlipidemia Continue home statins   Type II Diabetes Current blood sugar level is unknown at this time  Recent Labs       Lab Results  Component Value Date   HGBA1C 8.3 (H) 04/14/2012     Continue SSI, Lantus   Anxiety Continue home Xanax  Depression Continue home med with Lexapro and Depakote    GERD, no acute symptoms: Continue PPI  Tobacco abuse   -  Nicotine patch was offered but patient declined  -   Counseled cessation    DVT prophylaxsis  Lovenox  Code Status:  Full code      Family Communication: Discussed in detail with the patient and husband, all imaging results, lab results explained to the patient   Disposition Plan: 1-2 days      Consultants:  Neurology,   Procedures:  None  Antibiotics: Anti-infectives    None         HPI/Subject Hypoglycemic this am , expressive aphasia , left arm weakness    Objective: Vitals:   05/07/16 1721 05/07/16 2158 05/08/16 0152 05/08/16 0613  BP: (!) 172/80 135/75 (!) 132/56 128/87  Pulse:  93 84 90  Resp: 18 20 18 18   Temp: 99.5 F (37.5 C) 98.7 F (37.1 C) 98.3 F (36.8 C) 98.9 F (37.2 C)  TempSrc: Oral Oral Axillary Oral  SpO2: 98% 100% 99% 100%   No intake or output data in the 24 hours ending 05/08/16 0909  Exam:  Examination:  General exam: Appears calm and comfortable  Respiratory system: Clear to auscultation. Respiratory effort normal. Cardiovascular system: S1 & S2 heard, RRR. No JVD, murmurs, rubs, gallops or clicks. No pedal edema. Gastrointestinal system: Abdomen is nondistended, soft and nontender. No organomegaly or masses felt. Normal bowel sounds heard. Central nervous system: expressive aphasia , left arm weakness  . Extremities: Symmetric 5 x 5 power. Skin: No rashes, lesions or ulcers Psychiatry: Judgement and insight appear normal. Mood & affect appropriate.     Data Reviewed: I have personally reviewed  following labs and imaging studies  Micro Results No results found for this or any previous visit (from the past 240 hour(s)).  Radiology Reports Mr Brain Wo Contrast  Result Date: 05/07/2016 CLINICAL DATA:  LEFT-sided weakness began 05/06/2016. Stroke risk factors include diabetes, hypertension, and previous cerebral infarction. EXAM: MRI HEAD WITHOUT CONTRAST MRA HEAD WITHOUT CONTRAST TECHNIQUE: Multiplanar, multiecho pulse sequences of the brain and surrounding structures  were obtained without intravenous contrast. Angiographic images of the head were obtained using MRA technique without contrast. COMPARISON:  CT head 05/07/2016.  MR brain 08/05/2013. FINDINGS: MRI HEAD FINDINGS Brain: Ovoid area of marked restricted diffusion, 5 x 12 mm cross-section, affecting the posterior lentiform nucleus and posterior limb internal capsule consistent with acute infarction. T2 shine through surrounds an area of previous centrum semiovale lacunar infarction with surrounding gliosis. No hemorrhage, mass lesion, hydrocephalus, or extra-axial fluid. Premature for age cerebral and cerebellar atrophy. Extensive T2 and FLAIR hyperintensity throughout the white matter consistent with small vessel disease. Vascular: Flow voids are maintained throughout the carotid, basilar, and vertebral arteries. There are no areas of chronic hemorrhage. Skull and upper cervical spine: Unremarkable visualized calvarium, skullbase, and cervical vertebrae. Pituitary, pineal, cerebellar tonsils unremarkable. Mild cervical stenosis due to spondylosis at C5-6. Sinuses/Orbits: Conjugate gaze to the RIGHT. Mild chronic sinus disease. Other: None. MRA HEAD FINDINGS The internal carotid arteries are widely patent. The basilar artery is widely patent with vertebrals codominant. There is no intracranial stenosis or aneurysm. The anterior cerebral arteries and middle cerebral arteries are widely patent. Both posterior cerebral arteries are diseased. There is apparent occlusion of the LEFT PCA with no distal flow related enhancement at its P1/P2 junction. On the RIGHT, the P2 PCA is severely diseased with segmental areas of narrowing or near total occlusion. No definite cerebellar branch flow-limiting stenosis or occlusion is seen. IMPRESSION: Acute RIGHT MCA lenticulostriate territory infarct affecting the lentiform nucleus and posterior limb internal capsule correlates with the CT abnormality. This is nonhemorrhagic. Atrophy and  small vessel disease of an advanced nature. No anterior circulation stenosis or occlusion. 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 Electronically Signed   By: Elsie Stain M.D.   On: 05/07/2016 12:17   Mr Chloe Owens Head/brain NW Cm  Result Date: 05/07/2016 CLINICAL DATA:  LEFT-sided weakness began 05/06/2016. Stroke risk factors include diabetes, hypertension, and previous cerebral infarction. EXAM: MRI HEAD WITHOUT CONTRAST MRA HEAD WITHOUT CONTRAST TECHNIQUE: Multiplanar, multiecho pulse sequences of the brain and surrounding structures were obtained without intravenous contrast. Angiographic images of the head were obtained using MRA technique without contrast. COMPARISON:  CT head 05/07/2016.  MR brain 08/05/2013. FINDINGS: MRI HEAD FINDINGS Brain: Ovoid area of marked restricted diffusion, 5 x 12 mm cross-section, affecting the posterior lentiform nucleus and posterior limb internal capsule consistent with acute infarction. T2 shine through surrounds an area of previous centrum semiovale lacunar infarction with surrounding gliosis. No hemorrhage, mass lesion, hydrocephalus, or extra-axial fluid. Premature for age cerebral and cerebellar atrophy. Extensive T2 and FLAIR hyperintensity throughout the white matter consistent with small vessel disease. Vascular: Flow voids are maintained throughout the carotid, basilar, and vertebral arteries. There are no areas of chronic hemorrhage. Skull and upper cervical spine: Unremarkable visualized calvarium, skullbase, and cervical vertebrae. Pituitary, pineal, cerebellar tonsils unremarkable. Mild cervical stenosis due to spondylosis at C5-6. Sinuses/Orbits: Conjugate gaze to the RIGHT. Mild chronic sinus disease. Other: None. MRA HEAD FINDINGS The internal carotid arteries are widely patent. The basilar artery is widely patent with  vertebrals codominant. There is no intracranial stenosis or aneurysm. The anterior cerebral arteries and middle cerebral  arteries are widely patent. Both posterior cerebral arteries are diseased. There is apparent occlusion of the LEFT PCA with no distal flow related enhancement at its P1/P2 junction. On the RIGHT, the P2 PCA is severely diseased with segmental areas of narrowing or near total occlusion. No definite cerebellar branch flow-limiting stenosis or occlusion is seen. IMPRESSION: Acute RIGHT MCA lenticulostriate territory infarct affecting the lentiform nucleus and posterior limb internal capsule correlates with the CT abnormality. This is nonhemorrhagic. 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 No anterior circulation stenosis or occlusion. 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 Electronically Signed   By: Elsie StainJohn T Curnes M.D.   On: 05/07/2016 12:17     CBC  Recent Labs Lab 05/07/16 0809  WBC 14.1*  HGB 13.4  HCT 37.9  PLT 236  MCV 84.8  MCH 30.0  MCHC 35.4  RDW 12.3    Chemistries   Recent Labs Lab 05/07/16 0809  NA 140  K 3.5  CL 106  CO2 25  GLUCOSE 185*  BUN 12  CREATININE 0.89  CALCIUM 9.5  AST 15  ALT 12*  ALKPHOS 74  BILITOT 0.5   ------------------------------------------------------------------------------------------------------------------ CrCl cannot be calculated (Unknown ideal weight.). ------------------------------------------------------------------------------------------------------------------ No results for input(s): HGBA1C in the last 72 hours. ------------------------------------------------------------------------------------------------------------------  Recent Labs  05/07/16 0809  CHOL 181  HDL 59  LDLCALC 107*  TRIG 74  CHOLHDL 3.1   ------------------------------------------------------------------------------------------------------------------ No results for input(s): TSH, T4TOTAL, T3FREE, THYROIDAB in the last 72 hours.  Invalid input(s):  FREET3 ------------------------------------------------------------------------------------------------------------------ No results for input(s): VITAMINB12, FOLATE, FERRITIN, TIBC, IRON, RETICCTPCT in the last 72 hours.  Coagulation profile No results for input(s): INR, PROTIME in the last 168 hours.  No results for input(s): DDIMER in the last 72 hours.  Cardiac Enzymes No results for input(s): CKMB, TROPONINI, MYOGLOBIN in the last 168 hours.  Invalid input(s): CK ------------------------------------------------------------------------------------------------------------------ Invalid input(s): POCBNP   CBG:  Recent Labs Lab 05/07/16 1323 05/07/16 1616 05/07/16 2127 05/08/16 0655 05/08/16 0737  GLUCAP 184* 199* 209* 42* 261*       Studies: Mr Brain Wo Contrast  Result Date: 05/07/2016 CLINICAL DATA:  LEFT-sided weakness began 05/06/2016. Stroke risk factors include diabetes, hypertension, and previous cerebral infarction. EXAM: MRI HEAD WITHOUT CONTRAST MRA HEAD WITHOUT CONTRAST TECHNIQUE: Multiplanar, multiecho pulse sequences of the brain and surrounding structures were obtained without intravenous contrast. Angiographic images of the head were obtained using MRA technique without contrast. COMPARISON:  CT head 05/07/2016.  MR brain 08/05/2013. FINDINGS: MRI HEAD FINDINGS Brain: Ovoid area of marked restricted diffusion, 5 x 12 mm cross-section, affecting the posterior lentiform nucleus and posterior limb internal capsule consistent with acute infarction. T2 shine through surrounds an area of previous centrum semiovale lacunar infarction with surrounding gliosis. No hemorrhage, mass lesion, hydrocephalus, or extra-axial fluid. Premature for age cerebral and cerebellar atrophy. Extensive T2 and FLAIR hyperintensity throughout the white matter consistent with small vessel disease. Vascular: Flow voids are maintained throughout the carotid, basilar, and vertebral arteries. There  are no areas of chronic hemorrhage. Skull and upper cervical spine: Unremarkable visualized calvarium, skullbase, and cervical vertebrae. Pituitary, pineal, cerebellar tonsils unremarkable. Mild cervical stenosis due to spondylosis at C5-6. Sinuses/Orbits: Conjugate gaze to the RIGHT. Mild chronic sinus disease. Other: None. MRA HEAD FINDINGS The internal carotid arteries are widely patent. The basilar artery is widely patent with vertebrals codominant. There is no  intracranial stenosis or aneurysm. The anterior cerebral arteries and middle cerebral arteries are widely patent. Both posterior cerebral arteries are diseased. There is apparent occlusion of the LEFT PCA with no distal flow related enhancement at its P1/P2 junction. On the RIGHT, the P2 PCA is severely diseased with segmental areas of narrowing or near total occlusion. No definite cerebellar branch flow-limiting stenosis or occlusion is seen. IMPRESSION: Acute RIGHT MCA lenticulostriate territory infarct affecting the lentiform nucleus and posterior limb internal capsule correlates with the CT abnormality. This is nonhemorrhagic. 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 No anterior circulation stenosis or occlusion. 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 Electronically Signed   By: Elsie Stain M.D.   On: 05/07/2016 12:17   Mr Chloe Owens Head/brain BM Cm  Result Date: 05/07/2016 CLINICAL DATA:  LEFT-sided weakness began 05/06/2016. Stroke risk factors include diabetes, hypertension, and previous cerebral infarction. EXAM: MRI HEAD WITHOUT CONTRAST MRA HEAD WITHOUT CONTRAST TECHNIQUE: Multiplanar, multiecho pulse sequences of the brain and surrounding structures were obtained without intravenous contrast. Angiographic images of the head were obtained using MRA technique without contrast. COMPARISON:  CT head 05/07/2016.  MR brain 08/05/2013. FINDINGS: MRI HEAD FINDINGS Brain: Ovoid area of marked restricted diffusion, 5  x 12 mm cross-section, affecting the posterior lentiform nucleus and posterior limb internal capsule consistent with acute infarction. T2 shine through surrounds an area of previous centrum semiovale lacunar infarction with surrounding gliosis. No hemorrhage, mass lesion, hydrocephalus, or extra-axial fluid. Premature for age cerebral and cerebellar atrophy. Extensive T2 and FLAIR hyperintensity throughout the white matter consistent with small vessel disease. Vascular: Flow voids are maintained throughout the carotid, basilar, and vertebral arteries. There are no areas of chronic hemorrhage. Skull and upper cervical spine: Unremarkable visualized calvarium, skullbase, and cervical vertebrae. Pituitary, pineal, cerebellar tonsils unremarkable. Mild cervical stenosis due to spondylosis at C5-6. Sinuses/Orbits: Conjugate gaze to the RIGHT. Mild chronic sinus disease. Other: None. MRA HEAD FINDINGS The internal carotid arteries are widely patent. The basilar artery is widely patent with vertebrals codominant. There is no intracranial stenosis or aneurysm. The anterior cerebral arteries and middle cerebral arteries are widely patent. Both posterior cerebral arteries are diseased. There is apparent occlusion of the LEFT PCA with no distal flow related enhancement at its P1/P2 junction. On the RIGHT, the P2 PCA is severely diseased with segmental areas of narrowing or near total occlusion. No definite cerebellar branch flow-limiting stenosis or occlusion is seen. IMPRESSION: Acute RIGHT MCA lenticulostriate territory infarct affecting the lentiform nucleus and posterior limb internal capsule correlates with the CT abnormality. This is nonhemorrhagic. 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 No anterior circulation stenosis or occlusion. 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 Electronically Signed   By: Elsie Stain M.D.   On: 05/07/2016 12:17      Lab Results  Component Value  Date   HGBA1C 8.3 (H) 04/14/2012   HGBA1C (H) 05/25/2008    9.8 (NOTE)   The ADA recommends the following therapeutic goal for glycemic   control related to Hgb A1C measurement:   Goal of Therapy:   < 7.0% Hgb A1C   Reference: American Diabetes Association: Clinical Practice   Recommendations 2008, Diabetes Care,  2008, 31:(Suppl 1).   Lab Results  Component Value Date   LDLCALC 107 (H) 05/07/2016   CREATININE 0.89 05/07/2016       Scheduled Meds: .  stroke: mapping our early stages of recovery book  Does not apply Once  . aspirin  325 mg Oral Daily  . atorvastatin  40 mg Oral q1800  . clopidogrel  75 mg Oral Daily  . enoxaparin (LOVENOX) injection  40 mg Subcutaneous Q24H  . insulin aspart  0-15 Units Subcutaneous TID WC  . insulin aspart  0-5 Units Subcutaneous QHS  . insulin glargine  18 Units Subcutaneous Daily   Continuous Infusions:   LOS: 1 day    Time spent: >30 MINS    [facility]  [facility] Pager [phone]. If 7PM-7AM, please contact night-coverage at [email or web address], password [facility] 05/08/2016, 9:09 AM  LOS: 1 day

## 2016-05-08 NOTE — Progress Notes (Signed)
Physical Therapy Treatment Patient Details Name: Chloe Owens MRN: [id number] DOB: Oct 12, 1955 Today's Date: 05/08/2016    History of Present Illness 60 year old lady with past medical history of hypertension, hyperlipidemia, diabetes mellitus, stroke, GERD, depression, who presents with left-sided weakness and left facial numbness. Acute RIGHT MCA lenticulostriate territory infarct.    PT Comments    Patient able to follow commands (although slow processing) and make adjustments/changes as instructed. She has decr overall attention and moderate inattention to left side. Able to tolerate up to 10 minutes on her feet despite reported nausea on PT's arrival.   Follow Up Recommendations  CIR     Equipment Recommendations  Other (comment) (TBD)    Recommendations for Other Services       Precautions / Restrictions Precautions Precautions: Fall Restrictions Weight Bearing Restrictions: No    Mobility  Bed Mobility Overal bed mobility: Needs Assistance Bed Mobility: Supine to Sit     Supine to sit: Min assist     General bed mobility comments: attempted to cue pt in technique, however she was determined to pull up from supine to sit at Rt side of bed; incr effort & time; LUE dragging behind her torso  Transfers Overall transfer level: Needs assistance Equipment used: Rolling walker (2 wheeled) Transfers: Sit to/from [employer]Stand;Stand Pivot Transfers Sit to Stand: Min assist Stand pivot transfers: Mod assist       General transfer comment: to stand from EOB x 2; step pivot to her rt to chair (incr assist to shft over RLE to allow advancing LLE  Ambulation/Gait     Assistive device: Rolling walker (2 wheeled)       General Gait Details: pre-gait at EOB x 10 minutes (one seated rest); weight shifting to rt to unweight LLE, finding the middle and maintaining; stepping fwd/back; marching (lift LLE); steps backwards; heel raises, toe raises   Stairs             Wheelchair Mobility    Modified Rankin (Stroke Patients Only) Modified Rankin (Stroke Patients Only) Pre-Morbid Rankin Score: No symptoms Modified Rankin: Moderately severe disability     Balance     Sitting balance-Leahy Scale: Fair     Standing balance support: Bilateral upper extremity supported Standing balance-Leahy Scale: Poor Standing balance comment: leans to her left; can identify she is left of center, but requires assist to correct                    Cognition Arousal/Alertness: Awake/alert Behavior During Therapy: Flat affect Overall Cognitive Status: Impaired/Different from baseline Area of Impairment: Following commands;Problem solving;Attention   Current Attention Level: Sustained (Inattention to LUE)   Following Commands: Follows one step commands consistently;Follows one step commands with increased time     Problem Solving: Slow processing;Decreased initiation;Difficulty sequencing;Requires verbal cues;Requires tactile cues General Comments: remains slow to initiate; perseverated on heel raising exercise    Exercises Other Exercises Other Exercises: Pt initially nauseated with RN providing IV meds on arrival. Began with PROM-AAROM Left extremities (UE and LE)    General Comments General comments (skin integrity, edema, etc.): husband present      Pertinent Vitals/Pain Pain Assessment: No/denies pain    Home Living                      Prior Function            PT Goals (current goals can now be found in the care plan section) Acute Rehab PT  Goals Patient Stated Goal: to get back to indpendence Time For Goal Achievement: 05/21/16 Progress towards PT goals: Progressing toward goals    Frequency    Min 4X/week      PT Plan Current plan remains appropriate    Co-evaluation             End of Session Equipment Utilized During Treatment: Gait belt;Oxygen Activity Tolerance: Patient tolerated treatment  well Patient left: with call bell/phone within reach;with family/visitor present;in chair;with chair alarm set     Time: [id number] PT Time Calculation (min) (ACUTE ONLY): 39 min  Charges:  $Therapeutic Activity: 23-37 mins                    G CodesScherrie November:      Nyjae Hodge P Jatavis Malek 05/08/2016, 5:35 PM Pager [phone]

## 2016-05-08 NOTE — Care Management Note (Signed)
Case Management Note  Patient Details  Name: Chloe Owens MRN: [id number] Date of Birth: Nov 26, 1955  Subjective/Objective:    Pt admitted with CVA. She is from home with her spouse.                 Action/Plan: CIR is recommendations from PT/OT. CM following for discharge disposition.   Expected Discharge Date:                  Expected Discharge Plan:  IP Rehab Facility  In-House Referral:     Discharge planning Services     Post Acute Care Choice:    Choice offered to:     DME Arranged:    DME Agency:     HH Arranged:    HH Agency:     Status of Service:  In process, will continue to follow  If discussed at Long Length of Stay Meetings, dates discussed:    Additional Comments:  Kermit BaloKelli F Rondey Fallen, RN 05/08/2016, 1:31 PM

## 2016-05-08 NOTE — Evaluation (Signed)
Speech Language Pathology Evaluation Patient Details Name: Chloe Owens MRN: [id number] DOB: July 27, 1955 Today's Date: 05/08/2016 Time: 1151-1209 SLP Time Calculation (min) (ACUTE ONLY): 18 min  Problem List:  Patient Active Problem List   Diagnosis Date Noted  . Acute ischemic stroke (HCC) 05/07/2016  . Depression 05/07/2016  . Generalized anxiety disorder 05/07/2016  . Ankle fracture, left 04/14/2012  . Fall due to stumbling 04/14/2012  . Hypoglycemia 04/14/2012  . Hypotension 04/14/2012  . Diabetes mellitus (HCC) 04/14/2012  . Hyperlipidemia 04/14/2012  . History of CVA (cerebrovascular accident) 04/14/2012   Past Medical History:  Past Medical History:  Diagnosis Date  . Anxiety   . Bladder infection    hx of  . Depression   . Diabetes mellitus without complication (HCC)   . GERD (gastroesophageal reflux disease)   . Hyperlipidemia   . Hypertension   . Neuromuscular disorder (HCC)   . Stroke [facility](HCC)    05/2008   Past Surgical History:  Past Surgical History:  Procedure Laterality Date  . ANKLE CLOSED REDUCTION  04/15/2012   Procedure: CLOSED REDUCTION ANKLE;  Surgeon: Senaida LangeKevin M Supple, MD;  Location: [facility] OR;  Service: Orthopedics;  Laterality: Left;  . CARPAL TUNNEL RELEASE     bilaterally  . ORIF ANKLE FRACTURE  05/09/2012   Procedure: OPEN REDUCTION INTERNAL FIXATION (ORIF) ANKLE FRACTURE;  Surgeon: Toni ArthursJohn Hewitt, MD;  Location: [facility] OR;  Service: Orthopedics;  Laterality: Left;  ORIF VERSES EXFIX LEFT ANKLE FRACTURE   HPI:  60 year old lady with past medical history of hypertension, hyperlipidemia, diabetes mellitus, stroke, GERD, depression, who presents with left-sided weakness and left facial numbness. Acute RIGHT MCA lenticulostriate territory infarct.   Assessment / Plan / Recommendation Clinical Impression  Pt and husband report baseline impairments following stroke in 2009. [facility]. Husband present for assessment and  reports pt performed about the same as she would have prior to current stroke. PT/OT recommending CIR. SLP will defer treatment to next level of care for higher level abilities and determine pt resopnsibilities at home and level of assist needed.       SLP Assessment  All further Speech Lanaguage Pathology  needs can be addressed in the next venue of care    Follow Up Recommendations  Inpatient Rehab    Frequency and Duration           SLP Evaluation Cognition  Overall Cognitive Status: History of cognitive impairments - at baseline (poor memory per pt/spouse after 2009 stroke) Arousal/Alertness: Awake/alert Orientation Level: Oriented X4 Attention: Sustained Sustained Attention: Appears intact Memory: Impaired Memory Impairment: Storage deficit;Retrieval deficit;Decreased recall of new information (1/5 on MOCA) Awareness:  (? baseline vs new) Problem Solving: Impaired Problem Solving Impairment: Verbal complex Behaviors:  (flat affect) Safety/Judgment: Impaired       Comprehension  Auditory Comprehension Overall Auditory Comprehension: Appears within functional limits for tasks assessed Visual Recognition/Discrimination Discrimination: Not tested Reading Comprehension Reading Status: Within funtional limits    Expression Expression Primary Mode of Expression: Verbal Verbal Expression Overall Verbal Expression: Appears within functional limits for tasks assessed Initiation: No impairment Level of Generative/Spontaneous Verbalization: Sentence Repetition: Impaired Level of Impairment: Sentence level Naming: Not tested Pragmatics: Impairment Impairments: Abnormal affect;Monotone Written Expression Dominant Hand: Right Written Expression: Not tested   Oral / Motor  Oral Motor/Sensory Function Overall Oral Motor/Sensory Function: Mild impairment Facial ROM: Reduced left;Suspected CN VII (facial) dysfunction Facial Symmetry: Abnormal symmetry left Facial Strength:  Suspected CN VII (facial)  dysfunction Facial Sensation: Reduced left;Suspected CN V (Trigeminal) dysfunction Lingual ROM: Within Functional Limits Lingual Symmetry: Within Functional Limits Velum: Within Functional Limits Mandible: Within Functional Limits Motor Speech Overall Motor Speech: Appears within functional limits for tasks assessed Respiration: Within functional limits Phonation: Low vocal intensity Resonance: Within functional limits Articulation: Within functional limitis Intelligibility: Intelligible Motor Planning: Witnin functional limits   GO                    [name], Sirius Woodford Willis 05/08/2016, 12:37 PM  Breck CoonsLisa Willis Gaylon Melchor M.Ed [employer]-SLP Pager [phone]

## 2016-05-08 NOTE — Consult Note (Signed)
Physical Medicine and Rehabilitation Consult   Reason for Consult: Left sided weakness, slurred speech and difficulty walking.  Referring Physician: Dr. Susie CassetteAbrol   HPI: Chloe Owens is a 60 y.o. RH-female with history of HTN, T2DM, BUE tremor, depression, prior stroke with residual memory deficits who was admitted on 05/07/16 with slurred speech, difficulty walking and left sided weakness since evening prior to admission. MRI/MRA  brain done revealing acute RIGHT MCA lenticulostriate territory infarct affecting the lentiform nucleus and posterior limb internal capsule, advanced atrophy and SVD, severe bilateral PCA disease. 2D echo with EF 65-70%, moderate LVH, aortic sclerosis without stenosis and heavily calcified MV with mild regurgitation could be source of embolic stroke. Carotid dopplers without significant ICA stenosis. 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  PT/OT evaluations done revealing LUE weakness with poor coordination, balance deficits, question of visual deficits and patient requires cues for sequencing with tasks. CIR recommended for follow up therapy.   Sedentary PTA--she does a little housework but watches TV most of the day.  Husband manages finances, medications and does the driving. They eat out most evenings.  He works near by and can check in during the day.   Review of Systems  HENT: Negative for hearing loss.   Eyes: Negative for blurred vision and double vision.  Respiratory: Negative for cough and shortness of breath.   Cardiovascular: Negative for chest pain and palpitations.  Gastrointestinal: Positive for heartburn. Negative for nausea and vomiting.  Genitourinary: Positive for urgency (gets up 1-2 times at night. ).  Musculoskeletal: Negative for back pain, myalgias and neck pain.  Skin: Negative for rash.  Neurological: Positive for dizziness, sensory change, speech change, focal weakness and  weakness. Negative for headaches.  Psychiatric/Behavioral: Positive for memory loss. The patient has insomnia.   All other systems reviewed and are negative.     Past Medical History:  Diagnosis Date  . Anxiety   . Bladder infection    hx of  . Depression   . Diabetes mellitus without complication (HCC)   . GERD (gastroesophageal reflux disease)   . Hyperlipidemia   . Hypertension   . Neuromuscular disorder (HCC)   . Stroke [facility](HCC)    05/2008    Past Surgical History:  Procedure Laterality Date  . ANKLE CLOSED REDUCTION  04/15/2012   Procedure: CLOSED REDUCTION ANKLE;  Surgeon: Senaida LangeKevin M Supple, MD;  Location: [facility] OR;  Service: Orthopedics;  Laterality: Left;  . CARPAL TUNNEL RELEASE     bilaterally  . ORIF ANKLE FRACTURE  05/09/2012   Procedure: OPEN REDUCTION INTERNAL FIXATION (ORIF) ANKLE FRACTURE;  Surgeon: Toni ArthursJohn Hewitt, MD;  Location: [facility] OR;  Service: Orthopedics;  Laterality: Left;  ORIF VERSES EXFIX LEFT ANKLE FRACTURE    Family History  Problem Relation Age of Onset  . CAD Father   . CAD Mother   . Cancer - Other Mother     Breast  . Cancer - Other Brother     Unknown type    Social History:  Married. 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  Reports that she has been smoking Cigarettes.  She has been smoking about 1.5 pack per day. She has never used smokeless tobacco. She reports that she does not drink alcohol or use drugs.    Allergies  Allergen Reactions  . Codeine Nausea And Vomiting and Rash  . Penicillins Rash  Medications Prior to Admission  Medication Sig Dispense Refill  . ARIPiprazole (ABILIFY) 30 MG tablet Take 30 mg by mouth 2 (two) times daily.    Marland Kitchen aspirin EC 325 MG tablet Take 1 tablet (325 mg total) by mouth daily. 42 tablet 0  . buPROPion (WELLBUTRIN SR) 150 MG 12 hr tablet Take 150 mg by mouth 2 (two) times daily.    . clindamycin (CLEOCIN) 150 MG capsule Take 150 mg by mouth 3 (three) times daily.    .  clonazePAM (KLONOPIN) 1 MG tablet Take 1 mg by mouth 2 (two) times daily.     . Insulin Glargine (BASAGLAR KWIKPEN) 100 UNIT/ML SOPN Inject 45 Units into the skin every morning.    Marland Kitchen JANUVIA 100 MG tablet Take 100 mg by mouth every evening.    Marland Kitchen lisinopril (PRINIVIL,ZESTRIL) 20 MG tablet Take 20 mg by mouth daily.    Marland Kitchen omeprazole (PRILOSEC) 20 MG capsule Take 20 mg by mouth 2 (two) times daily.     . simvastatin (ZOCOR) 40 MG tablet Take 40 mg by mouth every evening.    . ondansetron (ZOFRAN ODT) 4 MG disintegrating tablet Take 1 tablet (4 mg total) by mouth every 4 (four) hours as needed for nausea or vomiting. (Patient not taking: Reported on 05/07/2016) 20 tablet 0  . ondansetron (ZOFRAN) 4 MG tablet Take 1 tablet (4 mg total) by mouth every 6 (six) hours as needed for nausea. (Patient not taking: Reported on 05/07/2016) 20 tablet 0  . oxyCODONE (OXY IR/ROXICODONE) 5 MG immediate release tablet Take 1-2 tablets (5-10 mg total) by mouth every 3 (three) hours as needed. (Patient not taking: Reported on 05/07/2016) 50 tablet 0    Home: Home Living Family/patient expects to be discharged to:: Private residence Living Arrangements: Spouse/significant other Available Help at Discharge: Family Type of Home: House Home Access: Stairs to enter Secretary/administrator of Steps: 2-3 Entrance Stairs-Rails: None Home Layout: One level Bathroom Shower/Tub: Engineer, manufacturing systems: Standard Home Equipment: Environmental consultant - 2 wheels  Functional History: Prior Function Level of Independence: Independent Functional Status:  Mobility: Bed Mobility Overal bed mobility: Needs Assistance Bed Mobility: Rolling, Sidelying to Sit, Sit to Sidelying Rolling: Min assist Sidelying to sit: Mod assist Sit to sidelying: Min assist General bed mobility comments: Assist to initiate RUE for rolling to L side. Assist for trunk elevation with sidelying to sit. Light min assist for LEs back to bed. Cues throughout for  sequencing and technique. Transfers Overall transfer level: Needs assistance Equipment used: Rolling walker (2 wheeled) Transfers: Sit to/from Stand Sit to Stand: Min assist, +2 physical assistance General transfer comment: Min assist +2 for sit to stand from EOB. Cues for hand placement and technique. Ambulation/Gait Ambulation/Gait assistance: Min assist, +2 physical assistance (moderate assist for faciliatation techniques) Ambulation Distance (Feet): 4 Feet Assistive device: Rolling walker (2 wheeled) Gait Pattern/deviations: Step-through pattern, Decreased stride length, Decreased dorsiflexion - left, Decreased weight shift to right, Ataxic General Gait Details: patient required faciliatation of weight shift, and multi modal cues for step and placement of LLE. Noted impaired coordination  Gait velocity: decreased Gait velocity interpretation: <1.8 ft/sec, indicative of risk for recurrent falls    ADL: ADL Overall ADL's : Needs assistance/impaired Eating/Feeding: NPO Grooming: Minimal assistance, Sitting Upper Body Bathing: Moderate assistance, Sitting Lower Body Bathing: Maximal assistance, Sit to/from stand Upper Body Dressing : Moderate assistance, Sitting Upper Body Dressing Details (indicate cue type and reason): to don/doff hospital gown Lower Body Dressing: Maximal assistance,  Sit to/from stand Toilet Transfer: Minimal assistance, +2 for physical assistance, Stand-pivot Toilet Transfer Details (indicate cue type and reason): Simulated by sit to stand from EOB with few steps. Functional mobility during ADLs: Minimal assistance, +2 for physical assistance, Rolling walker (for few steps only) General ADL Comments: Discussed need for post acute rehab with pt and family; they are agreeable. Encouraged movement and functional use of LUE. SpO2 down to 90% on RA with activity; reapplied 2L O2/Kelly.  Cognition: Cognition Overall Cognitive Status: Impaired/Different from  baseline Orientation Level: Oriented X4 Cognition Arousal/Alertness: Awake/alert Behavior During Therapy: Flat affect Overall Cognitive Status: Impaired/Different from baseline Area of Impairment: Following commands, Problem solving Following Commands: Follows one step commands consistently, Follows one step commands with increased time Problem Solving: Slow processing, Decreased initiation, Difficulty sequencing, Requires verbal cues, Requires tactile cues General Comments: Pt slow to initiate but able to follow one step commands. Pt perseverating on gross motor coordination testing.   Blood pressure 128/87, pulse 90, temperature 98.9 F (37.2 C), temperature source Oral, resp. rate 18, SpO2 100 %. Physical Exam  Nursing note and vitals reviewed. Constitutional: She is oriented to person, place, and time. She appears well-developed and well-nourished. No distress. Nasal cannula in place.  HENT:  Head: Normocephalic and atraumatic.  Mouth/Throat: Oropharynx is clear and moist.  Eyes: Conjunctivae and EOM are normal. Pupils are equal, round, and reactive to light.  Neck: Normal range of motion. Neck supple.  Cardiovascular: Normal rate and regular rhythm.   Murmur heard. Respiratory: Effort normal and breath sounds normal. No stridor. No respiratory distress.  +[place]  GI: Soft. Bowel sounds are normal. She exhibits no distension. There is no tenderness.  Musculoskeletal: She exhibits no edema or tenderness.  Neurological: She is alert and oriented to person, place, and time.  Left facial weakness with ataxic and mildly dysarthric speech.  Able to follow simple one and two step commands but had difficulty answering biographic questions.  Intentional tremor RUE.  Motor: RUE/RLE: 4+/5 proximal to distal LUE/LLE: 4-/5 proximal to distal DTRs symmetric Sensation intact to light touch  Skin: Skin is warm and dry.  Psychiatric: Her affect is blunt. She is slowed. Cognition and memory are  impaired.    Results for orders placed or performed during the hospital encounter of 05/07/16 (from the past 24 hour(s))  Glucose, capillary     Status: Abnormal   Collection Time: 05/07/16  1:23 PM  Result Value Ref Range   Glucose-Capillary 184 (H) 65 - 99 mg/dL   Comment 1 Notify RN    Comment 2 Document in Chart   Glucose, capillary     Status: Abnormal   Collection Time: 05/07/16  4:16 PM  Result Value Ref Range   Glucose-Capillary 199 (H) 65 - 99 mg/dL  Glucose, capillary     Status: Abnormal   Collection Time: 05/07/16  9:27 PM  Result Value Ref Range   Glucose-Capillary 209 (H) 65 - 99 mg/dL  Glucose, capillary     Status: Abnormal   Collection Time: 05/08/16  6:55 AM  Result Value Ref Range   Glucose-Capillary 42 (LL) 65 - 99 mg/dL   Comment 1 Notify RN    Comment 2 Document in Chart   Glucose, capillary     Status: Abnormal   Collection Time: 05/08/16  7:37 AM  Result Value Ref Range   Glucose-Capillary 261 (H) 65 - 99 mg/dL   Mr Brain Wo Contrast  Result Date: 05/07/2016 CLINICAL DATA:  LEFT-sided weakness  began 05/06/2016. Stroke risk factors include diabetes, hypertension, and previous cerebral infarction. EXAM: MRI HEAD WITHOUT CONTRAST MRA HEAD WITHOUT CONTRAST TECHNIQUE: Multiplanar, multiecho pulse sequences of the brain and surrounding structures were obtained without intravenous contrast. Angiographic images of the head were obtained using MRA technique without contrast. COMPARISON:  CT head 05/07/2016.  MR brain 08/05/2013. FINDINGS: MRI HEAD FINDINGS Brain: Ovoid area of marked restricted diffusion, 5 x 12 mm cross-section, affecting the posterior lentiform nucleus and posterior limb internal capsule consistent with acute infarction. T2 shine through surrounds an area of previous centrum semiovale lacunar infarction with surrounding gliosis. No hemorrhage, mass lesion, hydrocephalus, or extra-axial fluid. Premature for age cerebral and cerebellar atrophy. Extensive  T2 and FLAIR hyperintensity throughout the white matter consistent with small vessel disease. Vascular: Flow voids are maintained throughout the carotid, basilar, and vertebral arteries. There are no areas of chronic hemorrhage. Skull and upper cervical spine: Unremarkable visualized calvarium, skullbase, and cervical vertebrae. Pituitary, pineal, cerebellar tonsils unremarkable. Mild cervical stenosis due to spondylosis at C5-6. Sinuses/Orbits: Conjugate gaze to the RIGHT. Mild chronic sinus disease. Other: None. MRA HEAD FINDINGS The internal carotid arteries are widely patent. The basilar artery is widely patent with vertebrals codominant. There is no intracranial stenosis or aneurysm. The anterior cerebral arteries and middle cerebral arteries are widely patent. Both posterior cerebral arteries are diseased. There is apparent occlusion of the LEFT PCA with no distal flow related enhancement at its P1/P2 junction. On the RIGHT, the P2 PCA is severely diseased with segmental areas of narrowing or near total occlusion. No definite cerebellar branch flow-limiting stenosis or occlusion is seen. IMPRESSION: Acute RIGHT MCA lenticulostriate territory infarct affecting the lentiform nucleus and posterior limb internal capsule correlates with the CT abnormality. This is nonhemorrhagic. 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 No anterior circulation stenosis or occlusion. 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 Electronically Signed   By: Elsie Stain M.D.   On: 05/07/2016 12:17   Mr Maxine Glenn Head/brain ZO Cm  Result Date: 05/07/2016 CLINICAL DATA:  LEFT-sided weakness began 05/06/2016. Stroke risk factors include diabetes, hypertension, and previous cerebral infarction. EXAM: MRI HEAD WITHOUT CONTRAST MRA HEAD WITHOUT CONTRAST TECHNIQUE: Multiplanar, multiecho pulse sequences of the brain and surrounding structures were obtained without intravenous contrast. Angiographic images of  the head were obtained using MRA technique without contrast. COMPARISON:  CT head 05/07/2016.  MR brain 08/05/2013. FINDINGS: MRI HEAD FINDINGS Brain: Ovoid area of marked restricted diffusion, 5 x 12 mm cross-section, affecting the posterior lentiform nucleus and posterior limb internal capsule consistent with acute infarction. T2 shine through surrounds an area of previous centrum semiovale lacunar infarction with surrounding gliosis. No hemorrhage, mass lesion, hydrocephalus, or extra-axial fluid. Premature for age cerebral and cerebellar atrophy. Extensive T2 and FLAIR hyperintensity throughout the white matter consistent with small vessel disease. Vascular: Flow voids are maintained throughout the carotid, basilar, and vertebral arteries. There are no areas of chronic hemorrhage. Skull and upper cervical spine: Unremarkable visualized calvarium, skullbase, and cervical vertebrae. Pituitary, pineal, cerebellar tonsils unremarkable. Mild cervical stenosis due to spondylosis at C5-6. Sinuses/Orbits: Conjugate gaze to the RIGHT. Mild chronic sinus disease. Other: None. MRA HEAD FINDINGS The internal carotid arteries are widely patent. The basilar artery is widely patent with vertebrals codominant. There is no intracranial stenosis or aneurysm. The anterior cerebral arteries and middle cerebral arteries are widely patent. Both posterior cerebral arteries are diseased. There is apparent occlusion of the LEFT PCA with no distal flow  related enhancement at its P1/P2 junction. On the RIGHT, the P2 PCA is severely diseased with segmental areas of narrowing or near total occlusion. No definite cerebellar branch flow-limiting stenosis or occlusion is seen. IMPRESSION: Acute RIGHT MCA lenticulostriate territory infarct affecting the lentiform nucleus and posterior limb internal capsule correlates with the CT abnormality. This is nonhemorrhagic. 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 No anterior circulation  stenosis or occlusion. 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 Electronically Signed   By: Elsie StainJohn T Curnes M.D.   On: 05/07/2016 12:17    Assessment/Plan: Diagnosis: RIGHT MCA territory infarct Labs and images independently reviewed.  Records reviewed and summated above. Stroke: Continue secondary stroke prophylaxis and Risk Factor Modification listed below:   Antiplatelet therapy:   Blood Pressure Management:  Continue current medication with prn's with permisive HTN per primary team Statin Agent:   Diabetes management:   Tobacco abuse:   Left sided hemiparesis Motor recovery: Fluoxetine  1. Does the need for close, 24 hr/day medical supervision in concert with the patient's rehab needs make it unreasonable for this patient to be served in a less intensive setting? Yes  2. Co-Morbidities requiring supervision/potential complications: HTN (monitor and provide prns in accordance with increased physical exertion and pain), T2 DM (currently extremely labile) (Monitor in accordance with exercise and adjust meds as necessary), BUE tremor, depression/anxiety (ensure mood does not hinder progress of therapies), prior stroke with residual memory deficits, calcified MV with mild regurgitation (Monitor in accordance with increased physical activity and avoid UE resistance excercises), tobacco abuse (counsel), leukocytosis (cont to monitor for signs and symptoms of infection, further workup if indicated), dysphagia (advance diet as tolerated) 3. Due to bladder management, bowel management, safety, skin/wound care, disease management, medication administration and patient education, does the patient require 24 hr/day rehab nursing? Yes 4. Does the patient require coordinated care of a physician, rehab nurse, PT (1-2 hrs/day, 5 days/week), OT (1-2 hrs/day, 5 days/week) and SLP (1-2 hrs/day, 5 days/week) to address physical and functional deficits in the context of the above medical  diagnosis(es)? Yes Addressing deficits in the following areas: balance, endurance, locomotion, strength, transferring, bowel/bladder control, bathing, dressing, feeding, grooming, toileting, cognition, speech, swallowing and psychosocial support 5. Can the patient actively participate in an intensive therapy program of at least 3 hrs of therapy per day at least 5 days per week? Yes 6. The potential for patient to make measurable gains while on inpatient rehab is good and fair 7. Anticipated functional outcomes upon discharge from inpatient rehab are supervision and min assist  with PT, supervision and min assist with OT, supervision and min assist with SLP. 8. Estimated rehab length of stay to reach the above functional goals is: 10-14 days. 9. Does the patient have adequate social supports and living environment to accommodate these discharge functional goals? Potentially 10. Anticipated D/C setting: TBD 11. Anticipated post D/C treatments: HH therapy and Home excercise program 12. Overall Rehab/Functional Prognosis: good and fair  RECOMMENDATIONS: This patient's condition is appropriate for continued rehabilitative care in the following setting: Will need to inquire about baseline level of functioning.  If pt sedentary at baseline and without caregiver support during discharge during the day, recommend SNF, if pt cannot return home.   Patient has agreed to participate in recommended program. Potentially Note that insurance prior authorization may be required for reimbursement for recommended care.  Comment: Rehab Admissions Coordinator to follow up.  Maryla MorrowAnkit Tereza Gilham, MD, [place] DomFAAPMR 05/08/2016

## 2016-05-08 NOTE — Progress Notes (Signed)
STROKE TEAM PROGRESS NOTE   HISTORY OF PRESENT ILLNESS (per record) This is a 60-yo RH woman who was sent to [facility] for evaluation of possible stroke. History is obtained from the patient as well as from review of available medical records.   She presented to the ED at [facility] earlier today for the evaluation of slurred speech and left-sided weakness. This started around 1800 on 05/06/16 (LKW) but she initially refused to come to the ED as requested by her husband. They reported that she had a prior stroke but this only affected her memory according to them. NIHSS score was 5 (2 for facial paralysis, 1 for left arm drift, 1 for best language, and 1 for dysarthria). CTH without contrast was obtained and interpreted as showing a 14 mm hypodensity in the posterior limb of the R internal capsule, suspicious for an acute or early subacute ischemic infarction. She was transferred to [facility] at the family's request. She was taking aspirin 81 mg daily and simvastatin 40 mg daily prior to this admission. NHISS score: 5 in OSH ED. mRS score: 0. Patient was not administered IV t-PA secondary to outside of window at upon arrival to [facility]. She was admitted for further evaluation and treatment.   SUBJECTIVE (INTERVAL HISTORY) Her husband is at the bedside.  Overall she feels her condition is gradually improving. Husband stated that since her left thalamic infarct, pt memory and cognition are getting worse. She still can do most of her ADLs. She continues to smoke 1.5PPD. She check her glucose at home around 140s. She is also on ASA and zocor for stroke prevention at home.    OBJECTIVE Temp:  [98.3 F (36.8 C)-99.5 F (37.5 C)] 98.9 F (37.2 C) (12/04 0613) Pulse Rate:  [84-93] 90 (12/04 0613) Cardiac Rhythm: Sinus tachycardia (12/04 0700) Resp:  [18-20] 18 (12/04 0613) BP: (128-172)/(56-87) 128/87 (12/04 0613) SpO2:  [98 %-100 %] 100 % (12/04 0613)  CBC:   Recent Labs Lab 05/07/16 0809  WBC 14.1*  HGB 13.4  HCT 37.9  MCV 84.8  PLT 236    Basic Metabolic Panel:  Recent Labs Lab 05/07/16 0809  NA 140  K 3.5  CL 106  CO2 25  GLUCOSE 185*  BUN 12  CREATININE 0.89  CALCIUM 9.5    Lipid Panel:    Component Value Date/Time   CHOL 181 05/07/2016 0809   TRIG 74 05/07/2016 0809   HDL 59 05/07/2016 0809   CHOLHDL 3.1 05/07/2016 0809   VLDL 15 05/07/2016 0809   LDLCALC 107 (H) 05/07/2016 0809   HgbA1c:  Lab Results  Component Value Date   HGBA1C 8.2 (H) 05/07/2016   Urine Drug Screen:    Component Value Date/Time   LABOPIA NEGATIVE 05/25/2008 2120   COCAINSCRNUR NEGATIVE 05/25/2008 2120   LABBENZ NEGATIVE 05/25/2008 2120   AMPHETMU NEGATIVE 05/25/2008 2120      IMAGING I have personally reviewed the radiological images below and agree with the radiology interpretations.  Mr Brain 24Wo Contrast Mr Maxine GlennMra Head/brain Wo Cm 05/07/2016 Acute RIGHT MCA lenticulostriate territory infarct affecting the lentiform nucleus and posterior limb internal capsule correlates with the CT abnormality. This is nonhemorrhagic. 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 No anterior circulation stenosis or occlusion. 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  Carotid Doppler   There is 1-39% bilateral ICA stenosis. Vertebral artery flow is antegrade.   2D Echocardiogram  - Left ventricle: The cavity size was  normal. There was moderate concentric hypertrophy. Systolic function was vigorous. The estimated ejection fraction was in the range of 65% to 70%. Wall motion was normal; there were no regional wall motion abnormalities. Doppler parameters are consistent with abnormal left ventricular relaxation (grade 1 diastolic dysfunction). The E/e&' ratio is >15, suggesting elevated LV filling pressure. - Aortic valve: Sclerosis without stenosis. There was no regurgitation. - Mitral valve: Heavily calcified mitral annulus, especially  posteriorly. Mild regurgitation. Valve area by continuity equation (using LVOT flow): 2.14 cm^2. - Left atrium: The atrium was normal in size. - Inferior vena cava: The vessel was normal in size. The respirophasic diameter changes were in the normal range (>= 50%), consistent with normal central venous pressure. Impressions:  Compared to a prior study in 2009, there is more heavy posterior mitral annular calcification - unclear if this could have been a source of embolic stroke. LVEF is unchanged at 65-70%.   PHYSICAL EXAM  Temp:  [98.3 F (36.8 C)-99 F (37.2 C)] 98.8 F (37.1 C) (12/04 2130) Pulse Rate:  [84-94] 93 (12/04 2130) Resp:  [18-20] 20 (12/04 2130) BP: (128-163)/(56-91) 153/85 (12/04 2130) SpO2:  [93 %-100 %] 96 % (12/04 2130)  General - Well nourished, well developed, in no apparent distress, slow on mental processing.  Ophthalmologic - Fundi not visualized due to noncooperation.  Cardiovascular - Regular rate and rhythm.  Mental Status -  Level of arousal and orientation to month, place, and person were intact, but not orientated to year Language including expression, naming, repetition, comprehension was assessed and found intact, however, pt had significant psychomotor slowing  Cranial Nerves II - XII - II - Visual field intact OU. III, IV, VI - Extraocular movements intact. V - Facial sensation intact bilaterally. VII - left facial droop. VIII - Hearing & vestibular intact bilaterally. X - Palate elevates symmetrically. XI - Chin turning & shoulder shrug intact bilaterally. XII - Tongue protrusion intact.  Motor Strength - The patient's strength was normal in all extremities except LUE 4/5 and pronator drift was present on the left.  Bulk was normal and fasciculations were absent.   Motor Tone - Muscle tone was assessed at the neck and appendages and was normal.  Reflexes - The patient's reflexes were 1+ in all extremities and she had no pathological  reflexes.  Sensory - Light touch, temperature/pinprick were assessed and were symmetrical.    Coordination - The patient had normal movements in the hands and feet with no ataxia or dysmetria.  Tremor was absent.  Gait and Station - not tested due to safety concerns.    ASSESSMENT/PLAN Ms. THELIA TANKSLEY is a 61 y.o. female with history of right sided CVA in 2009 with no residual deficits, HTN, DM2, GERD presenting with trouble walking. She did not receive IV t-PA due to delay in arrival.   Stroke:  right lenticulostriate infarct likely secondary to small vessel disease source due to stroke risk factor including HTN, uncontrolled DM, HLD, heavy smoking.  Resultant  L hemiparesis, dysarthria, left facial droop  MRI  Acute R lenticulostriate infarct. 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    LDL 107  HgbA1c 8.2  Lovenox 40 mg sq daily for VTE prophylaxis  DIET DYS 3 Room service appropriate? Yes; Fluid consistency: Thin  aspirin 325 mg daily prior to admission, now on aspirin 325 mg daily and plavix for DAPT  management. Recommend to continue for 3 months and then change to plavix alone.   Patient counseled to be compliant with her antithrombotic medications  Ongoing aggressive stroke risk factor management  Therapy recommendations:  CIR. Admissions coordinator following  Disposition:  pending   Hx of stroke  2009 MRI left thalamic infarcts  MRA - b/l PCA stenosis  On ASA and zocor at home  Hypertension  Stable  Permissive hypertension (OK if < 220/120) but gradually normalize in 5-7 days  Long-term BP goal normotensive  Hyperlipidemia  Home meds:  zocor 40l  LDL 107, goal < 70  Changed zocor to lipitor 40  Continue lipitor on discharge  Diabetes type II  HgbA1c 8.2, goal < 7.0  Uncontrolled  Hyperglycemia on monitoring  SSI   On lantus  Close PCP follow up for  better DM control  Tobacco abuse  Current smoker  Smoking cessation counseling provided  Nicotine patch if needed  Pt is willing to quit  Other Stroke Risk Factors  Hx stroke/TIA  05/2008 - L thalamic infarct, narrow posterior circulation arteries ([facility] and [facility]. Neuro did not see patient)  Other Active Problems  Anxiety on xanax  GERD  Hospital day # 1  Neurology will sign off. Please call with questions. Pt will [facility] in about 6 weeks. Thanks for the consult.  Marvel PlanJindong Joffrey Kerce, MD PhD Stroke Neurology 05/08/2016 11:33 PM    To contact Stroke Continuity provider, please refer to [email or web address]. After hours, contact General Neurology

## 2016-05-09 LAB — GLUCOSE, CAPILLARY
Glucose-Capillary: 215 mg/dL — ABNORMAL HIGH (ref 65–99)
Glucose-Capillary: 249 mg/dL — ABNORMAL HIGH (ref 65–99)
Glucose-Capillary: 234 mg/dL — ABNORMAL HIGH (ref 65–99)
Glucose-Capillary: 84 mg/dL (ref 65–99)
Glucose-Capillary: 84 mg/dL (ref 65–99)

## 2016-05-09 LAB — CBC
HCT: 38.7 % (ref 36.0–46.0)
Hemoglobin: 13.3 g/dL (ref 12.0–15.0)
MCH: 29.5 pg (ref 26.0–34.0)
MCHC: 34.4 g/dL (ref 30.0–36.0)
MCV: 85.8 fL (ref 78.0–100.0)
Platelets: 240 10*3/uL (ref 150–400)
RBC: 4.51 MIL/uL (ref 3.87–5.11)
RDW: 12.4 % (ref 11.5–15.5)
WBC: 10.9 10*3/uL — ABNORMAL HIGH (ref 4.0–10.5)

## 2016-05-09 LAB — BASIC METABOLIC PANEL
Anion gap: 11 (ref 5–15)
BUN: 14 mg/dL (ref 6–20)
CO2: 26 mmol/L (ref 22–32)
Calcium: 9.5 mg/dL (ref 8.9–10.3)
Chloride: 101 mmol/L (ref 101–111)
Creatinine, Ser: 0.96 mg/dL (ref 0.44–1.00)
GFR calc Af Amer: >60 mL/min (ref >60)
GFR calc non Af Amer: >60 mL/min (ref >60)
Glucose, Bld: 179 mg/dL — ABNORMAL HIGH (ref 65–99)
Potassium: 3.7 mmol/L (ref 3.5–5.1)
Sodium: 138 mmol/L (ref 135–145)

## 2016-05-09 MED ORDER — ATORVASTATIN CALCIUM 40 MG PO TABS: 40.0000 mg | ORAL_TABLET | Freq: Every day | ORAL | 1 refills | Status: AC

## 2016-05-09 MED ORDER — CLONAZEPAM 0.5 MG PO TABS: 0.5000 mg | ORAL_TABLET | Freq: Two times a day (BID) | ORAL | 0 refills | Status: AC

## 2016-05-09 MED ORDER — BASAGLAR KWIKPEN 100 UNIT/ML ~~LOC~~ SOPN: 25.0000 [IU] | PEN_INJECTOR | SUBCUTANEOUS | 12 refills | Status: DC

## 2016-05-09 MED ORDER — ALPRAZOLAM 0.5 MG PO TABS
1.0000 mg | ORAL_TABLET | Freq: Once | ORAL | Status: AC
  Administered 2016-05-09: 1 mg via ORAL
  Filled 2016-05-09: qty 2

## 2016-05-09 MED ORDER — CLOPIDOGREL BISULFATE 75 MG PO TABS: 75.0000 mg | ORAL_TABLET | Freq: Every day | ORAL | 1 refills | Status: DC

## 2016-05-09 NOTE — Discharge Summary (Addendum)
Physician Discharge Summary  Chloe Owens MRN: [id number] DOB/AGE: 62-Jun-1957 62 y.o.  PCP: Pcp Not In System   Admit date: 05/07/2016 Discharge date: 05/09/2016  Discharge Diagnoses:    Principal Problem:   Acute ischemic stroke [facility]) Active Problems:   Hypotension   Diabetes mellitus ([place])   Hyperlipidemia   History of CVA (cerebrovascular accident)   Depression   Generalized anxiety disorder   History of CVA with residual deficit   Benign essential HTN   Non-rheumatic mitral regurgitation   Tobacco abuse   Leukocytosis   Dysphagia, post-stroke    Follow-up recommendations Follow-up with PCP in 3-5 days , including all  additional recommended appointments as below Follow-up CBC, CMP in 3-5 days   Addendum Monitor as inpatient , pending CIR decision due to multiple psyche issues    Current Discharge Medication List    START taking these medications   Details  atorvastatin (LIPITOR) 40 MG tablet Take 1 tablet (40 mg total) by mouth daily at 6 PM. Qty: 30 tablet, Refills: 1    clopidogrel (PLAVIX) 75 MG tablet Take 1 tablet (75 mg total) by mouth daily. Qty: 30 tablet, Refills: 1      CONTINUE these medications which have CHANGED   Details  clonazePAM (KLONOPIN) 0.5 MG tablet Take 1 tablet (0.5 mg total) by mouth 2 (two) times daily. Qty: 10 tablet, Refills: 0    Insulin Glargine (BASAGLAR KWIKPEN) 100 UNIT/ML SOPN Inject 0.25 mLs (25 Units total) into the skin every morning. Qty: 1 pen, Refills: 12      CONTINUE these medications which have NOT CHANGED   Details  ARIPiprazole (ABILIFY) 30 MG tablet Take 30 mg by mouth 2 (two) times daily.    aspirin EC 325 MG tablet Take 1 tablet (325 mg total) by mouth daily. Qty: 42 tablet, Refills: 0    buPROPion (WELLBUTRIN SR) 150 MG 12 hr tablet Take 150 mg by mouth 2 (two) times daily.    JANUVIA 100 MG tablet Take 100 mg by mouth every evening.    lisinopril (PRINIVIL,ZESTRIL) 20 MG tablet Take 20 mg by  mouth daily.    omeprazole (PRILOSEC) 20 MG capsule Take 20 mg by mouth 2 (two) times daily.     ondansetron (ZOFRAN) 4 MG tablet Take 1 tablet (4 mg total) by mouth every 6 (six) hours as needed for nausea. Qty: 20 tablet, Refills: 0    oxyCODONE (OXY IR/ROXICODONE) 5 MG immediate release tablet Take 1-2 tablets (5-10 mg total) by mouth every 3 (three) hours as needed. Qty: 50 tablet, Refills: 0      STOP taking these medications     clindamycin (CLEOCIN) 150 MG capsule      simvastatin (ZOCOR) 40 MG tablet      ondansetron (ZOFRAN ODT) 4 MG disintegrating tablet          Discharge Condition: Stable Discharge Instructions Get Medicines reviewed and adjusted: Please take all your medications with you for your next visit with your Primary MD  Please request your Primary MD to go over all hospital tests and procedure/radiological results at the follow up, please ask your Primary MD to get all Hospital records sent to his/her office.  If you experience worsening of your admission symptoms, develop shortness of breath, life threatening emergency, suicidal or homicidal thoughts you must seek medical attention immediately by calling 911 or calling your MD immediately if symptoms less severe.  You must read complete instructions/literature along with all the possible adverse reactions/side  effects for all the Medicines you take and that have been prescribed to you. Take any new Medicines after you have completely understood and accpet all the possible adverse reactions/side effects.   Do not drive when taking Pain medications.   Do not take more than prescribed Pain, Sleep and Anxiety Medications  Special Instructions: If you have smoked or chewed Tobacco in the last 2 yrs please stop smoking, stop any regular Alcohol and or any Recreational drug use.  Wear Seat belts while driving.  Please note  You were cared for by a hospitalist during your hospital stay. Once you are  discharged, your primary care physician will handle any further medical issues. Please note that NO REFILLS for any discharge medications will be authorized once you are discharged, as it is imperative that you return to your primary care physician (or establish a relationship with a primary care physician if you do not have one) for your aftercare needs so that they can reassess your need for medications and monitor your lab values.  Discharge Instructions    Ambulatory referral to Neurology    Complete by:  As directed    Follow up with NP Cecille Rubin at [facility] in about 2 months. Thanks.       Allergies  Allergen Reactions  . Codeine Nausea And Vomiting and Rash  . Penicillins Rash      Disposition: Inpatient rehabilitation   Consults:  Neurology Inpatient rehabilitation     Significant Diagnostic Studies:  Mr Brain 78 Contrast  Result Date: 05/07/2016 CLINICAL DATA:  LEFT-sided weakness began 05/06/2016. Stroke risk factors include diabetes, hypertension, and previous cerebral infarction. EXAM: MRI HEAD WITHOUT CONTRAST MRA HEAD WITHOUT CONTRAST TECHNIQUE: Multiplanar, multiecho pulse sequences of the brain and surrounding structures were obtained without intravenous contrast. Angiographic images of the head were obtained using MRA technique without contrast. COMPARISON:  CT head 05/07/2016.  MR brain 08/05/2013. FINDINGS: MRI HEAD FINDINGS Brain: Ovoid area of marked restricted diffusion, 5 x 12 mm cross-section, affecting the posterior lentiform nucleus and posterior limb internal capsule consistent with acute infarction. T2 shine through surrounds an area of previous centrum semiovale lacunar infarction with surrounding gliosis. No hemorrhage, mass lesion, hydrocephalus, or extra-axial fluid. Premature for age cerebral and cerebellar atrophy. Extensive T2 and FLAIR hyperintensity throughout the white matter consistent with small vessel disease. Vascular: Flow voids are maintained  throughout the carotid, basilar, and vertebral arteries. There are no areas of chronic hemorrhage. Skull and upper cervical spine: Unremarkable visualized calvarium, skullbase, and cervical vertebrae. Pituitary, pineal, cerebellar tonsils unremarkable. Mild cervical stenosis due to spondylosis at C5-6. Sinuses/Orbits: Conjugate gaze to the RIGHT. Mild chronic sinus disease. Other: None. MRA HEAD FINDINGS The internal carotid arteries are widely patent. The basilar artery is widely patent with vertebrals codominant. There is no intracranial stenosis or aneurysm. The anterior cerebral arteries and middle cerebral arteries are widely patent. Both posterior cerebral arteries are diseased. There is apparent occlusion of the LEFT PCA with no distal flow related enhancement at its P1/P2 junction. On the RIGHT, the P2 PCA is severely diseased with segmental areas of narrowing or near total occlusion. No definite cerebellar branch flow-limiting stenosis or occlusion is seen. IMPRESSION: Acute RIGHT MCA lenticulostriate territory infarct affecting the lentiform nucleus and posterior limb internal capsule correlates with the CT abnormality. This is nonhemorrhagic. 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 No anterior circulation stenosis or occlusion. 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 Electronically Signed  By: Staci Righter M.D.   On: 05/07/2016 12:17   Mr Jodene Nam Head/brain XF Cm  Result Date: 05/07/2016 CLINICAL DATA:  LEFT-sided weakness began 05/06/2016. Stroke risk factors include diabetes, hypertension, and previous cerebral infarction. EXAM: MRI HEAD WITHOUT CONTRAST MRA HEAD WITHOUT CONTRAST TECHNIQUE: Multiplanar, multiecho pulse sequences of the brain and surrounding structures were obtained without intravenous contrast. Angiographic images of the head were obtained using MRA technique without contrast. COMPARISON:  CT head 05/07/2016.  MR brain 08/05/2013. FINDINGS: MRI  HEAD FINDINGS Brain: Ovoid area of marked restricted diffusion, 5 x 12 mm cross-section, affecting the posterior lentiform nucleus and posterior limb internal capsule consistent with acute infarction. T2 shine through surrounds an area of previous centrum semiovale lacunar infarction with surrounding gliosis. No hemorrhage, mass lesion, hydrocephalus, or extra-axial fluid. Premature for age cerebral and cerebellar atrophy. Extensive T2 and FLAIR hyperintensity throughout the white matter consistent with small vessel disease. Vascular: Flow voids are maintained throughout the carotid, basilar, and vertebral arteries. There are no areas of chronic hemorrhage. Skull and upper cervical spine: Unremarkable visualized calvarium, skullbase, and cervical vertebrae. Pituitary, pineal, cerebellar tonsils unremarkable. Mild cervical stenosis due to spondylosis at C5-6. Sinuses/Orbits: Conjugate gaze to the RIGHT. Mild chronic sinus disease. Other: None. MRA HEAD FINDINGS The internal carotid arteries are widely patent. The basilar artery is widely patent with vertebrals codominant. There is no intracranial stenosis or aneurysm. The anterior cerebral arteries and middle cerebral arteries are widely patent. Both posterior cerebral arteries are diseased. There is apparent occlusion of the LEFT PCA with no distal flow related enhancement at its P1/P2 junction. On the RIGHT, the P2 PCA is severely diseased with segmental areas of narrowing or near total occlusion. No definite cerebellar branch flow-limiting stenosis or occlusion is seen. IMPRESSION: Acute RIGHT MCA lenticulostriate territory infarct affecting the lentiform nucleus and posterior limb internal capsule correlates with the CT abnormality. This is nonhemorrhagic. 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 No anterior circulation stenosis or occlusion. 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 Electronically Signed   By: Staci Righter  M.D.   On: 05/07/2016 12:17  2-D echo LV EF: 65% -   70%  ------------------------------------------------------------------- Indications:      CVA 436.  ------------------------------------------------------------------- History:   Risk factors:  Diabetes mellitus. Dyslipidemia.  ------------------------------------------------------------------- Study Conclusions  - Left ventricle: The cavity size was normal. There was moderate   concentric hypertrophy. Systolic function was vigorous. The   estimated ejection fraction was in the range of 65% to 70%. Wall   motion was normal; there were no regional wall motion   abnormalities. Doppler parameters are consistent with abnormal   left ventricular relaxation (grade 1 diastolic dysfunction). The   E/e&' ratio is >15, suggesting elevated LV filling pressure. - Aortic valve: Sclerosis without stenosis. There was no   regurgitation. - Mitral valve: Heavily calcified mitral annulus, especially   posteriorly. Mild regurgitation. Valve area by continuity   equation (using LVOT flow): 2.14 cm^2. - Left atrium: The atrium was normal in size. - Inferior vena cava: The vessel was normal in size. The   respirophasic diameter changes were in the normal range (>= 50%),   consistent with normal central venous pressure.  Impressions:  - Compared to a prior study in 2009, there is more heavy posterior   mitral annular calcification - unclear if this could have been a   source of embolic stroke. LVEF is unchanged at 65-70%.    There were  no vitals filed for this visit.   Microbiology: No results found for this or any previous visit (from the past 240 hour(s)).     Blood Culture    Component Value Date/Time   SDES BLOOD RIGHT HAND 04/14/2012 2035   SPECREQUEST BOTTLES DRAWN AEROBIC ONLY 3CC 04/14/2012 2035   CULT NO GROWTH 5 DAYS 04/14/2012 2035   REPTSTATUS 04/21/2012 FINAL 04/14/2012 2035      Labs: Results for orders  placed or performed during the hospital encounter of 05/07/16 (from the past 48 hour(s))  Glucose, capillary     Status: Abnormal   Collection Time: 05/07/16  1:23 PM  Result Value Ref Range   Glucose-Capillary 184 (H) 65 - 99 mg/dL   Comment 1 Notify RN    Comment 2 Document in Chart   Glucose, capillary     Status: Abnormal   Collection Time: 05/07/16  4:16 PM  Result Value Ref Range   Glucose-Capillary 199 (H) 65 - 99 mg/dL  Glucose, capillary     Status: Abnormal   Collection Time: 05/07/16  9:27 PM  Result Value Ref Range   Glucose-Capillary 209 (H) 65 - 99 mg/dL  Glucose, capillary     Status: Abnormal   Collection Time: 05/08/16  6:55 AM  Result Value Ref Range   Glucose-Capillary 42 (LL) 65 - 99 mg/dL   Comment 1 Notify RN    Comment 2 Document in Chart   Glucose, capillary     Status: Abnormal   Collection Time: 05/08/16  7:37 AM  Result Value Ref Range   Glucose-Capillary 261 (H) 65 - 99 mg/dL  Troponin I     Status: None   Collection Time: 05/08/16  9:40 AM  Result Value Ref Range   Troponin I <0.03 <0.03 ng/mL  Glucose, capillary     Status: Abnormal   Collection Time: 05/08/16 11:35 AM  Result Value Ref Range   Glucose-Capillary 333 (H) 65 - 99 mg/dL   Comment 1 Notify RN    Comment 2 Document in Chart   Urine rapid drug screen (hosp performed)     Status: None   Collection Time: 05/08/16 12:48 PM  Result Value Ref Range   Opiates NONE DETECTED NONE DETECTED   Cocaine NONE DETECTED NONE DETECTED   Benzodiazepines NONE DETECTED NONE DETECTED   Amphetamines NONE DETECTED NONE DETECTED   Tetrahydrocannabinol NONE DETECTED NONE DETECTED   Barbiturates NONE DETECTED NONE DETECTED    Comment:        DRUG SCREEN FOR MEDICAL PURPOSES ONLY.  IF CONFIRMATION IS NEEDED FOR ANY PURPOSE, NOTIFY LAB WITHIN 5 DAYS.        LOWEST DETECTABLE LIMITS FOR URINE DRUG SCREEN Drug Class       Cutoff (ng/mL) Amphetamine      1000 Barbiturate      200 Benzodiazepine    315 Tricyclics       176 Opiates          300 Cocaine          300 THC              50   Urinalysis, Routine w reflex microscopic (not at [facility])     Status: Abnormal   Collection Time: 05/08/16 12:48 PM  Result Value Ref Range   Color, Urine YELLOW YELLOW   APPearance CLOUDY (A) CLEAR   Specific Gravity, Urine 1.038 (H) 1.005 - 1.030   pH 5.0 5.0 - 8.0   Glucose, UA >1000 (A)  NEGATIVE mg/dL   Hgb urine dipstick TRACE (A) NEGATIVE   Bilirubin Urine NEGATIVE NEGATIVE   Ketones, ur 15 (A) NEGATIVE mg/dL   Protein, ur 100 (A) NEGATIVE mg/dL   Nitrite NEGATIVE NEGATIVE   Leukocytes, UA TRACE (A) NEGATIVE  Urine microscopic-add on     Status: Abnormal   Collection Time: 05/08/16 12:48 PM  Result Value Ref Range   Squamous Epithelial / LPF 0-5 (A) NONE SEEN   WBC, UA 6-30 0 - 5 WBC/hpf   RBC / HPF NONE SEEN 0 - 5 RBC/hpf   Bacteria, UA RARE (A) NONE SEEN   Urine-Other YEAST PRESENT   Troponin I     Status: None   Collection Time: 05/08/16  3:14 PM  Result Value Ref Range   Troponin I <0.03 <0.03 ng/mL  Glucose, capillary     Status: Abnormal   Collection Time: 05/08/16  4:56 PM  Result Value Ref Range   Glucose-Capillary 104 (H) 65 - 99 mg/dL   Comment 1 Notify RN    Comment 2 Document in Chart   Glucose, capillary     Status: Abnormal   Collection Time: 05/08/16  9:28 PM  Result Value Ref Range   Glucose-Capillary 218 (H) 65 - 99 mg/dL   Comment 1 Notify RN    Comment 2 Document in Chart   CBC     Status: Abnormal   Collection Time: 05/09/16  4:08 AM  Result Value Ref Range   WBC 10.9 (H) 4.0 - 10.5 K/uL   RBC 4.51 3.87 - 5.11 MIL/uL   Hemoglobin 13.3 12.0 - 15.0 g/dL   HCT 38.7 36.0 - 46.0 %   MCV 85.8 78.0 - 100.0 fL   MCH 29.5 26.0 - 34.0 pg   MCHC 34.4 30.0 - 36.0 g/dL   RDW 12.4 11.5 - 15.5 %   Platelets 240 150 - 400 K/uL  Basic metabolic panel     Status: Abnormal   Collection Time: 05/09/16  4:08 AM  Result Value Ref Range   Sodium 138 135 - 145 mmol/L    Potassium 3.7 3.5 - 5.1 mmol/L   Chloride 101 101 - 111 mmol/L   CO2 26 22 - 32 mmol/L   Glucose, Bld 179 (H) 65 - 99 mg/dL   BUN 14 6 - 20 mg/dL   Creatinine, Ser 0.96 0.44 - 1.00 mg/dL   Calcium 9.5 8.9 - 10.3 mg/dL   GFR calc non Af Amer >60 >60 mL/min   GFR calc Af Amer >60 >60 mL/min    Comment: (NOTE) The eGFR has been calculated using the CKD EPI equation. This calculation has not been validated in all clinical situations. eGFR's persistently <60 mL/min signify possible Chronic Kidney Disease.    Anion gap 11 5 - 15  Glucose, capillary     Status: Abnormal   Collection Time: 05/09/16  6:17 AM  Result Value Ref Range   Glucose-Capillary 215 (H) 65 - 99 mg/dL   Comment 1 Notify RN    Comment 2 Document in Chart   Glucose, capillary     Status: Abnormal   Collection Time: 05/09/16 11:50 AM  Result Value Ref Range   Glucose-Capillary 249 (H) 65 - 99 mg/dL   Comment 1 Notify RN    Comment 2 Document in Chart      Lipid Panel     Component Value Date/Time   CHOL 181 05/07/2016 0809   TRIG 74 05/07/2016 0809   HDL 59 05/07/2016 0809  CHOLHDL 3.1 05/07/2016 0809   VLDL 15 05/07/2016 0809   LDLCALC 107 (H) 05/07/2016 0809     Lab Results  Component Value Date   HGBA1C 8.2 (H) 05/07/2016   HGBA1C 8.3 (H) 04/14/2012   HGBA1C (H) 05/25/2008    9.8 (NOTE)   The ADA recommends the following therapeutic goal for glycemic   control related to Hgb A1C measurement:   Goal of Therapy:   < 7.0% Hgb A1C   Reference: American Diabetes Association: Clinical Practice   Recommendations 2008, Diabetes Care,  2008, 31:(Suppl 1).        HPI :   11-yo RH woman who was sent to [facility] for evaluation of possible stroke. History is obtained from the patient as well as from review of available medical records.   She presented to the ED at [facility] earlier today for the evaluation of slurred speech and left-sided weakness. This started around  1800 on 05/06/16 (LKW) but she initially refused to come to the ED as requested by her husband. They reported that she had a prior stroke but this only affected her memory according to them. NIHSS score was 5 (2 for facial paralysis, 1 for left arm drift, 1 for best language, and 1 for dysarthria). CTH without contrast was obtained and interpreted as showing a 14 mm hypodensity in the posterior limb of the R internal capsule, suspicious for an acute or early subacute ischemic infarction. She was transferred to [facility] at the family's request. She was taking aspirin 81 mg daily and simvastatin 40 mg daily prior to this admission. NHISS score: 5 in OSH ED. mRS score: 0. Patient was not administered IV t-PA secondary to outside of window at upon arrival to [facility]. She was admitted for further evaluation and treatment  HOSPITAL COURSE:   Stroke:  right lenticulostriate infarct likely secondary to small vessel disease source due to stroke risk factor including HTN, uncontrolled DM, HLD, heavy smoking.  Resultant  L hemiparesis, dysarthria, left facial droop  MRI  Acute R lenticulostriate infarct. 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    LDL 107  HgbA1c 8.2  Lovenox 40 mg sq daily for VTE prophylaxis  DIET DYS 3 Room service appropriate? Yes; Fluid consistency: Thin  aspirin 325 mg daily prior to admission, now on aspirin 325 mg daily and plavix for DAPT management. Recommend to continue for 3 months and then change to plavix alone.   Patient counseled to be compliant with her antithrombotic medications  Ongoing aggressive stroke risk factor management  Therapy recommendations:  CIR. Admissions coordinator following     Hx of stroke  2009 MRI left thalamic infarcts  MRA - b/l PCA stenosis  On ASA and zocor at home   Hypertension There were no vitals taken for this visit. Allow  permisive HTN due to above,   Resume lisinopril,    Type II Diabetes Current blood sugar level is unknown at this time  Recent Labs       Lab Results  Component Value Date   HGBA1C 8.3 (H) 04/14/2012     Continue   Lantus, Add sliding scale if indicated   Anxiety Continue home Xanax  Depression Continue home med with Lexapro and Depakote    GERD,no acute symptoms: Continue PPI  Tobacco abuse - Nicotine patch was offered but patient declined  -  Counseled cessation     Discharge Exam:   Blood pressure (!) 147/68, pulse 99, temperature 97.3 F (36.3 C), temperature source Oral, resp. rate 20, SpO2 97 %.  Motor Strength - The patient's strength was normal in all extremities except LUE 4/5 and pronator drift was present on the left.  Bulk was normal and fasciculations were absent.    Follow-up Information    Dennie Bible, NP. Schedule an appointment as soon as possible for a visit in 6 week(s).   Specialty:  Family Medicine Contact information: [address] [place] [address] [phone]           Signed: Reyne Dumas 05/09/2016, 11:53 AM        Time spent >45 mins

## 2016-05-09 NOTE — Progress Notes (Signed)
Occupational Therapy Treatment Patient Details Name: Chloe Owens Checketts MRN: [id number] DOB: December 04, 1955 Today's Date: 05/09/2016    History of present illness 60 year old lady with past medical history of hypertension, hyperlipidemia, diabetes mellitus, stroke, GERD, depression, who presents with left-sided weakness and left facial numbness. Acute RIGHT MCA lenticulostriate territory infarct.   OT comments  Focus of session on L UE strengthening, bed mobility and UB dressing. Husband participated in session and encouraged pt. Educated pt and husband in positioning and protecting L UE in bed and chair. Continues to be an excellent CIR candidate.  Follow Up Recommendations  CIR;Supervision/Assistance - 24 hour    Equipment Recommendations       Recommendations for Other Services      Precautions / Restrictions Precautions Precautions: Fall       Mobility Bed Mobility   Bed Mobility: Rolling;Sidelying to Sit;Sit to Sidelying Rolling: Supervision;Min assist (supervision toward R, min to L) Sidelying to sit: Mod assist     Sit to sidelying: Min assist General bed mobility comments: assist to guide L LE and to raise trunk, cues for technique, assist for LEs back into bed  Transfers                      Balance     Sitting balance-Leahy Scale: Fair                             ADL                   Upper Body Dressing : Moderate assistance;Sitting                     General ADL Comments: Began education in UB dressing using compensatory strategies while seated EOB with front opening gown.      Vision                     Perception     Praxis      Cognition   Behavior During Therapy: Flat affect Overall Cognitive Status: Impaired/Different from baseline Area of Impairment: Following commands;Problem solving        Following Commands: Follows one step commands with increased time     Problem Solving: Slow processing      Extremity/Trunk Assessment               Exercises General Exercises - Upper Extremity Shoulder Flexion: AAROM;Left;10 reps;Seated Shoulder ABduction: AAROM;Left;10 reps;Supine Shoulder Horizontal ABduction: AAROM;Left;10 reps;Supine Shoulder Horizontal ADduction: AAROM;Left;10 reps;Supine Elbow Flexion: AAROM;Left;10 reps;Supine Elbow Extension: AAROM;Left;10 reps;Supine Wrist Extension: AAROM;Left;10 reps;Supine (with facilitation)   Shoulder Instructions       General Comments      Pertinent Vitals/ Pain       Pain Assessment: No/denies pain  Home Living     Available Help at Discharge: Family;Available 24 hours/day                     Bathroom Accessibility: Yes How Accessible: Accessible via wheelchair;Accessible via walker            Prior Functioning/Environment              Frequency  Min 3X/week        Progress Toward Goals  OT Goals(current goals can now be found in the care plan section)  Progress towards OT goals: Progressing toward goals  Acute Rehab OT Goals Patient Stated  Goal: to get back to indpendence Time For Goal Achievement: 05/21/16 Potential to Achieve Goals: Good  Plan Discharge plan remains appropriate    Co-evaluation                 End of Session     Activity Tolerance Patient tolerated treatment well   Patient Left in bed;with call bell/phone within reach;with bed alarm set;with family/visitor present   Nurse Communication          Time: 1549-1610 OT Time Calculation (min): 21 min  Charges: OT General Charges $OT Visit: 1 Procedure OT Treatments $Neuromuscular Re-education: 8-22 mins  Evern BioMayberry, Luna Audia Lynn 05/09/2016, 4:21 PM  [phone]

## 2016-05-09 NOTE — Progress Notes (Signed)
Speech Language Pathology Treatment: Dysphagia  Patient Details Name: Chloe Owens MRN: [id number] DOB: Feb 04, 1956 Today's Date: 05/09/2016 Time: [id number] SLP Time Calculation (min) (ACUTE ONLY): 18 min  Assessment / Plan / Recommendation Clinical Impression  Skilled treatment session focused on addressing dysphagia goals. SLP facilitated session by providing set-up of regular textures and thin liquids via cup per patient request.  SLP also provided skilled observation of patient self-feeding regular textures and thin liquids with cough x1 following multiple bites of dry, regular texture.  SLP facilitated session with Min verbal cues for use of lingual sweep and intermittent thin liquid sips which effectively reduced left sided buccal residue and further overt s/s of aspiration.  Husband present and educated on recommendations for upgrade to regular textures with brief full supervision to ensure carryover of safe swallow precautions, which were posted over head of bed to ensure carryover.      HPI HPI: 60 year old lady with past medical history of hypertension, hyperlipidemia, diabetes mellitus, stroke, GERD, depression, who presents with left-sided weakness and left facial numbness. Acute RIGHT MCA lenticulostriate territory infarct.      SLP Plan  Continue with current plan of care     Recommendations  Diet recommendations: Regular;Thin liquid Liquids provided via: Cup;Straw Medication Administration: Whole meds with puree Supervision: Patient able to self feed;Full supervision/cueing for compensatory strategies Compensations: Slow rate;Small sips/bites;Lingual sweep for clearance of pocketing Postural Changes and/or Swallow Maneuvers: Seated upright 90 degrees                Oral Care Recommendations: Oral care BID Follow up Recommendations: Inpatient Rehab Plan: Continue with current plan of care       GO              Chloe FerrettiMelissa Mikiyah Owens, M.A.,  CCC-SLP [phone]   Aaron Bostwick 05/09/2016, 3:03 PM

## 2016-05-09 NOTE — Progress Notes (Signed)
I am continuing to follow for possible IP Rehab.  I do not have bed availability today for Chloe Owens.  Please call if questions.  Weldon PickingSusan Carinna Newhart PT Inpatient Rehab Admissions Coordinator Cell [phone] Office [phone]

## 2016-05-09 NOTE — PMR Pre-admission (Signed)
PMR Admission Coordinator Pre-Admission Assessment  [name] is an 60 y.o., female MRN: [id number] DOB: 1955-10-08 Height:   Weight:                Insurance Information HMO:     PPO:      PCP:      IPA:      80/20:      OTHER:  PRIMARY: Medicare A and B      Policy#:  [id number]      Subscriber:  self CM Name:        Phone#:      Fax#:  Pre-Cert#:       Employer: disabled; formerly employed by [employer]:  Phone #:       Name:  Eff. Date: Part A 11/04/10; Part B 06/06/11     Deduct:  $1316      Out of Pocket Max:  n/a      Life Max:  n/a CIR:  100% after deductible satisfied      SNF:  $0 days 1-20 Outpatient:  80%     Co-Pay:  20% Home Health:  100%      Co-Pay:  DME:  80%     Co-Pay:  20% Providers:  Pt. choice SECONDARY:  BC[employer] State Health Plan      Policy#:  [id number]      Subscriber:  self CM Name:       Phone#:      Fax#:  Pre-Cert#:       Employer:  Benefits:  Phone #:      Name:  Eff. Date:      Deduct:       Out of Pocket Max:       Life Max:  CIR:       SNF:  Outpatient:      Co-Pay:  Home Health:       Co-Pay:  DME:      Co-Pay:   Medicaid Application Date:       Case Manager:  Disability Application Date:       Case Worker:   Emergency Contact Information Contact Information    Name Relation Home Work Mobile   Magner,Garland E Spouse [phone]  [phone]   Nathan, [place] [phone]  [phone]     Current Medical History  Patient Admitting Diagnosis: RIGHT MCA territory infarct History of Present Illness: Chloe Owens is a 60 year old female with history of T2DM, HTN, BUE tremors, CVA 2009 with memory deficits  who was admitted on 05/07/16 with slurred speech, difficulty walking and left sided weakness since evening prior to admission. MRI/MRA brain done revealing acute RIGHT MCA lenticulostriate territory infarct affecting the lentiform nucleus and posterior limb internal capsule, advanced atrophy and SVD, severe  bilateral PCA disease. 2D echo with EF 65-70%, moderate LVH, aortic sclerosis without stenosis and heavily calcified MV with mild regurgitation could be source of embolic stroke. Carotid dopplers without significant ICA stenosis. 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 PT/OT evaluations done revealing LUE weakness with poor coordination, balance deficits, question of visual deficits and patient requires cues for sequencing with tasks. CIR recommended for follow up therapy  Total: 7 NIH    Past Medical History  Past Medical History:  Diagnosis Date  . Anxiety   . Bladder infection    hx of  . Depression   . Diabetes mellitus without complication (HCC)   .  GERD (gastroesophageal reflux disease)   . Hyperlipidemia   . Hypertension   . Neuromuscular disorder (HCC)   . Stroke [facility])    05/2008    Family History  family history includes CAD in her father and mother; Cancer - Other in her brother and mother.  Prior Rehab/Hospitalizations:  Has the patient had major surgery during 100 days prior to admission? No  Current Medications   Current Facility-Administered Medications:  .   stroke: mapping our early stages of recovery book, , Does not apply, Once, Levie Heritage, DO .  acetaminophen (TYLENOL) tablet 650 mg, 650 mg, Oral, Q6H PRN, Leda Gauze, NP, 650 mg at 05/09/16 2036 .  [DISCONTINUED] aspirin suppository 300 mg, 300 mg, Rectal, Daily, 300 mg at 05/07/16 1648 **OR** aspirin tablet 325 mg, 325 mg, Oral, Daily, Rhona Raider Stinson, DO, 325 mg at 05/10/16 0941 .  atorvastatin (LIPITOR) tablet 40 mg, 40 mg, Oral, q1800, Marvel Plan, MD, 40 mg at 05/09/16 1722 .  clonazePAM (KLONOPIN) tablet 0.5 mg, 0.5 mg, Oral, BID, Richarda Overlie, MD, 0.5 mg at 05/10/16 0941 .  clopidogrel (PLAVIX) tablet 75 mg, 75 mg, Oral, Daily, Marvel Plan, MD, 75 mg at 05/10/16 0941 .  enoxaparin (LOVENOX) injection 40 mg, 40 mg, Subcutaneous,  Q24H, Rhona Raider Stinson, DO, 40 mg at 05/10/16 0941 .  insulin aspart (novoLOG) injection 0-15 Units, 0-15 Units, Subcutaneous, TID WC, Rhona Raider Stinson, DO, 3 Units at 05/10/16 [phone] .  insulin aspart (novoLOG) injection 0-5 Units, 0-5 Units, Subcutaneous, QHS, Rhona Raider [place], DO, 2 Units at 05/08/16 2203 .  insulin glargine (LANTUS) injection 18 Units, 18 Units, Subcutaneous, Daily, Richarda Overlie, MD, 18 Units at 05/10/16 0941 .  ondansetron [facility]) injection 4 mg, 4 mg, Intravenous, Q6H PRN, Rhona Raider Stinson, DO, 4 mg at 05/09/16 2019 .  prochlorperazine (COMPAZINE) injection 10 mg, 10 mg, Intravenous, Q4H PRN, Rhona Raider Stinson, DO, 10 mg at 05/08/16 1637 .  senna-docusate (Senokot-S) tablet 1 tablet, 1 tablet, Oral, QHS PRN, Rhona Raider Stinson, DO .  traMADol Janean Sark) tablet 50 mg, 50 mg, Oral, Q6H PRN, Leda Gauze, NP, 50 mg at 05/10/16 0058 .  zolpidem (AMBIEN) tablet 5 mg, 5 mg, Oral, QHS PRN, Roma Kayser Schorr, NP, 5 mg at 05/07/16 2128  Patients Current Diet: Diet Carb Modified Fluid consistency: Thin; Room service appropriate? Yes Diet - low sodium heart healthy  Precautions / Restrictions Precautions Precautions: Fall Restrictions Weight Bearing Restrictions: No   Has the patient had 2 or more falls or a fall with injury in the past year?No, though did sustain a fall out of bed with onset of this stroke  Prior Activity Level Community (5-7x/wk): Per pt's husband, she is out of the home 5 times per week.  Pt. and husband go shopping, running errands and out to eat frequently.  Pt. was able to walk around [place] without need for scooter or other assistive device  Home Assistive Devices / Equipment Home Equipment: Walker - 2 wheels  Prior Device Use: Indicate devices/aids used by the patient prior to current illness, exacerbation or injury?  None of the above  Prior Functional Level Prior Function Level of Independence: Independent  Self Care: Did the patient need help  bathing, dressing, using the toilet or eating?  Independent  Indoor Mobility: Did the patient need assistance with walking from room to room (with or without device)? Independent  Stairs: Did the patient need assistance with internal or external stairs (with or without  device)? Independent  Functional Cognition: Did the patient need help planning regular tasks such as shopping or remembering to take medications? Needed some help with filling her pill box  Current Functional Level Cognition  Arousal/Alertness: Awake/alert Overall Cognitive Status: Impaired/Different from baseline Current Attention Level: Selective (Inattention to LUE) Orientation Level: Oriented X4 Following Commands: Follows one step commands with increased time General Comments: remains slow to initiate; perseverated on heel raising exercise Attention: Sustained Sustained Attention: Appears intact Memory: Impaired Memory Impairment: Storage deficit, Retrieval deficit, Decreased recall of new information (1/5 on MOCA) Awareness:  (? baseline vs new) Problem Solving: Impaired Problem Solving Impairment: Verbal complex Behaviors:  (flat affect) Safety/Judgment: Impaired    Extremity Assessment (includes Sensation/Coordination)  Upper Extremity Assessment: LUE deficits/detail LUE Deficits / Details: grossly 3+/5. decreased grip strength. Impaired fine/gross motor coordination. Pt reports sensation intact.  Lower Extremity Assessment: LLE deficits/detail LLE Deficits / Details: weakness noted LLE, 3-/5 gross motions, 2/5 dorsiflexion LLE Coordination: decreased fine motor, decreased gross motor    ADLs  Overall ADL's : Needs assistance/impaired Eating/Feeding: NPO Grooming: Minimal assistance, Sitting Upper Body Bathing: Moderate assistance, Sitting Lower Body Bathing: Maximal assistance, Sit to/from stand Upper Body Dressing : Moderate assistance, Sitting Upper Body Dressing Details (indicate cue type and  reason): to don/doff hospital gown Lower Body Dressing: Maximal assistance, Sit to/from stand Toilet Transfer: Minimal assistance, +2 for physical assistance, Stand-pivot Toilet Transfer Details (indicate cue type and reason): Simulated by sit to stand from EOB with few steps. Functional mobility during ADLs: Minimal assistance, +2 for physical assistance, Rolling walker (for few steps only) General ADL Comments: Began education in UB dressing using compensatory strategies while seated EOB with front opening gown.    Mobility  Overal bed mobility: Needs Assistance Bed Mobility: Rolling, Sidelying to Sit, Sit to Sidelying Rolling: Supervision, Min assist (supervision toward R, min to L) Sidelying to sit: Mod assist Supine to sit: Min assist Sit to sidelying: Min assist General bed mobility comments: assist to guide L LE and to raise trunk, cues for technique, assist for LEs back into bed    Transfers  Overall transfer level: Needs assistance Equipment used: Rolling walker (2 wheeled) Transfers: Sit to/from Stand Sit to Stand: Min assist Stand pivot transfers: Mod assist General transfer comment: repeated vc for safe technique with RW; assist to grasp RW with Lt hand and for balance (lt lean    Ambulation / Gait / Stairs / Wheelchair Mobility  Ambulation/Gait Ambulation/Gait assistance: +2 physical assistance, +2 safety/equipment, Mod assist Ambulation Distance (Feet): 6 Feet (sit rest; 8) Assistive device: Rolling walker (2 wheeled) Gait Pattern/deviations: Step-through pattern, Decreased step length - right, Decreased dorsiflexion - left, Decreased weight shift to right, Steppage, Ataxic, Narrow base of support General Gait Details: assist to weight shift Rt to unweight LLE and allow her to advance LLE; near scissoring with LLE (very narrow BOS) resulting in mulitple significant LOB requiring mod assist to recover Gait velocity: decreased Gait velocity interpretation: <1.8 ft/sec,  indicative of risk for recurrent falls    Posture / Balance Balance Overall balance assessment: Needs assistance Sitting-balance support: Feet supported, Bilateral upper extremity supported Sitting balance-Leahy Scale: Fair Postural control: Left lateral lean Standing balance support: Bilateral upper extremity supported Standing balance-Leahy Scale: Poor Standing balance comment: leans to her left; can identify she is left of center, but requires assist to correct    Special needs/care consideration BiPAP/CPAP  no CPM   no Continuous Drip IV   no Dialysis  no       Life Vest   no Oxygen     no Special Bed   no Trach Size   n/a Wound Vac (area) no      Skin  WDL per nursing assessment                            Bowel mgmt:last BM 05/08/16 continent Bladder mgmt: continent Diabetic mgmt on Januvia and basaglar insulin at home     Previous Home Environment Living Arrangements: Spouse/significant other  Lives With: Spouse Available Help at Discharge: Family, Available 24 hours/day Type of Home: House Home Layout: One level Home Access: Stairs to enter Entrance Stairs-Rails: None Entrance Stairs-Number of Steps: 2-3 Bathroom Shower/Tub: Associate ProfessorTub/shower unit Bathroom Toilet: Standard Bathroom Accessibility: Yes How Accessible: Accessible via wheelchair, Accessible via walker Home Care Services: No  Discharge Living Setting Plans for Discharge Living Setting: Patient's home Type of Home at Discharge: House Discharge Home Layout: One level Discharge Home Access: Stairs to enter Entrance Stairs-Rails: None Entrance Stairs-Number of Steps: 2-3 Discharge Bathroom Shower/Tub: Tub/shower unit Discharge Bathroom Toilet: Standard Discharge Bathroom Accessibility: Yes How Accessible: Accessible via wheelchair, Accessible via walker Does the patient have any problems obtaining your medications?: No  Social/Family/Support Systems Patient Roles: Spouse, Parent (grandparent of 2  granddaughters) Anticipated Caregiver: Arther DamesGarland Doody, husband Anticipated Caregiver's Contact Information: [phone] Ability/Limitations of Caregiver: Arther DamesGarland Dilger presently works as a part of a Systems developerskeleton crew at a business that has shut down.  We discussed that pt. is anticipated to need 24/7 care following and IP Rehab admission.  He says he will do what is needed to care for his wife, up to and including retiring from his job Caregiver Availability: 24/7 Discharge Plan Discussed with Primary Caregiver: Yes Is Caregiver In Agreement with Plan?: Yes Does Caregiver/Family have Issues with Lodging/Transportation while Pt is in Rehab?: No   Goals/Additional Needs Patient/Family Goal for Rehab: supervision and min assist  Expected length of stay: 10-14 days Cultural Considerations: none Dietary Needs: carb modified, dysphagia 3, thin liquids, no straws Equipment Needs: TBA Pt/Family Agrees to Admission and willing to participate: Yes Program Orientation Provided & Reviewed with Pt/Caregiver Including Roles  & Responsibilities: Yes   Decrease burden of Care through IP rehab admission: n/a   Possible need for SNF placement upon discharge:  Not anticipated   Patient Condition: This patient's condition remains as documented in the consult dated 05/08/16 , in which the Rehabilitation Physician determined and documented that the patient's condition is appropriate for intensive rehabilitative care in an inpatient rehabilitation facility.  Pt.'s functional baseline is that she is able to walk independently without assistive device community distances (able [place] [employer] [place]  Additionally, pt's husband has committed to caring for pt. 24/7 and says he will take early retirement if necessary.  Will admit to inpatient rehab today.   Preadmission Screen Completed By:  Weldon PickingBLANKENSHIP, Michaelangelo Mittelman, 05/10/2016 11:35 AM ______________________________________________________________________    Discussed status with Dr.  Judee Claraorum on 05/10/16 at  1134  and received telephone approval for admission today.  Admission Coordinator:  Weldon PickingBLANKENSHIP, Macintyre Alexa, time 1134 Dorna Bloom/Date 05/10/16

## 2016-05-10 ENCOUNTER — Encounter (HOSPITAL_COMMUNITY): Payer: Self-pay | Admitting: *Deleted

## 2016-05-10 ENCOUNTER — Inpatient Hospital Stay (HOSPITAL_COMMUNITY)
Admission: RE | Admit: 2016-05-10 | Discharge: 2016-05-27 | DRG: 092 | Disposition: A | Discharge status: Home Health | Payer: Medicare Other | Source: Intra-hospital | Attending: Physical Medicine & Rehabilitation | Admitting: Physical Medicine & Rehabilitation

## 2016-05-10 DIAGNOSIS — R2689 Other abnormalities of gait and mobility: Principal | ICD-10-CM | POA: Diagnosis present

## 2016-05-10 DIAGNOSIS — W19XXXD Unspecified fall, subsequent encounter: Secondary | ICD-10-CM | POA: Diagnosis present

## 2016-05-10 DIAGNOSIS — I69922 Dysarthria following unspecified cerebrovascular disease: Secondary | ICD-10-CM

## 2016-05-10 DIAGNOSIS — E1149 Type 2 diabetes mellitus with other diabetic neurological complication: Secondary | ICD-10-CM | POA: Diagnosis not present

## 2016-05-10 DIAGNOSIS — R4189 Other symptoms and signs involving cognitive functions and awareness: Secondary | ICD-10-CM | POA: Diagnosis present

## 2016-05-10 DIAGNOSIS — K5900 Constipation, unspecified: Secondary | ICD-10-CM | POA: Diagnosis not present

## 2016-05-10 DIAGNOSIS — I69354 Hemiplegia and hemiparesis following cerebral infarction affecting left non-dominant side: Secondary | ICD-10-CM

## 2016-05-10 DIAGNOSIS — E119 Type 2 diabetes mellitus without complications: Secondary | ICD-10-CM

## 2016-05-10 DIAGNOSIS — K219 Gastro-esophageal reflux disease without esophagitis: Secondary | ICD-10-CM | POA: Diagnosis present

## 2016-05-10 DIAGNOSIS — F17210 Nicotine dependence, cigarettes, uncomplicated: Secondary | ICD-10-CM | POA: Diagnosis present

## 2016-05-10 DIAGNOSIS — F419 Anxiety disorder, unspecified: Secondary | ICD-10-CM | POA: Diagnosis present

## 2016-05-10 DIAGNOSIS — E1165 Type 2 diabetes mellitus with hyperglycemia: Secondary | ICD-10-CM | POA: Diagnosis present

## 2016-05-10 DIAGNOSIS — I639 Cerebral infarction, unspecified: Secondary | ICD-10-CM

## 2016-05-10 DIAGNOSIS — I10 Essential (primary) hypertension: Secondary | ICD-10-CM | POA: Diagnosis present

## 2016-05-10 DIAGNOSIS — I69959 Hemiplegia and hemiparesis following unspecified cerebrovascular disease affecting unspecified side: Secondary | ICD-10-CM

## 2016-05-10 DIAGNOSIS — R51 Headache: Secondary | ICD-10-CM

## 2016-05-10 DIAGNOSIS — I69319 Unspecified symptoms and signs involving cognitive functions following cerebral infarction: Secondary | ICD-10-CM | POA: Diagnosis not present

## 2016-05-10 DIAGNOSIS — I6990 Unspecified sequelae of unspecified cerebrovascular disease: Secondary | ICD-10-CM

## 2016-05-10 DIAGNOSIS — F329 Major depressive disorder, single episode, unspecified: Secondary | ICD-10-CM | POA: Diagnosis present

## 2016-05-10 DIAGNOSIS — I69322 Dysarthria following cerebral infarction: Secondary | ICD-10-CM

## 2016-05-10 DIAGNOSIS — N289 Disorder of kidney and ureter, unspecified: Secondary | ICD-10-CM | POA: Diagnosis not present

## 2016-05-10 DIAGNOSIS — Z8249 Family history of ischemic heart disease and other diseases of the circulatory system: Secondary | ICD-10-CM | POA: Diagnosis not present

## 2016-05-10 DIAGNOSIS — N179 Acute kidney failure, unspecified: Secondary | ICD-10-CM | POA: Diagnosis not present

## 2016-05-10 DIAGNOSIS — F4323 Adjustment disorder with mixed anxiety and depressed mood: Secondary | ICD-10-CM

## 2016-05-10 DIAGNOSIS — R110 Nausea: Secondary | ICD-10-CM | POA: Insufficient documentation

## 2016-05-10 DIAGNOSIS — IMO0002 Reserved for concepts with insufficient information to code with codable children: Secondary | ICD-10-CM

## 2016-05-10 DIAGNOSIS — I63511 Cerebral infarction due to unspecified occlusion or stenosis of right middle cerebral artery: Secondary | ICD-10-CM | POA: Diagnosis present

## 2016-05-10 DIAGNOSIS — E1151 Type 2 diabetes mellitus with diabetic peripheral angiopathy without gangrene: Secondary | ICD-10-CM | POA: Diagnosis present

## 2016-05-10 DIAGNOSIS — R52 Pain, unspecified: Secondary | ICD-10-CM | POA: Diagnosis not present

## 2016-05-10 DIAGNOSIS — M533 Sacrococcygeal disorders, not elsewhere classified: Secondary | ICD-10-CM | POA: Diagnosis present

## 2016-05-10 DIAGNOSIS — I6350 Cerebral infarction due to unspecified occlusion or stenosis of unspecified cerebral artery: Secondary | ICD-10-CM

## 2016-05-10 DIAGNOSIS — D62 Acute posthemorrhagic anemia: Secondary | ICD-10-CM | POA: Diagnosis present

## 2016-05-10 DIAGNOSIS — G8194 Hemiplegia, unspecified affecting left nondominant side: Secondary | ICD-10-CM | POA: Diagnosis not present

## 2016-05-10 DIAGNOSIS — Z885 Allergy status to narcotic agent status: Secondary | ICD-10-CM

## 2016-05-10 DIAGNOSIS — I69351 Hemiplegia and hemiparesis following cerebral infarction affecting right dominant side: Secondary | ICD-10-CM | POA: Diagnosis not present

## 2016-05-10 DIAGNOSIS — E1159 Type 2 diabetes mellitus with other circulatory complications: Secondary | ICD-10-CM

## 2016-05-10 DIAGNOSIS — Z7982 Long term (current) use of aspirin: Secondary | ICD-10-CM | POA: Diagnosis not present

## 2016-05-10 DIAGNOSIS — I6930 Unspecified sequelae of cerebral infarction: Secondary | ICD-10-CM

## 2016-05-10 DIAGNOSIS — Z880 Allergy status to penicillin: Secondary | ICD-10-CM | POA: Diagnosis not present

## 2016-05-10 DIAGNOSIS — Z79899 Other long term (current) drug therapy: Secondary | ICD-10-CM

## 2016-05-10 DIAGNOSIS — E785 Hyperlipidemia, unspecified: Secondary | ICD-10-CM | POA: Diagnosis present

## 2016-05-10 DIAGNOSIS — E11649 Type 2 diabetes mellitus with hypoglycemia without coma: Secondary | ICD-10-CM | POA: Diagnosis not present

## 2016-05-10 DIAGNOSIS — Z794 Long term (current) use of insulin: Secondary | ICD-10-CM

## 2016-05-10 DIAGNOSIS — R519 Headache, unspecified: Secondary | ICD-10-CM

## 2016-05-10 DIAGNOSIS — I69919 Unspecified symptoms and signs involving cognitive functions following unspecified cerebrovascular disease: Secondary | ICD-10-CM

## 2016-05-10 LAB — URINALYSIS, ROUTINE W REFLEX MICROSCOPIC
Bacteria, UA: NONE SEEN
Bilirubin Urine: NEGATIVE
Glucose, UA: >=500 mg/dL — AB
Ketones, ur: 20 mg/dL — AB
Leukocytes, UA: NEGATIVE
Nitrite: NEGATIVE
Protein, ur: 100 mg/dL — AB
Specific Gravity, Urine: 1.029 (ref 1.005–1.030)
pH: 5 (ref 5.0–8.0)

## 2016-05-10 LAB — GLUCOSE, CAPILLARY
Glucose-Capillary: 146 mg/dL — ABNORMAL HIGH (ref 65–99)
Glucose-Capillary: 291 mg/dL — ABNORMAL HIGH (ref 65–99)
Glucose-Capillary: 180 mg/dL — ABNORMAL HIGH (ref 65–99)
Glucose-Capillary: 139 mg/dL — ABNORMAL HIGH (ref 65–99)

## 2016-05-10 MED ORDER — TRAMADOL HCL 50 MG PO TABS
50.0000 mg | ORAL_TABLET | Freq: Four times a day (QID) | ORAL | Status: DC | PRN
  Administered 2016-05-10 – 2016-05-27 (×29): 50 mg via ORAL
  Filled 2016-05-10 (×31): qty 1

## 2016-05-10 MED ORDER — PROMETHAZINE HCL 25 MG RE SUPP
25.0000 mg | Freq: Four times a day (QID) | RECTAL | Status: DC | PRN
  Administered 2016-05-10: 25 mg via RECTAL
  Filled 2016-05-10: qty 1

## 2016-05-10 MED ORDER — ATORVASTATIN CALCIUM 40 MG PO TABS
40.0000 mg | ORAL_TABLET | Freq: Every day | ORAL | Status: DC
  Administered 2016-05-10 – 2016-05-26 (×16): 40 mg via ORAL
  Filled 2016-05-10 (×14): qty 1

## 2016-05-10 MED ORDER — PANTOPRAZOLE SODIUM 40 MG PO TBEC
40.0000 mg | DELAYED_RELEASE_TABLET | Freq: Every day | ORAL | Status: DC
  Administered 2016-05-11 – 2016-05-18 (×8): 40 mg via ORAL
  Filled 2016-05-10 (×8): qty 1

## 2016-05-10 MED ORDER — PROCHLORPERAZINE MALEATE 5 MG PO TABS
5.0000 mg | ORAL_TABLET | Freq: Four times a day (QID) | ORAL | Status: DC | PRN
  Administered 2016-05-18: 10 mg via ORAL
  Administered 2016-05-19 – 2016-05-26 (×2): 5 mg via ORAL
  Filled 2016-05-10 (×2): qty 1
  Filled 2016-05-10: qty 2

## 2016-05-10 MED ORDER — INSULIN GLARGINE 100 UNIT/ML ~~LOC~~ SOLN
18.0000 [IU] | Freq: Every day | SUBCUTANEOUS | Status: DC
  Administered 2016-05-11 – 2016-05-12 (×2): 18 [IU] via SUBCUTANEOUS
  Filled 2016-05-10 (×2): qty 0.18

## 2016-05-10 MED ORDER — INSULIN ASPART 100 UNIT/ML ~~LOC~~ SOLN
0.0000 [IU] | Freq: Three times a day (TID) | SUBCUTANEOUS | Status: DC
  Administered 2016-05-11: 15 [IU] via SUBCUTANEOUS
  Administered 2016-05-11: 2 [IU] via SUBCUTANEOUS
  Administered 2016-05-11: 8 [IU] via SUBCUTANEOUS
  Administered 2016-05-12: 2 [IU] via SUBCUTANEOUS
  Administered 2016-05-12: 15 [IU] via SUBCUTANEOUS
  Administered 2016-05-12: 8 [IU] via SUBCUTANEOUS
  Administered 2016-05-13: 3 [IU] via SUBCUTANEOUS
  Administered 2016-05-13 – 2016-05-14 (×2): 5 [IU] via SUBCUTANEOUS
  Administered 2016-05-14: 3 [IU] via SUBCUTANEOUS
  Administered 2016-05-14 – 2016-05-15 (×2): 11 [IU] via SUBCUTANEOUS
  Administered 2016-05-15: 5 [IU] via SUBCUTANEOUS
  Administered 2016-05-15: 3 [IU] via SUBCUTANEOUS
  Administered 2016-05-16: 5 [IU] via SUBCUTANEOUS
  Administered 2016-05-16: 11 [IU] via SUBCUTANEOUS
  Administered 2016-05-17: 2 [IU] via SUBCUTANEOUS
  Administered 2016-05-18 – 2016-05-19 (×2): 5 [IU] via SUBCUTANEOUS
  Administered 2016-05-20: 3 [IU] via SUBCUTANEOUS
  Administered 2016-05-20: 15 [IU] via SUBCUTANEOUS
  Administered 2016-05-21 (×2): 5 [IU] via SUBCUTANEOUS
  Administered 2016-05-22: 8 [IU] via SUBCUTANEOUS

## 2016-05-10 MED ORDER — SENNOSIDES-DOCUSATE SODIUM 8.6-50 MG PO TABS
1.0000 | ORAL_TABLET | Freq: Every evening | ORAL | Status: DC | PRN
  Administered 2016-05-15 – 2016-05-19 (×3): 1 via ORAL
  Filled 2016-05-10 (×4): qty 1

## 2016-05-10 MED ORDER — PROCHLORPERAZINE EDISYLATE 5 MG/ML IJ SOLN: 5.0000 mg | Freq: Four times a day (QID) | INTRAMUSCULAR | Status: DC | PRN

## 2016-05-10 MED ORDER — CLOPIDOGREL BISULFATE 75 MG PO TABS
75.0000 mg | ORAL_TABLET | Freq: Every day | ORAL | Status: DC
  Administered 2016-05-11 – 2016-05-27 (×17): 75 mg via ORAL
  Filled 2016-05-10 (×17): qty 1

## 2016-05-10 MED ORDER — ACETAMINOPHEN 325 MG PO TABS
325.0000 mg | ORAL_TABLET | ORAL | Status: DC | PRN
  Administered 2016-05-14 – 2016-05-26 (×8): 650 mg via ORAL
  Filled 2016-05-10 (×9): qty 2

## 2016-05-10 MED ORDER — INSULIN ASPART 100 UNIT/ML ~~LOC~~ SOLN
0.0000 [IU] | Freq: Every day | SUBCUTANEOUS | Status: DC
  Administered 2016-05-11: 4 [IU] via SUBCUTANEOUS
  Administered 2016-05-14: 3 [IU] via SUBCUTANEOUS
  Administered 2016-05-15 – 2016-05-19 (×5): 2 [IU] via SUBCUTANEOUS

## 2016-05-10 MED ORDER — CLONAZEPAM 0.5 MG PO TABS
0.5000 mg | ORAL_TABLET | Freq: Two times a day (BID) | ORAL | Status: DC
  Administered 2016-05-10: 0.5 mg via ORAL
  Filled 2016-05-10: qty 1

## 2016-05-10 MED ORDER — DIPHENHYDRAMINE HCL 12.5 MG/5ML PO ELIX: 12.5000 mg | ORAL_SOLUTION | Freq: Four times a day (QID) | ORAL | Status: DC | PRN

## 2016-05-10 MED ORDER — CLONAZEPAM 0.5 MG PO TABS
0.5000 mg | ORAL_TABLET | Freq: Two times a day (BID) | ORAL | Status: DC
  Administered 2016-05-10 – 2016-05-27 (×34): 0.5 mg via ORAL
  Filled 2016-05-10 (×35): qty 1

## 2016-05-10 MED ORDER — PROCHLORPERAZINE 25 MG RE SUPP: 12.5000 mg | Freq: Four times a day (QID) | RECTAL | Status: DC | PRN

## 2016-05-10 MED ORDER — BUPROPION HCL ER (SR) 100 MG PO TB12
100.0000 mg | ORAL_TABLET | Freq: Every day | ORAL | Status: DC
  Administered 2016-05-11 – 2016-05-18 (×8): 100 mg via ORAL
  Filled 2016-05-10 (×9): qty 1

## 2016-05-10 MED ORDER — TRAZODONE HCL 50 MG PO TABS
25.0000 mg | ORAL_TABLET | Freq: Every evening | ORAL | Status: DC | PRN
Start: 2016-05-10 — End: 2016-05-27
  Administered 2016-05-11 – 2016-05-22 (×6): 50 mg via ORAL
  Filled 2016-05-10 (×7): qty 1

## 2016-05-10 MED ORDER — ENOXAPARIN SODIUM 40 MG/0.4ML ~~LOC~~ SOLN
40.0000 mg | SUBCUTANEOUS | Status: DC
  Administered 2016-05-11 – 2016-05-26 (×16): 40 mg via SUBCUTANEOUS
  Filled 2016-05-10 (×18): qty 0.4

## 2016-05-10 MED ORDER — BISACODYL 10 MG RE SUPP
10.0000 mg | Freq: Every day | RECTAL | Status: DC | PRN
  Administered 2016-05-10 – 2016-05-21 (×3): 10 mg via RECTAL
  Filled 2016-05-10 (×3): qty 1

## 2016-05-10 MED ORDER — LINAGLIPTIN 5 MG PO TABS
5.0000 mg | ORAL_TABLET | Freq: Every day | ORAL | Status: DC
  Administered 2016-05-11 – 2016-05-27 (×16): 5 mg via ORAL
  Filled 2016-05-10 (×17): qty 1

## 2016-05-10 MED ORDER — FLEET ENEMA 7-19 GM/118ML RE ENEM: 1.0000 | ENEMA | Freq: Once | RECTAL | Status: DC | PRN

## 2016-05-10 MED ORDER — GUAIFENESIN-DM 100-10 MG/5ML PO SYRP: 5.0000 mL | ORAL_SOLUTION | Freq: Four times a day (QID) | ORAL | Status: DC | PRN

## 2016-05-10 MED ORDER — ASPIRIN EC 325 MG PO TBEC
325.0000 mg | DELAYED_RELEASE_TABLET | Freq: Every day | ORAL | Status: DC
  Administered 2016-05-11 – 2016-05-27 (×17): 325 mg via ORAL
  Filled 2016-05-10 (×17): qty 1

## 2016-05-10 MED ORDER — ALUM & MAG HYDROXIDE-SIMETH 200-200-20 MG/5ML PO SUSP
30.0000 mL | ORAL | Status: DC | PRN
  Administered 2016-05-13 – 2016-05-24 (×4): 30 mL via ORAL
  Filled 2016-05-10 (×4): qty 30

## 2016-05-10 NOTE — Progress Notes (Signed)
Pt is being discharged to Inpatient Rehab (CIR) per orders from MD. Pt and family educated and aware of transfer. All questions and concerns were addressed. Pt's IV has been removed before discharge. Pt transferred to new unit via bed

## 2016-05-10 NOTE — Care Management Note (Signed)
Case Management Note  Patient Details  Name: Chloe Owens MRN: [id number] Date of Birth: 19-Mar-1956  Subjective/Objective:                    Action/Plan: Pt discharging to CIR today. No further needs per CM.   Expected Discharge Date:                  Expected Discharge Plan:  IP Rehab Facility  In-House Referral:     Discharge planning Services     Post Acute Care Choice:    Choice offered to:     DME Arranged:    DME Agency:     HH Arranged:    HH Agency:     Status of Service:  Completed, signed off  If discussed at [employer] Length of Stay Meetings, dates discussed:    Additional Comments:  Kermit BaloKelli F Jassmin Kemmerer, RN 05/10/2016, 2:04 PM

## 2016-05-10 NOTE — Progress Notes (Signed)
[facility] Admission Coordinator Signed Physical Medicine and Rehabilitation  PMR Pre-admission Date of Service: 05/09/2016 2:52 PM  Related encounter: Admission (Discharged) from 05/07/2016 in [facility] 4M NEURO MEDICAL       [] Hide copied text PMR Admission Coordinator Pre-Admission Assessment  [name] is an 60 y.o., female MRN: [id number] DOB: [birth year] Height:   Weight:                                                                                                                                                    Insurance Information HMO:     PPO:      PCP:      IPA:      80/20:      OTHER:  PRIMARY: Medicare A and B      Policy#:  [id number]      Subscriber:  self CM Name:        Phone#:      Fax#:  Pre-Cert#:       Employer: disabled; formerly employed by [employer]:  Phone #:       Name:  Eff. Date: Part A 11/04/10; Part B 06/06/11     Deduct:  $1316      Out of Pocket Max:  n/a      Life Max:  n/a CIR:  100% after deductible satisfied      SNF:  $0 days 1-20 Outpatient:  80%     Co-Pay:  20% Home Health:  100%      Co-Pay:  DME:  80%     Co-Pay:  20% Providers:  Pt. choice SECONDARY:  [employer] State [employer] Plan      Policy#:  [id number]      Subscriber:  self CM Name:       Phone#:      Fax#:  Pre-Cert#:       Employer:  Benefits:  Phone #:      Name:  Eff. Date:      Deduct:       Out of Pocket Max:       Life Max:  CIR:       SNF:  Outpatient:      Co-Pay:  Home Health:       Co-Pay:  DME:      Co-Pay:   Medicaid Application Date:       Case Manager:  Disability Application Date:       Case Worker:   Emergency Contact Information        Contact Information    Name Relation Home Work Mobile   Chloe Owens Spouse [phone]  [phone]   [place], [place] [phone]  [phone]     Current Medical History  Patient Admitting Diagnosis: RIGHT MCA territory infarct History of  Present Illness: Chloe Owens  is a 60 year old female with history of T2DM, HTN, BUE tremors, 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 MRI/MRA brain done revealing acute RIGHT MCA lenticulostriate territory infarct affecting the lentiform nucleus and posterior limb internal capsule, advanced atrophy and SVD, severe bilateral PCA disease. 2D echo with EF 65-70%, moderate LVH, aortic sclerosis without stenosis and heavily calcified MV with mild regurgitation could be source of embolic stroke. Carotid dopplers without significant ICA stenosis. 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 PT/OT evaluations done revealing LUE weakness with poor coordination, balance deficits, question of visual deficits and patient requires cues for sequencing with tasks. CIR recommended for follow up therapy  Total: 7 NIH    Past Medical History      Past Medical History:  Diagnosis Date  . Anxiety   . Bladder infection    hx of  . Depression   . Diabetes mellitus without complication (HCC)   . GERD (gastroesophageal reflux disease)   . Hyperlipidemia   . Hypertension   . Neuromuscular disorder (HCC)   . Stroke [facility](HCC)    05/2008    Family History  family history includes CAD in her father and mother; Cancer - Other in her brother and mother.  Prior Rehab/Hospitalizations:  Has the patient had major surgery during 100 days prior to admission? No  Current Medications   Current Facility-Administered Medications:  .   stroke: mapping our early stages of recovery book, , Does not apply, Once, Levie HeritageJacob J Stinson, DO .  acetaminophen (TYLENOL) tablet 650 mg, 650 mg, Oral, Q6H PRN, Leda GauzeKaren J Kirby-Graham, NP, 650 mg at 05/09/16 2036 .  [DISCONTINUED] aspirin suppository 300 mg, 300 mg, Rectal, Daily, 300 mg at 05/07/16 1648 **OR**  aspirin tablet 325 mg, 325 mg, Oral, Daily, Rhona RaiderJacob J Stinson, DO, 325 mg at 05/10/16 0941 .  atorvastatin (LIPITOR) tablet 40 mg, 40 mg, Oral, q1800, Marvel PlanJindong Xu, MD, 40 mg at 05/09/16 1722 .  clonazePAM (KLONOPIN) tablet 0.5 mg, 0.5 mg, Oral, BID, Richarda OverlieNayana Abrol, MD, 0.5 mg at 05/10/16 0941 .  clopidogrel (PLAVIX) tablet 75 mg, 75 mg, Oral, Daily, Marvel PlanJindong Xu, MD, 75 mg at 05/10/16 0941 .  enoxaparin (LOVENOX) injection 40 mg, 40 mg, Subcutaneous, Q24H, Rhona RaiderJacob J Stinson, DO, 40 mg at 05/10/16 0941 .  insulin aspart (novoLOG) injection 0-15 Units, 0-15 Units, Subcutaneous, TID WC, Rhona RaiderJacob J Stinson, DO, 3 Units at 05/10/16 [phone] .  insulin aspart (novoLOG) injection 0-5 Units, 0-5 Units, Subcutaneous, QHS, Rhona RaiderJacob J StedmanStinson, DO, 2 Units at 05/08/16 2203 .  insulin glargine (LANTUS) injection 18 Units, 18 Units, Subcutaneous, Daily, Richarda OverlieNayana Abrol, MD, 18 Units at 05/10/16 0941 .  ondansetron [facility](ZOFRAN) injection 4 mg, 4 mg, Intravenous, Q6H PRN, Rhona RaiderJacob J Stinson, DO, 4 mg at 05/09/16 2019 .  prochlorperazine (COMPAZINE) injection 10 mg, 10 mg, Intravenous, Q4H PRN, Rhona RaiderJacob J Stinson, DO, 10 mg at 05/08/16 1637 .  senna-docusate (Senokot-S) tablet 1 tablet, 1 tablet, Oral, QHS PRN, Rhona RaiderJacob J Stinson, DO .  traMADol Janean Sark(ULTRAM) tablet 50 mg, 50 mg, Oral, Q6H PRN, Leda GauzeKaren J Kirby-Graham, NP, 50 mg at 05/10/16 0058 .  zolpidem (AMBIEN) tablet 5 mg, 5 mg, Oral, QHS PRN, Roma KayserKatherine P Schorr, NP, 5 mg at 05/07/16 2128  Patients Current Diet: Diet Carb Modified Fluid consistency: Thin; Room service appropriate? Yes Diet - low sodium heart healthy  Precautions / Restrictions Precautions Precautions: Fall Restrictions Weight Bearing Restrictions: No   Has the patient had 2 or more falls or a fall with injury in the past year?No, though did sustain a fall out of bed with onset of this stroke  Prior Activity Level Community (5-7x/wk): Per pt's husband, she is out of the home 5 times per week.  Pt. and husband go shopping,  running errands and out to eat frequently.  Pt. was able to walk around [place] without need for scooter or other assistive device  Home Assistive Devices / Equipment Home Equipment: Walker - 2 wheels  Prior Device Use: Indicate devices/aids used by the patient prior to current illness, exacerbation or injury?  None of the above  Prior Functional Level Prior Function Level of Independence: Independent  Self Care: Did the patient need help bathing, dressing, using the toilet or eating?  Independent  Indoor Mobility: Did the patient need assistance with walking from room to room (with or without device)? Independent  Stairs: Did the patient need assistance with internal or external stairs (with or without device)? Independent  Functional Cognition: Did the patient need help planning regular tasks such as shopping or remembering to take medications? Needed some help with filling her pill box  Current Functional Level Cognition Arousal/Alertness: Awake/alert Overall Cognitive Status: Impaired/Different from baseline Current Attention Level: Selective (Inattention to LUE) Orientation Level: Oriented X4 Following Commands: Follows one step commands with increased time General Comments: remains slow to initiate; perseverated on heel raising exercise Attention: Sustained Sustained Attention: Appears intact Memory: Impaired Memory Impairment: Storage deficit, Retrieval deficit, Decreased recall of new information (1/5 on MOCA) Awareness:  (? baseline vs new) Problem Solving: Impaired Problem Solving Impairment: Verbal complex Behaviors:  (flat affect) Safety/Judgment: Impaired    Extremity Assessment (includes Sensation/Coordination) Upper Extremity Assessment: LUE deficits/detail LUE Deficits / Details: grossly 3+/5. decreased grip strength. Impaired fine/gross motor coordination. Pt reports sensation intact.  Lower Extremity Assessment: LLE deficits/detail LLE Deficits /  Details: weakness noted LLE, 3-/5 gross motions, 2/5 dorsiflexion LLE Coordination: decreased fine motor, decreased gross motor   ADLs Overall ADL's : Needs assistance/impaired Eating/Feeding: NPO Grooming: Minimal assistance, Sitting Upper Body Bathing: Moderate assistance, Sitting Lower Body Bathing: Maximal assistance, Sit to/from stand Upper Body Dressing : Moderate assistance, Sitting Upper Body Dressing Details (indicate cue type and reason): to don/doff hospital gown Lower Body Dressing: Maximal assistance, Sit to/from stand Toilet Transfer: Minimal assistance, +2 for physical assistance, Stand-pivot Toilet Transfer Details (indicate cue type and reason): Simulated by sit to stand from EOB with few steps. Functional mobility during ADLs: Minimal assistance, +2 for physical assistance, Rolling walker (for few steps only) General ADL Comments: Began education in UB dressing using compensatory strategies while seated EOB with front opening gown.   Mobility Overal bed mobility: Needs Assistance Bed Mobility: Rolling, Sidelying to Sit, Sit to Sidelying Rolling: Supervision, Min assist (supervision toward R, min to L) Sidelying to sit: Mod assist Supine to sit: Min assist Sit to sidelying: Min assist General bed mobility comments: assist to guide L LE and to raise trunk, cues for technique, assist for LEs back into bed   Transfers Overall transfer level: Needs assistance Equipment used: Rolling walker (2 wheeled) Transfers: Sit to/from Stand Sit to Stand: Min assist Stand pivot transfers: Mod assist General transfer comment: repeated vc for safe technique with RW; assist to grasp RW with Lt hand and for balance (lt lean   Ambulation / Gait / Stairs / Wheelchair Mobility Ambulation/Gait  Ambulation/Gait assistance: +2 physical assistance, +2 safety/equipment, Mod assist Ambulation Distance (Feet): 6 Feet (sit rest; 8) Assistive device: Rolling walker (2 wheeled) Gait Pattern/deviations:  Step-through pattern, Decreased step length - right, Decreased dorsiflexion - left, Decreased weight shift to right, Steppage, Ataxic, Narrow base of support General Gait Details: assist to weight shift Rt to unweight LLE and allow her to advance LLE; near scissoring with LLE (very narrow BOS) resulting in mulitple significant LOB requiring mod assist to recover Gait velocity: decreased Gait velocity interpretation: <1.8 ft/sec, indicative of risk for recurrent falls   Posture / Balance Balance Overall balance assessment: Needs assistance Sitting-balance support: Feet supported, Bilateral upper extremity supported Sitting balance-Leahy Scale: Fair Postural control: Left lateral lean Standing balance support: Bilateral upper extremity supported Standing balance-Leahy Scale: Poor Standing balance comment: leans to her left; can identify she is left of center, but requires assist to correct   Special needs/care consideration BiPAP/CPAP  no CPM   no Continuous Drip IV   no Dialysis   no       Life Vest   no Oxygen     no Special Bed   no Trach Size   n/a Wound Vac (area) no      Skin  WDL per nursing assessment                            Bowel mgmt:last BM 05/08/16 continent Bladder mgmt: continent Diabetic mgmt on Januvia and basaglar insulin at home    Previous Home Environment Living Arrangements: Spouse/significant other  Lives With: Spouse Available Help at Discharge: Family, Available 24 hours/day Type of Home: House Home Layout: One level Home Access: Stairs to enter Entrance Stairs-Rails: None Entrance Stairs-Number of Steps: 2-3 Bathroom Shower/Tub: Associate Professor: Yes How Accessible: Accessible via wheelchair, Accessible via walker Home Care Services: No  Discharge Living Setting Plans for Discharge Living Setting: Patient's home Type of Home at Discharge: House Discharge Home Layout: One level Discharge Home  Access: Stairs to enter Entrance Stairs-Rails: None Entrance Stairs-Number of Steps: 2-3 Discharge Bathroom Shower/Tub: Tub/shower unit Discharge Bathroom Toilet: Standard Discharge Bathroom Accessibility: Yes How Accessible: Accessible via wheelchair, Accessible via walker Does the patient have any problems obtaining your medications?: No  Social/Family/Support Systems Patient Roles: Spouse, Parent (grandparent of 2 granddaughters) Anticipated Caregiver: Chamille Werntz, husband Anticipated Caregiver's Contact Information: [phone] Ability/Limitations of Caregiver: Lakesa Coste presently works as a part of a Systems developer at a business that has shut down.  We discussed that pt. is anticipated to need 24/7 care following and IP Rehab admission.  He says he will do what is needed to care for his wife, up to and including retiring from his job Caregiver Availability: 24/7 Discharge Plan Discussed with Primary Caregiver: Yes Is Caregiver In Agreement with Plan?: Yes Does Caregiver/Family have Issues with Lodging/Transportation while Pt is in Rehab?: No   Goals/Additional Needs Patient/Family Goal for Rehab: supervision and min assist  Expected length of stay: 10-14 days Cultural Considerations: none Dietary Needs: carb modified, dysphagia 3, thin liquids, no straws Equipment Needs: TBA Pt/Family Agrees to Admission and willing to participate: Yes Program Orientation Provided & Reviewed with Pt/Caregiver Including Roles  & Responsibilities: Yes   Decrease burden of Care through IP rehab admission: n/a   Possible need for SNF placement upon discharge:  Not anticipated   Patient Condition: This patient's condition remains as documented in the consult dated 05/08/16 ,  in which the Rehabilitation Physician determined and documented that the patient's condition is appropriate for intensive rehabilitative care in an inpatient rehabilitation facility.  Pt.'s functional baseline is  that she is able to walk independently without assistive device community distances (able [place] walk inside of large [employer] [place]  Additionally, pt's husband has committed to caring for pt. 24/7 and says he will take early retirement if necessary.  Will admit to inpatient rehab today.   Preadmission Screen Completed By:  Weldon PickingBLANKENSHIP, Hasna Stefanik, 05/10/2016 11:35 AM ______________________________________________________________________   Discussed status with Dr.  Judee Claraorum on 05/10/16 at  1134  and received telephone approval for admission today.  Admission Coordinator:  Weldon PickingBLANKENSHIP, Mikaia Janvier, time [id number] /Date 05/10/16       Cosigned by: Ankit Karis JubaAnil Patel, MD at 05/10/2016 12:07 PM  Revision History

## 2016-05-10 NOTE — Progress Notes (Signed)
Patient received at 1553 alert and oriented x 3. No complaint of pain. Oriented patient and husband to room and call bell system. Patient and family verbalized understanding of admission process. Continue with plan of care.  Chloe Owens, [name]

## 2016-05-10 NOTE — H&P (Signed)
Physical Medicine and Rehabilitation Admission H&P    CC: Left sided weakness, difficulty walking and slurred speech.    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 MRI/MRA  brain done revealing acute RIGHT MCA lenticulostriate territory infarct affecting the lentiform nucleus and posterior limb internal capsule, advanced atrophy and SVD, severe bilateral PCA disease. 2D echo with EF 65-70%, moderate LVH, aortic sclerosis without stenosis and heavily calcified MV with mild regurgitation could be source of embolic stroke. Carotid dopplers without significant ICA stenosis. 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  PT/OT evaluations done revealing LUE weakness with poor coordination, balance deficits, question of visual deficits and patient requires cues for sequencing with tasks. CIR recommended for follow up therapy   Review of Systems  HENT: Negative for hearing loss.   Eyes: Negative for blurred vision and double vision.  Respiratory: Negative for cough and shortness of breath.   Cardiovascular: Negative for chest pain and palpitations.  Gastrointestinal: Positive for nausea. Negative for heartburn.       Has history of hiccups--managed with haldol in the past  Genitourinary: Negative for dysuria and urgency.       Gets up 1-2 times at nights  Musculoskeletal: Negative for myalgias.  Skin: Negative for itching and rash.  Neurological: Positive for dizziness, focal weakness and weakness. Negative for speech change.  Psychiatric/Behavioral: Positive for memory loss. The patient is nervous/anxious and has insomnia.   All other systems reviewed and are negative.     Past Medical History:  Diagnosis Date  . Anxiety   . Bladder  infection    hx of  . Depression   . Diabetes mellitus without complication ([place])   . GERD (gastroesophageal reflux disease)   . Hyperlipidemia   . Hypertension   . Neuromuscular disorder ([place])   . Stroke [facility])    05/2008    Past Surgical History:  Procedure Laterality Date  . ANKLE CLOSED REDUCTION  04/15/2012   Procedure: CLOSED REDUCTION ANKLE;  Surgeon: Marin Shutter, MD;  Location: [place];  Service: Orthopedics;  Laterality: Left;  . CARPAL TUNNEL RELEASE     bilaterally  . ORIF ANKLE FRACTURE  05/09/2012   Procedure: OPEN REDUCTION INTERNAL FIXATION (ORIF) ANKLE FRACTURE;  Surgeon: Wylene Simmer, MD;  Location: [place];  Service: Orthopedics;  Laterality: Left;  ORIF VERSES EXFIX LEFT ANKLE FRACTURE    Family History  Problem Relation Age of Onset  . CAD Father   . CAD Mother   . Cancer - Other Mother     Breast  . Cancer - Other Brother     Unknown type    Social History:   Married. 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  Reports that she has been smoking Cigarettes.  She has been smoking about 1.5 pack per day. She has never used smokeless tobacco. She reports that she does not drink alcohol or use drugs.     Allergies  Allergen Reactions  . Codeine Nausea And Vomiting and Rash  . Penicillins Rash    Medications Prior to Admission  Medication Sig Dispense Refill  . ARIPiprazole (ABILIFY) 30 MG tablet Take 30 mg by mouth 2 (two) times daily.    Marland Kitchen  aspirin EC 325 MG tablet Take 1 tablet (325 mg total) by mouth daily. 42 tablet 0  . buPROPion (WELLBUTRIN SR) 150 MG 12 hr tablet Take 150 mg by mouth 2 (two) times daily.    . clindamycin (CLEOCIN) 150 MG capsule Take 150 mg by mouth 3 (three) times daily.    Marland Kitchen JANUVIA 100 MG tablet Take 100 mg by mouth every evening.    Marland Kitchen lisinopril (PRINIVIL,ZESTRIL) 20 MG tablet Take 20 mg by mouth daily.    Marland Kitchen omeprazole (PRILOSEC) 20 MG capsule Take 20 mg by mouth 2 (two) times daily.     .  simvastatin (ZOCOR) 40 MG tablet Take 40 mg by mouth every evening.    . [DISCONTINUED] clonazePAM (KLONOPIN) 1 MG tablet Take 1 mg by mouth 2 (two) times daily.     . [DISCONTINUED] Insulin Glargine (BASAGLAR KWIKPEN) 100 UNIT/ML SOPN Inject 45 Units into the skin every morning.    . ondansetron (ZOFRAN ODT) 4 MG disintegrating tablet Take 1 tablet (4 mg total) by mouth every 4 (four) hours as needed for nausea or vomiting. (Patient not taking: Reported on 05/07/2016) 20 tablet 0  . ondansetron (ZOFRAN) 4 MG tablet Take 1 tablet (4 mg total) by mouth every 6 (six) hours as needed for nausea. (Patient not taking: Reported on 05/07/2016) 20 tablet 0  . oxyCODONE (OXY IR/ROXICODONE) 5 MG immediate release tablet Take 1-2 tablets (5-10 mg total) by mouth every 3 (three) hours as needed. (Patient not taking: Reported on 05/07/2016) 50 tablet 0    Home: Home Living Family/patient expects to be discharged to:: Private residence Living Arrangements: Spouse/significant other Available Help at Discharge: Family, Available 24 hours/day Type of Home: House Home Access: Stairs to enter [employer] of Steps: 2-3 Entrance Stairs-Rails: None Home Layout: One level Bathroom Shower/Tub: Chiropodist: Standard Bathroom Accessibility: Yes Home Equipment: Environmental consultant - 2 wheels  Lives With: Spouse   Functional History: Prior Function Level of Independence: Independent  Functional Status:  Mobility: Bed Mobility Overal bed mobility: Needs Assistance Bed Mobility: Rolling, Sidelying to Sit, Sit to Sidelying Rolling: Supervision, Min assist (supervision toward R, min to L) Sidelying to sit: Mod assist Supine to sit: Min assist Sit to sidelying: Min assist General bed mobility comments: assist to guide L LE and to raise trunk, cues for technique, assist for LEs back into bed Transfers Overall transfer level: Needs assistance Equipment used: Rolling walker (2 wheeled) Transfers:  Sit to/from Stand Sit to Stand: Min assist Stand pivot transfers: Mod assist General transfer comment: repeated vc for safe technique with RW; assist to grasp RW with Lt hand and for balance (lt lean Ambulation/Gait Ambulation/Gait assistance: +2 physical assistance, +2 safety/equipment, Mod assist Ambulation Distance (Feet): 6 Feet (sit rest; 8) Assistive device: Rolling walker (2 wheeled) Gait Pattern/deviations: Step-through pattern, Decreased step length - right, Decreased dorsiflexion - left, Decreased weight shift to right, Steppage, Ataxic, Narrow base of support General Gait Details: assist to weight shift Rt to unweight LLE and allow her to advance LLE; near scissoring with LLE (very narrow BOS) resulting in mulitple significant LOB requiring mod assist to recover Gait velocity: decreased Gait velocity interpretation: <1.8 ft/sec, indicative of risk for recurrent falls    ADL: ADL Overall ADL's : Needs assistance/impaired Eating/Feeding: NPO Grooming: Minimal assistance, Sitting Upper Body Bathing: Moderate assistance, Sitting Lower Body Bathing: Maximal assistance, Sit to/from stand Upper Body Dressing : Moderate assistance, Sitting Upper Body Dressing Details (indicate cue type and reason): to  don/doff hospital gown Lower Body Dressing: Maximal assistance, Sit to/from stand Toilet Transfer: Minimal assistance, +2 for physical assistance, Stand-pivot Toilet Transfer Details (indicate cue type and reason): Simulated by sit to stand from EOB with few steps. Functional mobility during ADLs: Minimal assistance, +2 for physical assistance, Rolling walker (for few steps only) General ADL Comments: Began education in UB dressing using compensatory strategies while seated EOB with front opening gown.  Cognition: Cognition Overall Cognitive Status: Impaired/Different from baseline Arousal/Alertness: Awake/alert Orientation Level: Oriented X4 Attention: Sustained Sustained  Attention: Appears intact Memory: Impaired Memory Impairment: Storage deficit, Retrieval deficit, Decreased recall of new information (1/5 on MOCA) Awareness:  (? baseline vs new) Problem Solving: Impaired Problem Solving Impairment: Verbal complex Behaviors:  (flat affect) Safety/Judgment: Impaired Cognition Arousal/Alertness: Awake/alert Behavior During Therapy: Flat affect Overall Cognitive Status: Impaired/Different from baseline Area of Impairment: Following commands, Problem solving Current Attention Level: Selective (Inattention to LUE) Following Commands: Follows one step commands with increased time Problem Solving: Slow processing General Comments: remains slow to initiate; perseverated on heel raising exercise   Blood pressure 130/74, pulse 89, temperature 97.4 F (36.3 C), temperature source Oral, resp. rate 17, SpO2 94 %. Physical Exam  Nursing note and vitals reviewed. Constitutional: She is oriented to person, place, and time. She appears well-developed and well-nourished.  Anxious appearing female. Complaining of nausea and malaise.   HENT:  Head: Normocephalic and atraumatic.  Mouth/Throat: Oropharynx is clear and moist.  Eyes: Conjunctivae and EOM are normal. Pupils are equal, round, and reactive to light.  Neck: Normal range of motion. Neck supple.  Cardiovascular: Normal rate and regular rhythm.   No murmur heard. Respiratory: Effort normal and breath sounds normal. No stridor. She has no wheezes.  GI: Soft. Bowel sounds are normal. She exhibits no distension. There is no tenderness.  Musculoskeletal: She exhibits no edema or tenderness.  Neurological: She is alert and oriented to person, place, and time.  Left facial weakness with moderate dysarthria.  Able to state DOB, city, state.  Able to follow simple one and two step commands.  Intentional tremor RUE.  Motor: RUE/RLE: 4+/5 proximal to distal LUE/LLE: 4-/5 proximal to distal DTRs  symmetric Sensation intact to light touch   Skin: Skin is warm and dry.  Psychiatric: Her affect is blunt. Her speech is delayed. She is slowed. She exhibits abnormal recent memory and abnormal remote memory.    Results for orders placed or performed during the hospital encounter of 05/07/16 (from the past 48 hour(s))  Troponin I     Status: None   Collection Time: 05/08/16  3:14 PM  Result Value Ref Range   Troponin I <0.03 <0.03 ng/mL  Glucose, capillary     Status: Abnormal   Collection Time: 05/08/16  4:56 PM  Result Value Ref Range   Glucose-Capillary 104 (H) 65 - 99 mg/dL   Comment 1 Notify RN    Comment 2 Document in Chart   Glucose, capillary     Status: Abnormal   Collection Time: 05/08/16  9:28 PM  Result Value Ref Range   Glucose-Capillary 218 (H) 65 - 99 mg/dL   Comment 1 Notify RN    Comment 2 Document in Chart   CBC     Status: Abnormal   Collection Time: 05/09/16  4:08 AM  Result Value Ref Range   WBC 10.9 (H) 4.0 - 10.5 K/uL   RBC 4.51 3.87 - 5.11 MIL/uL   Hemoglobin 13.3 12.0 - 15.0 g/dL   HCT  38.7 36.0 - 46.0 %   MCV 85.8 78.0 - 100.0 fL   MCH 29.5 26.0 - 34.0 pg   MCHC 34.4 30.0 - 36.0 g/dL   RDW 12.4 11.5 - 15.5 %   Platelets 240 150 - 400 K/uL  Basic metabolic panel     Status: Abnormal   Collection Time: 05/09/16  4:08 AM  Result Value Ref Range   Sodium 138 135 - 145 mmol/L   Potassium 3.7 3.5 - 5.1 mmol/L   Chloride 101 101 - 111 mmol/L   CO2 26 22 - 32 mmol/L   Glucose, Bld 179 (H) 65 - 99 mg/dL   BUN 14 6 - 20 mg/dL   Creatinine, Ser 0.96 0.44 - 1.00 mg/dL   Calcium 9.5 8.9 - 10.3 mg/dL   GFR calc non Af Amer >60 >60 mL/min   GFR calc Af Amer >60 >60 mL/min    Comment: (NOTE) The eGFR has been calculated using the CKD EPI equation. This calculation has not been validated in all clinical situations. eGFR's persistently <60 mL/min signify possible Chronic Kidney Disease.    Anion gap 11 5 - 15  Glucose, capillary     Status: Abnormal    Collection Time: 05/09/16  6:17 AM  Result Value Ref Range   Glucose-Capillary 215 (H) 65 - 99 mg/dL   Comment 1 Notify RN    Comment 2 Document in Chart   Glucose, capillary     Status: Abnormal   Collection Time: 05/09/16 11:50 AM  Result Value Ref Range   Glucose-Capillary 249 (H) 65 - 99 mg/dL   Comment 1 Notify RN    Comment 2 Document in Chart   Glucose, capillary     Status: Abnormal   Collection Time: 05/09/16  4:59 PM  Result Value Ref Range   Glucose-Capillary 234 (H) 65 - 99 mg/dL   Comment 1 Notify RN    Comment 2 Document in Chart   Glucose, capillary     Status: None   Collection Time: 05/09/16  9:41 PM  Result Value Ref Range   Glucose-Capillary 84 65 - 99 mg/dL   Comment 1 Notify RN    Comment 2 Document in Chart   Glucose, capillary     Status: None   Collection Time: 05/09/16 10:16 PM  Result Value Ref Range   Glucose-Capillary 84 65 - 99 mg/dL  Glucose, capillary     Status: Abnormal   Collection Time: 05/10/16  6:36 AM  Result Value Ref Range   Glucose-Capillary 146 (H) 65 - 99 mg/dL   Comment 1 Notify RN    Comment 2 Document in Chart   Glucose, capillary     Status: Abnormal   Collection Time: 05/10/16 11:51 AM  Result Value Ref Range   Glucose-Capillary 291 (H) 65 - 99 mg/dL   Comment 1 Notify RN    Comment 2 Document in Chart    No results found.  Medical Problem List and Plan: 1.  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 2.  DVT Prophylaxis/Anticoagulation: Pharmaceutical: Lovenox 3. Pain Management: N/A  4. Mood: LCSW to follow for evaluation and support.  5. Neuropsych: This patient is not fully capable of making decisions on her own behalf. 6. Skin/Wound Care: routine pressure relief measures.  7. Fluids/Electrolytes/Nutrition: Monitor I/O. Check lytes in am. Offer supplements with meals.  8. HTN: Will monitor BP bid and allow for higher readings for perfusion. Lisinopril on hold.  9. T2DM:Monitor BS ac/hs. On Lantus 18 units daily with SSI for elevated BS--titrated as indicated. Resume Januvia.  10 Anxiety/depression: team to provide ego support. She was on Abilify, Klonopin and Wellbutrin at home. Klonopin resumed today. Will also resume low dose Wellbutrin. .   11. Dyslipidemia: On Lipitor.  12. Nausea:  Will resume Prilosec.  Anxiety may be a component. Mild leucocytosis noted--will check UA/UCS.    Post Admission Physician Evaluation: 1. Preadmission assessment reviewed and changes made below. 2. Functional deficits secondary  to right MCA infarct. 3. Patient is admitted to receive collaborative, interdisciplinary care between the physiatrist, rehab nursing staff, and therapy team. 4. Patient's level of medical complexity and substantial therapy needs in context of that medical necessity cannot be provided at a lesser intensity of care such as a SNF. 5. Patient has experienced substantial functional loss from his/her baseline which was documented above under the "Functional History" and "Functional Status" headings.  Judging by the patient's diagnosis, physical exam, and functional history, the patient has potential for functional progress which will result in measurable gains while on inpatient rehab.  These gains will be of substantial and practical use upon discharge  in facilitating mobility and self-care at the household level. 6. Physiatrist will provide 24 hour management of medical needs as well as oversight of the therapy plan/treatment and provide guidance as appropriate regarding the interaction of the two. 7. The Preadmission Screening has been reviewed and patient status is unchanged unless otherwise stated above. 8. 24 hour rehab nursing will assist with bladder management, bowel management, safety, skin/wound care, disease management, medication administration and patient education  and help integrate therapy concepts, techniques,education, etc. 9. PT will  assess and treat for/with: Lower extremity strength, range of motion, stamina, balance, functional mobility, safety, adaptive techniques and equipment, coping skills, pain control, stroke education.   Goals are: Supervision. 10. OT will assess and treat for/with: ADL's, functional mobility, safety, upper extremity strength, adaptive techniques and equipment, ego support, and community reintegration.   Goals are: Min A/Supervision. Therapy may proceed with showering this patient. 11. SLP will assess and treat for/with: cognition, speech.  Goals are: Mod/Min A. 12. Case Management and Social Worker will assess and treat for psychological issues and discharge planning. 78. Team conference will be held weekly to assess progress toward goals and to determine barriers to discharge. 14. Patient will receive at least 3 hours of therapy per day at least 5 days per week. 15. ELOS: 10-15 days.       16. Prognosis:  good   Delice Lesch, MD, Mellody Drown 05/10/2016

## 2016-05-10 NOTE — Progress Notes (Signed)
Ankit Karis Juba, MD Physician Signed Physical Medicine and Rehabilitation  Consult Note Date of Service: 05/08/2016 9:31 AM  Related encounter: Admission (Discharged) from 05/07/2016 in [facility] 55[facility]     Expand All Collapse All   [] Hide copied text      Physical Medicine and Rehabilitation Consult   Reason for Consult: Left sided weakness, slurred speech and difficulty walking.  Referring Physician: Dr. Susie Cassette   HPI: Chloe Owens is a 60 y.o. RH-female with history of HTN, T2DM, BUE tremor, depression, prior stroke with residual memory deficits who was admitted on 05/07/16 with slurred speech, difficulty walking and left sided weakness since evening prior to admission. MRI/MRA  brain done revealing acute RIGHT MCA lenticulostriate territory infarct affecting the lentiform nucleus and posterior limb internal capsule, advanced atrophy and SVD, severe bilateral PCA disease. 2D echo with EF 65-70%, moderate LVH, aortic sclerosis without stenosis and heavily calcified MV with mild regurgitation could be source of embolic stroke. Carotid dopplers without significant ICA stenosis. 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  PT/OT evaluations done revealing LUE weakness with poor coordination, balance deficits, question of visual deficits and patient requires cues for sequencing with tasks. CIR recommended for follow up therapy.   Sedentary PTA--she does a little housework but watches TV most of the day.  Husband manages finances, medications and does the driving. They eat out most evenings.  He works near by and can check in during the day.   Review of Systems  HENT: Negative for hearing loss.   Eyes: Negative for blurred vision and double vision.  Respiratory: Negative for cough and shortness of breath.   Cardiovascular: Negative for chest pain and palpitations.  Gastrointestinal: Positive for  heartburn. Negative for nausea and vomiting.  Genitourinary: Positive for urgency (gets up 1-2 times at night. ).  Musculoskeletal: Negative for back pain, myalgias and neck pain.  Skin: Negative for rash.  Neurological: Positive for dizziness, sensory change, speech change, focal weakness and weakness. Negative for headaches.  Psychiatric/Behavioral: Positive for memory loss. The patient has insomnia.   All other systems reviewed and are negative.         Past Medical History:  Diagnosis Date  . Anxiety   . Bladder infection    hx of  . Depression   . Diabetes mellitus without complication (HCC)   . GERD (gastroesophageal reflux disease)   . Hyperlipidemia   . Hypertension   . Neuromuscular disorder (HCC)   . Stroke [facility])    05/2008         Past Surgical History:  Procedure Laterality Date  . ANKLE CLOSED REDUCTION  04/15/2012   Procedure: CLOSED REDUCTION ANKLE;  Surgeon: Senaida Lange, MD;  Location: [facility] OR;  Service: Orthopedics;  Laterality: Left;  . CARPAL TUNNEL RELEASE     bilaterally  . ORIF ANKLE FRACTURE  05/09/2012   Procedure: OPEN REDUCTION INTERNAL FIXATION (ORIF) ANKLE FRACTURE;  Surgeon: Toni Arthurs, MD;  Location: [facility] OR;  Service: Orthopedics;  Laterality: Left;  ORIF VERSES EXFIX LEFT ANKLE FRACTURE          Family History  Problem Relation Age of Onset  . CAD Father   . CAD Mother   . Cancer - Other Mother     Breast  . Cancer - Other Brother     Unknown type    Social History:  Married. Independent  PTA--disabled since 2009.  Used to work as a Engineer, civil (consulting).  Reports that she has been smoking Cigarettes.  She has been smoking about 1.5 pack per day. She has never used smokeless tobacco. She reports that she does not drink alcohol or use drugs.        Allergies  Allergen Reactions  . Codeine Nausea And Vomiting and Rash  . Penicillins Rash          Medications Prior to Admission  Medication Sig  Dispense Refill  . ARIPiprazole (ABILIFY) 30 MG tablet Take 30 mg by mouth 2 (two) times daily.    Marland Kitchen aspirin EC 325 MG tablet Take 1 tablet (325 mg total) by mouth daily. 42 tablet 0  . buPROPion (WELLBUTRIN SR) 150 MG 12 hr tablet Take 150 mg by mouth 2 (two) times daily.    . clindamycin (CLEOCIN) 150 MG capsule Take 150 mg by mouth 3 (three) times daily.    . clonazePAM (KLONOPIN) 1 MG tablet Take 1 mg by mouth 2 (two) times daily.     . Insulin Glargine (BASAGLAR KWIKPEN) 100 UNIT/ML SOPN Inject 45 Units into the skin every morning.    Marland Kitchen JANUVIA 100 MG tablet Take 100 mg by mouth every evening.    Marland Kitchen lisinopril (PRINIVIL,ZESTRIL) 20 MG tablet Take 20 mg by mouth daily.    Marland Kitchen omeprazole (PRILOSEC) 20 MG capsule Take 20 mg by mouth 2 (two) times daily.     . simvastatin (ZOCOR) 40 MG tablet Take 40 mg by mouth every evening.    . ondansetron (ZOFRAN ODT) 4 MG disintegrating tablet Take 1 tablet (4 mg total) by mouth every 4 (four) hours as needed for nausea or vomiting. (Patient not taking: Reported on 05/07/2016) 20 tablet 0  . ondansetron (ZOFRAN) 4 MG tablet Take 1 tablet (4 mg total) by mouth every 6 (six) hours as needed for nausea. (Patient not taking: Reported on 05/07/2016) 20 tablet 0  . oxyCODONE (OXY IR/ROXICODONE) 5 MG immediate release tablet Take 1-2 tablets (5-10 mg total) by mouth every 3 (three) hours as needed. (Patient not taking: Reported on 05/07/2016) 50 tablet 0    Home: Home Living Family/patient expects to be discharged to:: Private residence Living Arrangements: Spouse/significant other Available Help at Discharge: Family Type of Home: House Home Access: Stairs to enter Secretary/administrator of Steps: 2-3 Entrance Stairs-Rails: None Home Layout: One level Bathroom Shower/Tub: Engineer, manufacturing systems: Standard Home Equipment: Environmental consultant - 2 wheels  Functional History: Prior Function Level of Independence: Independent Functional Status:    Mobility: Bed Mobility Overal bed mobility: Needs Assistance Bed Mobility: Rolling, Sidelying to Sit, Sit to Sidelying Rolling: Min assist Sidelying to sit: Mod assist Sit to sidelying: Min assist General bed mobility comments: Assist to initiate RUE for rolling to L side. Assist for trunk elevation with sidelying to sit. Light min assist for LEs back to bed. Cues throughout for sequencing and technique. Transfers Overall transfer level: Needs assistance Equipment used: Rolling walker (2 wheeled) Transfers: Sit to/from Stand Sit to Stand: Min assist, +2 physical assistance General transfer comment: Min assist +2 for sit to stand from EOB. Cues for hand placement and technique. Ambulation/Gait Ambulation/Gait assistance: Min assist, +2 physical assistance (moderate assist for faciliatation techniques) Ambulation Distance (Feet): 4 Feet Assistive device: Rolling walker (2 wheeled) Gait Pattern/deviations: Step-through pattern, Decreased stride length, Decreased dorsiflexion - left, Decreased weight shift to right, Ataxic General Gait Details: patient required faciliatation of weight shift, and multi modal cues for step and placement  of LLE. Noted impaired coordination  Gait velocity: decreased Gait velocity interpretation: <1.8 ft/sec, indicative of risk for recurrent falls    ADL: ADL Overall ADL's : Needs assistance/impaired Eating/Feeding: NPO Grooming: Minimal assistance, Sitting Upper Body Bathing: Moderate assistance, Sitting Lower Body Bathing: Maximal assistance, Sit to/from stand Upper Body Dressing : Moderate assistance, Sitting Upper Body Dressing Details (indicate cue type and reason): to don/doff hospital gown Lower Body Dressing: Maximal assistance, Sit to/from stand Toilet Transfer: Minimal assistance, +2 for physical assistance, Stand-pivot Toilet Transfer Details (indicate cue type and reason): Simulated by sit to stand from EOB with few steps. Functional  mobility during ADLs: Minimal assistance, +2 for physical assistance, Rolling walker (for few steps only) General ADL Comments: Discussed need for post acute rehab with pt and family; they are agreeable. Encouraged movement and functional use of LUE. SpO2 down to 90% on RA with activity; reapplied 2L O2/[place].  Cognition: Cognition Overall Cognitive Status: Impaired/Different from baseline Orientation Level: Oriented X4 Cognition Arousal/Alertness: Awake/alert Behavior During Therapy: Flat affect Overall Cognitive Status: Impaired/Different from baseline Area of Impairment: Following commands, Problem solving Following Commands: Follows one step commands consistently, Follows one step commands with increased time Problem Solving: Slow processing, Decreased initiation, Difficulty sequencing, Requires verbal cues, Requires tactile cues General Comments: Pt slow to initiate but able to follow one step commands. Pt perseverating on gross motor coordination testing.   Blood pressure 128/87, pulse 90, temperature 98.9 F (37.2 C), temperature source Oral, resp. rate 18, SpO2 100 %. Physical Exam  Nursing note and vitals reviewed. Constitutional: She is oriented to person, place, and time. She appears well-developed and well-nourished. No distress. Nasal cannula in place.  HENT:  Head: Normocephalic and atraumatic.  Mouth/Throat: Oropharynx is clear and moist.  Eyes: Conjunctivae and EOM are normal. Pupils are equal, round, and reactive to light.  Neck: Normal range of motion. Neck supple.  Cardiovascular: Normal rate and regular rhythm.   Murmur heard. Respiratory: Effort normal and breath sounds normal. No stridor. No respiratory distress.  +[place]  GI: Soft. Bowel sounds are normal. She exhibits no distension. There is no tenderness.  Musculoskeletal: She exhibits no edema or tenderness.  Neurological: She is alert and oriented to person, place, and time.  Left facial weakness with ataxic  and mildly dysarthric speech.  Able to follow simple one and two step commands but had difficulty answering biographic questions.  Intentional tremor RUE.  Motor: RUE/RLE: 4+/5 proximal to distal LUE/LLE: 4-/5 proximal to distal DTRs symmetric Sensation intact to light touch  Skin: Skin is warm and dry.  Psychiatric: Her affect is blunt. She is slowed. Cognition and memory are impaired.    Lab Results Last 24 Hours       Results for orders placed or performed during the hospital encounter of 05/07/16 (from the past 24 hour(s))  Glucose, capillary     Status: Abnormal   Collection Time: 05/07/16  1:23 PM  Result Value Ref Range   Glucose-Capillary 184 (H) 65 - 99 mg/dL   Comment 1 Notify RN    Comment 2 Document in Chart   Glucose, capillary     Status: Abnormal   Collection Time: 05/07/16  4:16 PM  Result Value Ref Range   Glucose-Capillary 199 (H) 65 - 99 mg/dL  Glucose, capillary     Status: Abnormal   Collection Time: 05/07/16  9:27 PM  Result Value Ref Range   Glucose-Capillary 209 (H) 65 - 99 mg/dL  Glucose, capillary  Status: Abnormal   Collection Time: 05/08/16  6:55 AM  Result Value Ref Range   Glucose-Capillary 42 (LL) 65 - 99 mg/dL   Comment 1 Notify RN    Comment 2 Document in Chart   Glucose, capillary     Status: Abnormal   Collection Time: 05/08/16  7:37 AM  Result Value Ref Range   Glucose-Capillary 261 (H) 65 - 99 mg/dL      Imaging Results (Last 48 hours)  Mr Brain Wo Contrast  Result Date: 05/07/2016 CLINICAL DATA:  LEFT-sided weakness began 05/06/2016. Stroke risk factors include diabetes, hypertension, and previous cerebral infarction. EXAM: MRI HEAD WITHOUT CONTRAST MRA HEAD WITHOUT CONTRAST TECHNIQUE: Multiplanar, multiecho pulse sequences of the brain and surrounding structures were obtained without intravenous contrast. Angiographic images of the head were obtained using MRA technique without contrast. COMPARISON:  CT head  05/07/2016.  MR brain 08/05/2013. FINDINGS: MRI HEAD FINDINGS Brain: Ovoid area of marked restricted diffusion, 5 x 12 mm cross-section, affecting the posterior lentiform nucleus and posterior limb internal capsule consistent with acute infarction. T2 shine through surrounds an area of previous centrum semiovale lacunar infarction with surrounding gliosis. No hemorrhage, mass lesion, hydrocephalus, or extra-axial fluid. Premature for age cerebral and cerebellar atrophy. Extensive T2 and FLAIR hyperintensity throughout the white matter consistent with small vessel disease. Vascular: Flow voids are maintained throughout the carotid, basilar, and vertebral arteries. There are no areas of chronic hemorrhage. Skull and upper cervical spine: Unremarkable visualized calvarium, skullbase, and cervical vertebrae. Pituitary, pineal, cerebellar tonsils unremarkable. Mild cervical stenosis due to spondylosis at C5-6. Sinuses/Orbits: Conjugate gaze to the RIGHT. Mild chronic sinus disease. Other: None. MRA HEAD FINDINGS The internal carotid arteries are widely patent. The basilar artery is widely patent with vertebrals codominant. There is no intracranial stenosis or aneurysm. The anterior cerebral arteries and middle cerebral arteries are widely patent. Both posterior cerebral arteries are diseased. There is apparent occlusion of the LEFT PCA with no distal flow related enhancement at its P1/P2 junction. On the RIGHT, the P2 PCA is severely diseased with segmental areas of narrowing or near total occlusion. No definite cerebellar branch flow-limiting stenosis or occlusion is seen. IMPRESSION: Acute RIGHT MCA lenticulostriate territory infarct affecting the lentiform nucleus and posterior limb internal capsule correlates with the CT abnormality. This is nonhemorrhagic. 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 No anterior circulation stenosis or occlusion. 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 Electronically Signed   By: Elsie StainJohn T Curnes M.D.   On: 05/07/2016 12:17   Mr Maxine GlennMra Head/brain AVWo Cm  Result Date: 05/07/2016 CLINICAL DATA:  LEFT-sided weakness began 05/06/2016. Stroke risk factors include diabetes, hypertension, and previous cerebral infarction. EXAM: MRI HEAD WITHOUT CONTRAST MRA HEAD WITHOUT CONTRAST TECHNIQUE: Multiplanar, multiecho pulse sequences of the brain and surrounding structures were obtained without intravenous contrast. Angiographic images of the head were obtained using MRA technique without contrast. COMPARISON:  CT head 05/07/2016.  MR brain 08/05/2013. FINDINGS: MRI HEAD FINDINGS Brain: Ovoid area of marked restricted diffusion, 5 x 12 mm cross-section, affecting the posterior lentiform nucleus and posterior limb internal capsule consistent with acute infarction. T2 shine through surrounds an area of previous centrum semiovale lacunar infarction with surrounding gliosis. No hemorrhage, mass lesion, hydrocephalus, or extra-axial fluid. Premature for age cerebral and cerebellar atrophy. Extensive T2 and FLAIR hyperintensity throughout the white matter consistent with small vessel disease. Vascular: Flow voids are maintained throughout the carotid, basilar, and vertebral arteries. There are no areas of  chronic hemorrhage. Skull and upper cervical spine: Unremarkable visualized calvarium, skullbase, and cervical vertebrae. Pituitary, pineal, cerebellar tonsils unremarkable. Mild cervical stenosis due to spondylosis at C5-6. Sinuses/Orbits: Conjugate gaze to the RIGHT. Mild chronic sinus disease. Other: None. MRA HEAD FINDINGS The internal carotid arteries are widely patent. The basilar artery is widely patent with vertebrals codominant. There is no intracranial stenosis or aneurysm. The anterior cerebral arteries and middle cerebral arteries are widely patent. Both posterior cerebral arteries are diseased. There is apparent occlusion of the LEFT PCA with no distal flow  related enhancement at its P1/P2 junction. On the RIGHT, the P2 PCA is severely diseased with segmental areas of narrowing or near total occlusion. No definite cerebellar branch flow-limiting stenosis or occlusion is seen. IMPRESSION: Acute RIGHT MCA lenticulostriate territory infarct affecting the lentiform nucleus and posterior limb internal capsule correlates with the CT abnormality. This is nonhemorrhagic. 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 No anterior circulation stenosis or occlusion. 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 Electronically Signed   By: Elsie Stain M.D.   On: 05/07/2016 12:17     Assessment/Plan: Diagnosis: RIGHT MCA territory infarct Labs and images independently reviewed.  Records reviewed and summated above. Stroke: Continue secondary stroke prophylaxis and Risk Factor Modification listed below:   Antiplatelet therapy:   Blood Pressure Management:  Continue current medication with prn's with permisive HTN per primary team Statin Agent:   Diabetes management:   Tobacco abuse:   Left sided hemiparesis Motor recovery: Fluoxetine  1. Does the need for close, 24 hr/day medical supervision in concert with the patient's rehab needs make it unreasonable for this patient to be served in a less intensive setting? Yes  2. Co-Morbidities requiring supervision/potential complications: HTN (monitor and provide prns in accordance with increased physical exertion and pain), T2 DM (currently extremely labile) (Monitor in accordance with exercise and adjust meds as necessary), BUE tremor, depression/anxiety (ensure mood does not hinder progress of therapies), prior stroke with residual memory deficits, calcified MV with mild regurgitation (Monitor in accordance with increased physical activity and avoid UE resistance excercises), tobacco abuse (counsel), leukocytosis (cont to monitor for signs and symptoms of infection, further workup if  indicated), dysphagia (advance diet as tolerated) 3. Due to bladder management, bowel management, safety, skin/wound care, disease management, medication administration and patient education, does the patient require 24 hr/day rehab nursing? Yes 4. Does the patient require coordinated care of a physician, rehab nurse, PT (1-2 hrs/day, 5 days/week), OT (1-2 hrs/day, 5 days/week) and SLP (1-2 hrs/day, 5 days/week) to address physical and functional deficits in the context of the above medical diagnosis(es)? Yes Addressing deficits in the following areas: balance, endurance, locomotion, strength, transferring, bowel/bladder control, bathing, dressing, feeding, grooming, toileting, cognition, speech, swallowing and psychosocial support 5. Can the patient actively participate in an intensive therapy program of at least 3 hrs of therapy per day at least 5 days per week? Yes 6. The potential for patient to make measurable gains while on inpatient rehab is good and fair 7. Anticipated functional outcomes upon discharge from inpatient rehab are supervision and min assist  with PT, supervision and min assist with OT, supervision and min assist with SLP. 8. Estimated rehab length of stay to reach the above functional goals is: 10-14 days. 9. Does the patient have adequate social supports and living environment to accommodate these discharge functional goals? Potentially 10. Anticipated D/C setting: TBD 11. Anticipated post D/C treatments: HH therapy and Home excercise  program 12. Overall Rehab/Functional Prognosis: good and fair  RECOMMENDATIONS: This patient's condition is appropriate for continued rehabilitative care in the following setting: Will need to inquire about baseline level of functioning.  If pt sedentary at baseline and without caregiver support during discharge during the day, recommend SNF, if pt cannot return home.   Patient has agreed to participate in recommended program. Potentially Note  that insurance prior authorization may be required for reimbursement for recommended care.  Comment: Rehab Admissions Coordinator to follow up.  Maryla Morrow, MD, [facility] 05/08/2016    Revision History                        Routing History

## 2016-05-10 NOTE — Progress Notes (Addendum)
Physical Therapy Treatment note (late entry)  Patient making good progress (despite continued nausea; denies dizziness). Able to walk a maximum of 8 ft with RW and 2 person mod assist. LLE control improving.   05/09/16 1000  PT Visit Information  Last PT Received On 05/09/16  Assistance Needed +2  History of Present Illness 60 year old lady with past medical history of hypertension, hyperlipidemia, diabetes mellitus, stroke, GERD, depression, who presents with left-sided weakness and left facial numbness. Acute RIGHT MCA lenticulostriate territory infarct.  Subjective Data  Patient Stated Goal to get back to indpendence  Precautions  Precautions Fall  Restrictions  Weight Bearing Restrictions No  Pain Assessment  Pain Assessment No/denies pain  Cognition  Arousal/Alertness Awake/alert  Behavior During Therapy Flat affect  Overall Cognitive Status Impaired/Different from baseline  Area of Impairment Following commands;Problem solving;Attention  Current Attention Level Selective (Inattention to LUE)  Following Commands Follows one step commands consistently;Follows one step commands with increased time  Problem Solving Slow processing;Decreased initiation;Difficulty sequencing;Requires verbal cues;Requires tactile cues  Bed Mobility  Overal bed mobility Needs Assistance  Bed Mobility Supine to Sit  Supine to sit Min assist  General bed mobility comments incr ease to come to sit; continued assist to scoot out to EOB in sitting  Transfers  Overall transfer level Needs assistance  Equipment used Rolling walker (2 wheeled)  Transfers Sit to/from Stand  Sit to Stand Min assist  General transfer comment repeated vc for safe technique with RW; assist to grasp RW with Lt hand and for balance (lt lean  Ambulation/Gait  Ambulation/Gait assistance +2 physical assistance;+2 safety/equipment;Mod assist  Ambulation Distance (Feet) 6 Feet (sit rest; 8)  Assistive device Rolling walker (2  wheeled)  Gait Pattern/deviations Step-through pattern;Decreased step length - right;Decreased dorsiflexion - left;Decreased weight shift to right;Steppage;Ataxic;Narrow base of support  General Gait Details assist to weight shift Rt to unweight LLE and allow her to advance LLE; near scissoring with LLE (very narrow BOS) resulting in mulitple significant LOB requiring mod assist to recover  Gait velocity interpretation <1.8 ft/sec, indicative of risk for recurrent falls  Modified Rankin (Stroke Patients Only)  Pre-Morbid Rankin Score 0  Modified Rankin 4  Balance  Sitting balance-Leahy Scale Fair  Standing balance support Bilateral upper extremity supported  Standing balance-Leahy Scale Poor  General Comments  General comments (skin integrity, edema, etc.) husband present  PT - End of Session  Equipment Utilized During Treatment Gait belt  Activity Tolerance Patient tolerated treatment well (remains nauseated, yet works through it)  Patient left with call bell/phone within reach;with family/visitor present;in Personnel officerchair;with chair alarm set  Nurse Communication Mobility status  PT - Assessment/Plan  PT Plan Current plan remains appropriate  PT Frequency (ACUTE ONLY) Min 4X/week  Follow Up Recommendations CIR  PT equipment Other (comment) (TBD)  PT Goal Progression  Progress towards PT goals Progressing toward goals  Acute Rehab PT Goals  Time For Goal Achievement 05/21/16  PT Time Calculation  PT Start Time (ACUTE ONLY) 0943  PT Stop Time (ACUTE ONLY) 1000  PT Time Calculation (min) (ACUTE ONLY) 17 min  PT General Charges  $$ ACUTE PT VISIT 1 Procedure  PT Treatments  $Gait Training 8-22 mins    05/10/2016 Veda CanningLynn Annastasia Haskins, PT Pager: [phone]

## 2016-05-10 NOTE — Progress Notes (Signed)
Inpatient Rehabilitation  I have bed availability and Dr. Susie CassetteAbrol has given medical clearance to admit pt. to CIR today.  Pt. and husband are pleased and agreeable.  I have  updated Fabio NeighborsKelli Willard, RNCM and pt's RN Shaun of the plan.    Weldon PickingSusan Emelio Schneller PT Inpatient Rehab Admissions Coordinator Cell [phone] Office [phone]

## 2016-05-10 NOTE — Discharge Summary (Addendum)
Physician Discharge Summary  KOLLYNS MICKELSON MRN: [id number] DOB/AGE: 62-Jun-1957 25 y.o.  PCP: Pcp Not In System   Admit date: 05/07/2016 Discharge date: 05/10/2016  Discharge Diagnoses:    Principal Problem:   Acute ischemic stroke [facility]) Active Problems:   Hypotension   Diabetes mellitus ([place])   Hyperlipidemia   History of CVA (cerebrovascular accident)   Depression   Generalized anxiety disorder   History of CVA with residual deficit   Benign essential HTN   Non-rheumatic mitral regurgitation   Tobacco abuse   Leukocytosis   Dysphagia, post-stroke    Follow-up recommendations Follow-up with PCP in 3-5 days , including all  additional recommended appointments as below Follow-up CBC, CMP in 3-5 days          Current Discharge Medication List    START taking these medications   Details  atorvastatin (LIPITOR) 40 MG tablet Take 1 tablet (40 mg total) by mouth daily at 6 PM. Qty: 30 tablet, Refills: 1    clopidogrel (PLAVIX) 75 MG tablet Take 1 tablet (75 mg total) by mouth daily. Qty: 30 tablet, Refills: 1      CONTINUE these medications which have CHANGED   Details  clonazePAM (KLONOPIN) 0.5 MG tablet Take 1 tablet (0.5 mg total) by mouth 2 (two) times daily. Qty: 10 tablet, Refills: 0    Insulin Glargine (BASAGLAR KWIKPEN) 100 UNIT/ML SOPN Inject 0.25 mLs (25 Units total) into the skin every morning. Qty: 1 pen, Refills: 12      CONTINUE these medications which have NOT CHANGED   Details  ARIPiprazole (ABILIFY) 30 MG tablet Take 30 mg by mouth 2 (two) times daily.    aspirin EC 325 MG tablet Take 1 tablet (325 mg total) by mouth daily. Qty: 42 tablet, Refills: 0    buPROPion (WELLBUTRIN SR) 150 MG 12 hr tablet Take 150 mg by mouth 2 (two) times daily.    JANUVIA 100 MG tablet Take 100 mg by mouth every evening.    lisinopril (PRINIVIL,ZESTRIL) 20 MG tablet Take 20 mg by mouth daily.    omeprazole (PRILOSEC) 20 MG capsule Take 20 mg by mouth 2 (two)  times daily.     ondansetron (ZOFRAN) 4 MG tablet Take 1 tablet (4 mg total) by mouth every 6 (six) hours as needed for nausea. Qty: 20 tablet, Refills: 0    oxyCODONE (OXY IR/ROXICODONE) 5 MG immediate release tablet Take 1-2 tablets (5-10 mg total) by mouth every 3 (three) hours as needed. Qty: 50 tablet, Refills: 0      STOP taking these medications     clindamycin (CLEOCIN) 150 MG capsule      simvastatin (ZOCOR) 40 MG tablet      ondansetron (ZOFRAN ODT) 4 MG disintegrating tablet          Discharge Condition: Stable Discharge Instructions Get Medicines reviewed and adjusted: Please take all your medications with you for your next visit with your Primary MD  Please request your Primary MD to go over all hospital tests and procedure/radiological results at the follow up, please ask your Primary MD to get all Hospital records sent to his/her office.  If you experience worsening of your admission symptoms, develop shortness of breath, life threatening emergency, suicidal or homicidal thoughts you must seek medical attention immediately by calling 911 or calling your MD immediately if symptoms less severe.  You must read complete instructions/literature along with all the possible adverse reactions/side effects for all the Medicines you take and that  have been prescribed to you. Take any new Medicines after you have completely understood and accpet all the possible adverse reactions/side effects.   Do not drive when taking Pain medications.   Do not take more than prescribed Pain, Sleep and Anxiety Medications  Special Instructions: If you have smoked or chewed Tobacco in the last 2 yrs please stop smoking, stop any regular Alcohol and or any Recreational drug use.  Wear Seat belts while driving.  Please note  You were cared for by a hospitalist during your hospital stay. Once you are discharged, your primary care physician will handle any further medical issues. Please  note that NO REFILLS for any discharge medications will be authorized once you are discharged, as it is imperative that you return to your primary care physician (or establish a relationship with a primary care physician if you do not have one) for your aftercare needs so that they can reassess your need for medications and monitor your lab values.  Discharge Instructions    Ambulatory referral to Neurology    Complete by:  As directed    Follow up with NP Cecille Rubin at [facility] in about 2 months. Thanks.   Diet - low sodium heart healthy    Complete by:  As directed    Increase activity slowly    Complete by:  As directed        Allergies  Allergen Reactions  . Codeine Nausea And Vomiting and Rash  . Penicillins Rash      Disposition: Inpatient rehabilitation   Consults:  Neurology Inpatient rehabilitation     Significant Diagnostic Studies:  Mr Brain 64 Contrast  Result Date: 05/07/2016 CLINICAL DATA:  LEFT-sided weakness began 05/06/2016. Stroke risk factors include diabetes, hypertension, and previous cerebral infarction. EXAM: MRI HEAD WITHOUT CONTRAST MRA HEAD WITHOUT CONTRAST TECHNIQUE: Multiplanar, multiecho pulse sequences of the brain and surrounding structures were obtained without intravenous contrast. Angiographic images of the head were obtained using MRA technique without contrast. COMPARISON:  CT head 05/07/2016.  MR brain 08/05/2013. FINDINGS: MRI HEAD FINDINGS Brain: Ovoid area of marked restricted diffusion, 5 x 12 mm cross-section, affecting the posterior lentiform nucleus and posterior limb internal capsule consistent with acute infarction. T2 shine through surrounds an area of previous centrum semiovale lacunar infarction with surrounding gliosis. No hemorrhage, mass lesion, hydrocephalus, or extra-axial fluid. Premature for age cerebral and cerebellar atrophy. Extensive T2 and FLAIR hyperintensity throughout the white matter consistent with small vessel  disease. Vascular: Flow voids are maintained throughout the carotid, basilar, and vertebral arteries. There are no areas of chronic hemorrhage. Skull and upper cervical spine: Unremarkable visualized calvarium, skullbase, and cervical vertebrae. Pituitary, pineal, cerebellar tonsils unremarkable. Mild cervical stenosis due to spondylosis at C5-6. Sinuses/Orbits: Conjugate gaze to the RIGHT. Mild chronic sinus disease. Other: None. MRA HEAD FINDINGS The internal carotid arteries are widely patent. The basilar artery is widely patent with vertebrals codominant. There is no intracranial stenosis or aneurysm. The anterior cerebral arteries and middle cerebral arteries are widely patent. Both posterior cerebral arteries are diseased. There is apparent occlusion of the LEFT PCA with no distal flow related enhancement at its P1/P2 junction. On the RIGHT, the P2 PCA is severely diseased with segmental areas of narrowing or near total occlusion. No definite cerebellar branch flow-limiting stenosis or occlusion is seen. IMPRESSION: Acute RIGHT MCA lenticulostriate territory infarct affecting the lentiform nucleus and posterior limb internal capsule correlates with the CT abnormality. This is nonhemorrhagic. Atrophy and small vessel disease of  an advanced nature. No anterior circulation stenosis or occlusion. 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 Electronically Signed   By: Staci Righter M.D.   On: 05/07/2016 12:17   Mr Jodene Nam Head/brain IB Cm  Result Date: 05/07/2016 CLINICAL DATA:  LEFT-sided weakness began 05/06/2016. Stroke risk factors include diabetes, hypertension, and previous cerebral infarction. EXAM: MRI HEAD WITHOUT CONTRAST MRA HEAD WITHOUT CONTRAST TECHNIQUE: Multiplanar, multiecho pulse sequences of the brain and surrounding structures were obtained without intravenous contrast. Angiographic images of the head were obtained using MRA technique without contrast. COMPARISON:  CT head  05/07/2016.  MR brain 08/05/2013. FINDINGS: MRI HEAD FINDINGS Brain: Ovoid area of marked restricted diffusion, 5 x 12 mm cross-section, affecting the posterior lentiform nucleus and posterior limb internal capsule consistent with acute infarction. T2 shine through surrounds an area of previous centrum semiovale lacunar infarction with surrounding gliosis. No hemorrhage, mass lesion, hydrocephalus, or extra-axial fluid. Premature for age cerebral and cerebellar atrophy. Extensive T2 and FLAIR hyperintensity throughout the white matter consistent with small vessel disease. Vascular: Flow voids are maintained throughout the carotid, basilar, and vertebral arteries. There are no areas of chronic hemorrhage. Skull and upper cervical spine: Unremarkable visualized calvarium, skullbase, and cervical vertebrae. Pituitary, pineal, cerebellar tonsils unremarkable. Mild cervical stenosis due to spondylosis at C5-6. Sinuses/Orbits: Conjugate gaze to the RIGHT. Mild chronic sinus disease. Other: None. MRA HEAD FINDINGS The internal carotid arteries are widely patent. The basilar artery is widely patent with vertebrals codominant. There is no intracranial stenosis or aneurysm. The anterior cerebral arteries and middle cerebral arteries are widely patent. Both posterior cerebral arteries are diseased. There is apparent occlusion of the LEFT PCA with no distal flow related enhancement at its P1/P2 junction. On the RIGHT, the P2 PCA is severely diseased with segmental areas of narrowing or near total occlusion. No definite cerebellar branch flow-limiting stenosis or occlusion is seen. IMPRESSION: Acute RIGHT MCA lenticulostriate territory infarct affecting the lentiform nucleus and posterior limb internal capsule correlates with the CT abnormality. This is nonhemorrhagic. 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 No anterior circulation stenosis or occlusion. 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 Electronically Signed   By: Staci Righter M.D.   On: 05/07/2016 12:17  2-D echo LV EF: 65% -   70%  ------------------------------------------------------------------- Indications:      CVA 436.  ------------------------------------------------------------------- History:   Risk factors:  Diabetes mellitus. Dyslipidemia.  ------------------------------------------------------------------- Study Conclusions  - Left ventricle: The cavity size was normal. There was moderate   concentric hypertrophy. Systolic function was vigorous. The   estimated ejection fraction was in the range of 65% to 70%. Wall   motion was normal; there were no regional wall motion   abnormalities. Doppler parameters are consistent with abnormal   left ventricular relaxation (grade 1 diastolic dysfunction). The   E/e&' ratio is >15, suggesting elevated LV filling pressure. - Aortic valve: Sclerosis without stenosis. There was no   regurgitation. - Mitral valve: Heavily calcified mitral annulus, especially   posteriorly. Mild regurgitation. Valve area by continuity   equation (using LVOT flow): 2.14 cm^2. - Left atrium: The atrium was normal in size. - Inferior vena cava: The vessel was normal in size. The   respirophasic diameter changes were in the normal range (>= 50%),   consistent with normal central venous pressure.  Impressions:  - Compared to a prior study in 2009, there is more heavy posterior   mitral annular calcification - unclear  if this could have been a   source of embolic stroke. LVEF is unchanged at 65-70%.    There were no vitals filed for this visit.   Microbiology: No results found for this or any previous visit (from the past 240 hour(s)).     Blood Culture    Component Value Date/Time   SDES BLOOD RIGHT HAND 04/14/2012 2035   SPECREQUEST BOTTLES DRAWN AEROBIC ONLY 3CC 04/14/2012 2035   CULT NO GROWTH 5 DAYS 04/14/2012 2035   REPTSTATUS 04/21/2012 FINAL  04/14/2012 2035      Labs: Results for orders placed or performed during the hospital encounter of 05/07/16 (from the past 48 hour(s))  Glucose, capillary     Status: Abnormal   Collection Time: 05/08/16 11:35 AM  Result Value Ref Range   Glucose-Capillary 333 (H) 65 - 99 mg/dL   Comment 1 Notify RN    Comment 2 Document in Chart   Urine rapid drug screen (hosp performed)     Status: None   Collection Time: 05/08/16 12:48 PM  Result Value Ref Range   Opiates NONE DETECTED NONE DETECTED   Cocaine NONE DETECTED NONE DETECTED   Benzodiazepines NONE DETECTED NONE DETECTED   Amphetamines NONE DETECTED NONE DETECTED   Tetrahydrocannabinol NONE DETECTED NONE DETECTED   Barbiturates NONE DETECTED NONE DETECTED    Comment:        DRUG SCREEN FOR MEDICAL PURPOSES ONLY.  IF CONFIRMATION IS NEEDED FOR ANY PURPOSE, NOTIFY LAB WITHIN 5 DAYS.        LOWEST DETECTABLE LIMITS FOR URINE DRUG SCREEN Drug Class       Cutoff (ng/mL) Amphetamine      1000 Barbiturate      200 Benzodiazepine   643 Tricyclics       329 Opiates          300 Cocaine          300 THC              50   Urinalysis, Routine w reflex microscopic (not at [facility])     Status: Abnormal   Collection Time: 05/08/16 12:48 PM  Result Value Ref Range   Color, Urine YELLOW YELLOW   APPearance CLOUDY (A) CLEAR   Specific Gravity, Urine 1.038 (H) 1.005 - 1.030   pH 5.0 5.0 - 8.0   Glucose, UA >1000 (A) NEGATIVE mg/dL   Hgb urine dipstick TRACE (A) NEGATIVE   Bilirubin Urine NEGATIVE NEGATIVE   Ketones, ur 15 (A) NEGATIVE mg/dL   Protein, ur 100 (A) NEGATIVE mg/dL   Nitrite NEGATIVE NEGATIVE   Leukocytes, UA TRACE (A) NEGATIVE  Urine microscopic-add on     Status: Abnormal   Collection Time: 05/08/16 12:48 PM  Result Value Ref Range   Squamous Epithelial / LPF 0-5 (A) NONE SEEN   WBC, UA 6-30 0 - 5 WBC/hpf   RBC / HPF NONE SEEN 0 - 5 RBC/hpf   Bacteria, UA RARE (A) NONE SEEN   Urine-Other YEAST PRESENT   Troponin I      Status: None   Collection Time: 05/08/16  3:14 PM  Result Value Ref Range   Troponin I <0.03 <0.03 ng/mL  Glucose, capillary     Status: Abnormal   Collection Time: 05/08/16  4:56 PM  Result Value Ref Range   Glucose-Capillary 104 (H) 65 - 99 mg/dL   Comment 1 Notify RN    Comment 2 Document in Chart   Glucose, capillary  Status: Abnormal   Collection Time: 05/08/16  9:28 PM  Result Value Ref Range   Glucose-Capillary 218 (H) 65 - 99 mg/dL   Comment 1 Notify RN    Comment 2 Document in Chart   CBC     Status: Abnormal   Collection Time: 05/09/16  4:08 AM  Result Value Ref Range   WBC 10.9 (H) 4.0 - 10.5 K/uL   RBC 4.51 3.87 - 5.11 MIL/uL   Hemoglobin 13.3 12.0 - 15.0 g/dL   HCT 38.7 36.0 - 46.0 %   MCV 85.8 78.0 - 100.0 fL   MCH 29.5 26.0 - 34.0 pg   MCHC 34.4 30.0 - 36.0 g/dL   RDW 12.4 11.5 - 15.5 %   Platelets 240 150 - 400 K/uL  Basic metabolic panel     Status: Abnormal   Collection Time: 05/09/16  4:08 AM  Result Value Ref Range   Sodium 138 135 - 145 mmol/L   Potassium 3.7 3.5 - 5.1 mmol/L   Chloride 101 101 - 111 mmol/L   CO2 26 22 - 32 mmol/L   Glucose, Bld 179 (H) 65 - 99 mg/dL   BUN 14 6 - 20 mg/dL   Creatinine, Ser 0.96 0.44 - 1.00 mg/dL   Calcium 9.5 8.9 - 10.3 mg/dL   GFR calc non Af Amer >60 >60 mL/min   GFR calc Af Amer >60 >60 mL/min    Comment: (NOTE) The eGFR has been calculated using the CKD EPI equation. This calculation has not been validated in all clinical situations. eGFR's persistently <60 mL/min signify possible Chronic Kidney Disease.    Anion gap 11 5 - 15  Glucose, capillary     Status: Abnormal   Collection Time: 05/09/16  6:17 AM  Result Value Ref Range   Glucose-Capillary 215 (H) 65 - 99 mg/dL   Comment 1 Notify RN    Comment 2 Document in Chart   Glucose, capillary     Status: Abnormal   Collection Time: 05/09/16 11:50 AM  Result Value Ref Range   Glucose-Capillary 249 (H) 65 - 99 mg/dL   Comment 1 Notify RN    Comment 2  Document in Chart   Glucose, capillary     Status: Abnormal   Collection Time: 05/09/16  4:59 PM  Result Value Ref Range   Glucose-Capillary 234 (H) 65 - 99 mg/dL   Comment 1 Notify RN    Comment 2 Document in Chart   Glucose, capillary     Status: None   Collection Time: 05/09/16  9:41 PM  Result Value Ref Range   Glucose-Capillary 84 65 - 99 mg/dL   Comment 1 Notify RN    Comment 2 Document in Chart   Glucose, capillary     Status: None   Collection Time: 05/09/16 10:16 PM  Result Value Ref Range   Glucose-Capillary 84 65 - 99 mg/dL  Glucose, capillary     Status: Abnormal   Collection Time: 05/10/16  6:36 AM  Result Value Ref Range   Glucose-Capillary 146 (H) 65 - 99 mg/dL   Comment 1 Notify RN    Comment 2 Document in Chart      Lipid Panel     Component Value Date/Time   CHOL 181 05/07/2016 0809   TRIG 74 05/07/2016 0809   HDL 59 05/07/2016 0809   CHOLHDL 3.1 05/07/2016 0809   VLDL 15 05/07/2016 0809   LDLCALC 107 (H) 05/07/2016 0809     Lab Results  Component Value Date   HGBA1C 8.2 (H) 05/07/2016   HGBA1C 8.3 (H) 04/14/2012   HGBA1C (H) 05/25/2008    9.8 (NOTE)   The ADA recommends the following therapeutic goal for glycemic   control related to Hgb A1C measurement:   Goal of Therapy:   < 7.0% Hgb A1C   Reference: American Diabetes Association: Clinical Practice   Recommendations 2008, Diabetes Care,  2008, 31:(Suppl 1).        HPI :   13-yo RH woman who was sent to [facility] for evaluation of possible stroke. History is obtained from the patient as well as from review of available medical records.   She presented to the ED at [facility] earlier today for the evaluation of slurred speech and left-sided weakness. This started around 1800 on 05/06/16 (LKW) but she initially refused to come to the ED as requested by her husband. They reported that she had a prior stroke but this only affected her memory according to them.  NIHSS score was 5 (2 for facial paralysis, 1 for left arm drift, 1 for best language, and 1 for dysarthria). CTH without contrast was obtained and interpreted as showing a 14 mm hypodensity in the posterior limb of the R internal capsule, suspicious for an acute or early subacute ischemic infarction. She was transferred to [facility] at the family's request. She was taking aspirin 81 mg daily and simvastatin 40 mg daily prior to this admission. NHISS score: 5 in OSH ED. mRS score: 0. Patient was not administered IV t-PA secondary to outside of window at upon arrival to [facility]. She was admitted for further evaluation and treatment  HOSPITAL COURSE:   Stroke:  right lenticulostriate infarct likely secondary to small vessel disease source due to stroke risk factor including HTN, uncontrolled DM, HLD, heavy smoking.  Resultant  L hemiparesis, dysarthria, left facial droop  MRI  Acute R lenticulostriate infarct. 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    LDL 107  HgbA1c 8.2  Lovenox 40 mg sq daily for VTE prophylaxis  DIET DYS 3 Room service appropriate? Yes; Fluid consistency: Thin  aspirin 325 mg daily prior to admission, now on aspirin 325 mg daily and plavix for DAPT management. Recommend to continue for 3 months and then change to plavix alone.   Patient counseled to be compliant with her antithrombotic medications  Ongoing aggressive stroke risk factor management  Therapy recommendations:  CIR. Admissions coordinator following     Hx of stroke  2009 MRI left thalamic infarcts  MRA - b/l PCA stenosis  On ASA and zocor at home   Hypertension There were no vitals taken for this visit. Allow permisive HTN due to above,   Resume lisinopril,    Type II Diabetes  Recent Labs       Lab Results  Component Value Date   HGBA1C 8.3 (H) 04/14/2012     Continue   Lantus, Add  sliding scale if indicated   Anxiety Continue home Xanax  Depression Continue home meds   GERD,no acute symptoms: Continue PPI  Tobacco abuse - Nicotine patch was offered but patient declined  - Counseled cessation     Discharge Exam:   Blood pressure (!) 152/71, pulse 87, temperature 98.1 F (36.7 C), temperature source Oral, resp. rate 20, SpO2 97 %.  Motor Strength - The patient's strength was  normal in all extremities except LUE 4/5 and pronator drift was present on the left.  Bulk was normal and fasciculations were absent.    Follow-up Information    Dennie Bible, NP. Schedule an appointment as soon as possible for a visit in 6 week(s).   Specialty:  Family Medicine Contact information: [address] [place] [address] [phone]           Signed: Reyne Dumas 05/10/2016, 10:22 AM        Time spent >45 mins

## 2016-05-11 ENCOUNTER — Inpatient Hospital Stay (HOSPITAL_COMMUNITY): Payer: Medicare Other | Admitting: Speech Pathology

## 2016-05-11 ENCOUNTER — Inpatient Hospital Stay (HOSPITAL_COMMUNITY): Payer: Medicare Other

## 2016-05-11 ENCOUNTER — Inpatient Hospital Stay (HOSPITAL_COMMUNITY): Payer: Medicare Other | Admitting: Occupational Therapy

## 2016-05-11 DIAGNOSIS — I63511 Cerebral infarction due to unspecified occlusion or stenosis of right middle cerebral artery: Secondary | ICD-10-CM

## 2016-05-11 DIAGNOSIS — N289 Disorder of kidney and ureter, unspecified: Secondary | ICD-10-CM

## 2016-05-11 DIAGNOSIS — E1165 Type 2 diabetes mellitus with hyperglycemia: Secondary | ICD-10-CM

## 2016-05-11 LAB — GLUCOSE, CAPILLARY
Glucose-Capillary: 290 mg/dL — ABNORMAL HIGH (ref 65–99)
Glucose-Capillary: 359 mg/dL — ABNORMAL HIGH (ref 65–99)
Glucose-Capillary: 133 mg/dL — ABNORMAL HIGH (ref 65–99)
Glucose-Capillary: 314 mg/dL — ABNORMAL HIGH (ref 65–99)

## 2016-05-11 LAB — URINE CULTURE

## 2016-05-11 LAB — CBC WITH DIFFERENTIAL/PLATELET
Basophils Absolute: 0 10*3/uL (ref 0.0–0.1)
Basophils Relative: 0 %
Eosinophils Absolute: 0.1 10*3/uL (ref 0.0–0.7)
Eosinophils Relative: 0 %
HCT: 38.1 % (ref 36.0–46.0)
Hemoglobin: 12.8 g/dL (ref 12.0–15.0)
Lymphocytes Relative: 16 %
Lymphs Abs: 2.3 10*3/uL (ref 0.7–4.0)
MCH: 29.2 pg (ref 26.0–34.0)
MCHC: 33.6 g/dL (ref 30.0–36.0)
MCV: 86.8 fL (ref 78.0–100.0)
Monocytes Absolute: 0.8 10*3/uL (ref 0.1–1.0)
Monocytes Relative: 5 %
Neutro Abs: 11.2 10*3/uL — ABNORMAL HIGH (ref 1.7–7.7)
Neutrophils Relative %: 79 %
Platelets: 250 10*3/uL (ref 150–400)
RBC: 4.39 MIL/uL (ref 3.87–5.11)
RDW: 12.5 % (ref 11.5–15.5)
WBC: 14.3 10*3/uL — ABNORMAL HIGH (ref 4.0–10.5)

## 2016-05-11 LAB — COMPREHENSIVE METABOLIC PANEL
ALT: 14 U/L (ref 14–54)
AST: 13 U/L — ABNORMAL LOW (ref 15–41)
Albumin: 3.2 g/dL — ABNORMAL LOW (ref 3.5–5.0)
Alkaline Phosphatase: 66 U/L (ref 38–126)
Anion gap: 13 (ref 5–15)
BUN: 22 mg/dL — ABNORMAL HIGH (ref 6–20)
CO2: 22 mmol/L (ref 22–32)
Calcium: 9.1 mg/dL (ref 8.9–10.3)
Chloride: 101 mmol/L (ref 101–111)
Creatinine, Ser: 1.13 mg/dL — ABNORMAL HIGH (ref 0.44–1.00)
GFR calc Af Amer: 59 mL/min — ABNORMAL LOW (ref >60)
GFR calc non Af Amer: 51 mL/min — ABNORMAL LOW (ref >60)
Glucose, Bld: 297 mg/dL — ABNORMAL HIGH (ref 65–99)
Potassium: 4.5 mmol/L (ref 3.5–5.1)
Sodium: 136 mmol/L (ref 135–145)
Total Bilirubin: 1.1 mg/dL (ref 0.3–1.2)
Total Protein: 6.4 g/dL — ABNORMAL LOW (ref 6.5–8.1)

## 2016-05-11 NOTE — Progress Notes (Signed)
[facility] PHYSICAL MEDICINE & REHABILITATION     PROGRESS NOTE    Subjective/Complaints: Had a fair night. Frustrated that leg is weak.   ROS: pt denies nausea, vomiting, diarrhea, cough, shortness of breath or chest pain   Objective: Vital Signs: Blood pressure 134/65, pulse 94, temperature 98.1 F (36.7 C), temperature source Oral, resp. rate 18, SpO2 96 %. No results found.  Recent Labs  05/09/16 0408 05/11/16 0521  WBC 10.9* 14.3*  HGB 13.3 12.8  HCT 38.7 38.1  PLT 240 250    Recent Labs  05/09/16 0408 05/11/16 0521  NA 138 136  K 3.7 4.5  CL 101 101  GLUCOSE 179* 297*  BUN 14 22*  CREATININE 0.96 1.13*  CALCIUM 9.5 9.1   CBG (last 3)   Recent Labs  05/10/16 1620 05/10/16 2133 05/11/16 0649  GLUCAP 180* 139* 290*    Wt Readings from Last 3 Encounters:  05/23/14 72.6 kg (160 lb)  05/09/12 82.1 kg (181 lb)  04/15/12 87.7 kg (193 lb 4.8 oz)    Physical Exam:  Constitutional: flat. No distress HENT:  Head: Normocephalic and atraumatic.  Mouth/Throat: Oropharynx is pink/moist.  Eyes: PERRL.  Neck: Normal range of motion. Neck supple.  Cardiovascular: RRR without murmur.   No murmur heard. Respiratory: CTA GI: Soft. Bowel sounds are normal. She exhibits no distension. There is no tenderness.  Musculoskeletal: She exhibits no edema or tenderness.  Neurological: She is alert and oriented to person, place, and time.  Left facial weakness/central 7. LUQ anopsia Able to state DOB, city, state and month, [facility]  Able to follow simple one and two step commands.  Intentional tremor RUE minimal today Left inattention..  Motor: RUE/RLE: 4+/5 proximal to distal LUE/LLE: 3 to 4-/5 proximal to distal with inconsistent activation DTRs symmetric Sensation intact to light touch   Skin: Skin is warm and dry.  Psychiatric: Her affect is blunt. Her speech is delayed. She is slowed. She exhibits abnormal recent memory and abnormal remote memory.    Assessment/Plan: 1. Functional, cognitive, and mobility deficits secondary to right MCA infarct which require 3+ hours per day of interdisciplinary therapy in a comprehensive inpatient rehab setting. Physiatrist is providing close team supervision and 24 hour management of active medical problems listed below. Physiatrist and rehab team continue to assess barriers to discharge/monitor patient progress toward functional and medical goals.  Function:  Bathing Bathing position      Bathing parts      Bathing assist        Upper Body Dressing/Undressing Upper body dressing                    Upper body assist        Lower Body Dressing/Undressing Lower body dressing                                  Lower body assist        Toileting Toileting          Toileting assist     Transfers Chair/bed transfer             Locomotion Ambulation           Wheelchair          Cognition Comprehension Comprehension assist level: Understands basic 90% of the time/cues < 10% of the time  Expression Expression assist level: Expresses basic needs/ideas: With no assist  Social Interaction Social Interaction assist level: Interacts appropriately 90% of the time - Needs monitoring or encouragement for participation or interaction.  Problem Solving Problem solving assist level: Solves complex 90% of the time/cues < 10% of the time  Memory Memory assist level: Recognizes or recalls 90% of the time/requires cueing < 10% of the time   Medical Problem List and Plan: 1.  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 2.  DVT Prophylaxis/Anticoagulation: Pharmaceutical: Lovenox 3. Pain Management: N/A  4. Mood: LCSW to follow for evaluation and support.  5. Neuropsych: This patient is not fully capable of making decisions on her own behalf. 6. Skin/Wound Care: routine pressure relief measures.  7.  Fluids/Electrolytes/Nutrition: Monitor I/O. I personally reviewed the patient's labs today.   -BUN trending up--push po  -recheck in AM  8. HTN:  Lisinopril on hold with control at present  9. T2DM: Monitor BS ac/hs. On Lantus 18 units daily with SSI for elevated BS- . Resumed Januvia (tradjenta).   -titrate lantus depending upon response to [place] 10 Anxiety/depression: team to provide ego support. She was on Abilify, Klonopin and Wellbutrin at home. Klonopin resumed  . resumed low dose Wellbutrin..   11. Dyslipidemia: On Lipitor.    12. Nausea:  Will resume Prilosec. Anxiety may be a component.    -ua equivocal--await culture  -wbc's trending up to 14k---no signs of infection on exam  LOS (Days) 1 A FACE TO FACE EVALUATION WAS PERFORMED  SWARTZ,ZACHARY T 05/11/2016 8:28 AM

## 2016-05-11 NOTE — Evaluation (Signed)
Occupational Therapy Assessment and Plan  Patient Details  Name: Chloe Owens MRN: [id number] Date of Birth: [birth year]  OT Diagnosis: abnormal posture, cognitive deficits, hemiplegia affecting non-dominant side and coordination disorder, weakness Rehab Potential:  Good ELOS: 14-17 days   Today's Date: 05/11/2016 OT Individual Time: 0901-1000 OT Individual Time Calculation (min): 59 min      Problem List: Patient Active Problem List   Diagnosis Date Noted  . Acute ischemic right MCA stroke ([place]) 05/10/2016  . Cognitive deficit, post-stroke   . Adjustment disorder with mixed anxiety and depressed mood   . Nausea without vomiting   . Hemiparesis of right nondominant side due to cerebral infarction ([place])   . Dysarthria, post-stroke   . Hemiparesis of left nondominant side due to cerebral infarction ([place])   . History of CVA with residual deficit   . Benign essential HTN   . Non-rheumatic mitral regurgitation   . Tobacco abuse   . Leukocytosis   . Dysphagia, post-stroke   . Acute ischemic stroke ([place]) 05/07/2016  . Depression 05/07/2016  . Generalized anxiety disorder 05/07/2016  . Ankle fracture, left 04/14/2012  . Fall due to stumbling 04/14/2012  . Hypoglycemia 04/14/2012  . Hypotension 04/14/2012  . Diabetes mellitus ([place]) 04/14/2012  . Hyperlipidemia 04/14/2012  . History of CVA (cerebrovascular accident) 04/14/2012    Past Medical History:  Past Medical History:  Diagnosis Date  . Anxiety   . Bladder infection    hx of  . Depression   . Diabetes mellitus without complication ([place])   . GERD (gastroesophageal reflux disease)   . Hyperlipidemia   . Hypertension   . Neuromuscular disorder ([place])   . Stroke [facility])    05/2008   Past Surgical History:  Past Surgical History:  Procedure Laterality Date  . ANKLE CLOSED REDUCTION  04/15/2012   Procedure: CLOSED REDUCTION ANKLE;  Surgeon: Marin Shutter, MD;  Location: [place];  Service: Orthopedics;  Laterality: Left;   . CARPAL TUNNEL RELEASE     bilaterally  . ORIF ANKLE FRACTURE  05/09/2012   Procedure: OPEN REDUCTION INTERNAL FIXATION (ORIF) ANKLE FRACTURE;  Surgeon: Wylene Simmer, MD;  Location: [place];  Service: Orthopedics;  Laterality: Left;  ORIF VERSES EXFIX LEFT ANKLE FRACTURE    Assessment & Plan Clinical Impression: Patient is a 60 y.o. year old female withhistory of T2DM, HTN, BUE tremors, CVA 2009 with memory deficits  who was admitted on 05/07/16 with slurred speech, difficulty walking and left sided weakness since evening prior to admission. MRI/MRA brain done revealing acute RIGHT MCA lenticulostriate territory infarct affecting the lentiform nucleus and posterior limb internal capsule, advanced atrophy and SVD, severe bilateral PCA disease. 2D echo with EF 65-70%, moderate LVH, aortic sclerosis without stenosis and heavily calcified MV with mild regurgitation could be source of embolic stroke. Carotid dopplers without significant ICA stenosis. 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 PT/OT evaluations done revealing LUE weakness with poor coordination, balance deficits, question of visual deficits and patient requires cues for sequencing with tasks. CIR recommended for follow up therapy .  Patient transferred to CIR on 05/10/2016 .    Patient currently requires max - total A with basic self-care skills secondary to muscle weakness, decreased cardiorespiratoy endurance, abnormal tone, decreased coordination and decreased motor planning, decreased awareness, decreased problem solving, decreased safety awareness, decreased memory and delayed processing and decreased sitting balance, decreased standing balance, decreased postural control, hemiplegia and decreased  balance strategies.  Prior to hospitalization, patient could complete ADLs with modified independent .  Patient will benefit from skilled intervention to increase independence with basic  self-care skills prior to discharge home with care partner.  Anticipate patient will require 24 hour supervision and follow up outpatient.  OT - End of Session Activity Tolerance: Decreased this session Endurance Deficit: Yes Endurance Deficit Description: multiple rest breaks needed with self care tasks OT Assessment Barriers to Discharge: Inaccessible home environment OT Patient demonstrates impairments in the following area(s): Balance;Cognition;Endurance;Motor;Pain;Safety OT Basic ADL's Functional Problem(s): Grooming;Bathing;Dressing;Toileting OT Transfers Functional Problem(s): Toilet;Tub/Shower OT Additional Impairment(s): Fuctional Use of Upper Extremity OT Plan OT Intensity: Minimum of 1-2 x/day, 45 to 90 minutes OT Frequency: 5 out of 7 days OT Duration/Estimated Length of Stay: 14-17 days OT Treatment/Interventions: Balance/vestibular training;Neuromuscular re-education;Self Care/advanced ADL retraining;Therapeutic Exercise;Wheelchair propulsion/positioning;DME/adaptive equipment instruction;Pain management;Skin care/wound managment;UE/LE Strength taining/ROM;Cognitive remediation/compensation;Community reintegration;Patient/family education;UE/LE Coordination activities;Discharge planning;Functional mobility training;Psychosocial support;Therapeutic Activities OT Self Feeding Anticipated Outcome(s): n/a OT Basic Self-Care Anticipated Outcome(s): supervision OT Toileting Anticipated Outcome(s): supervision OT Bathroom Transfers Anticipated Outcome(s): supervision OT Recommendation Patient destination: Home Follow Up Recommendations: 24 hour supervision/assistance;Outpatient OT Equipment Recommended: To be determined   Skilled Therapeutic Intervention  Upon entering the room, pt seated in recliner chair with husband present during evaluation. OT educated pt and husband on OT purpose, POC, goals, and rehab expectations. Pt and husband verbalized understanding. Pt transferred  from recliner chair to wheelchair with max A stand pivot transfer for lifting and lowering assistance. Pt transferred onto TTB from wheelchair with max A but reporting need to toilet. Pt incontinent of bowel in brief and seated on toilet for max A. Sitting balance on toilet with supervision - min A for balance. Bathing and dressing performed from toilet as pt has upset stomach at this time. Pt needing continued focus on hemiplegic dressing techniques. Pt standing with max A standing balance while therapist performed toilet hygiene and clothing management. Pt seated in wheelchair with quick release belt donned and call bell within reach upon exiting the room.    OT Evaluation Precautions/Restrictions  Precautions Precautions: Fall Restrictions Weight Bearing Restrictions: No   Pain Pain Assessment Pain Assessment: No/denies pain Pain Score: 7  Pain Type: Acute pain Pain Location: Coccyx Pain Descriptors / Indicators: Aching;Discomfort Pain Intervention(s): Repositioned;Medication (See eMAR) Home Living/Prior Functioning Home Living Available Help at Discharge: Family, Available 24 hours/day Type of Home: House Home Access: Stairs to enter Technical brewer of Steps: 2 Entrance Stairs-Rails: None Home Layout: One level Bathroom Shower/Tub: Multimedia programmer: Standard  Lives With: Spouse Prior Function Level of Independence: Independent with basic ADLs, Independent with gait, Independent with homemaking with ambulation, Independent with transfers Vocation: On disability Vision/Perception  Vision- History Baseline Vision/History: Wears glasses Wears Glasses: Reading only Patient Visual Report: No change from baseline  Cognition Overall Cognitive Status: Impaired/Different from baseline Arousal/Alertness: Awake/alert Orientation Level: Person;Place;Situation Person: Oriented Place: Oriented Situation: Oriented Year: 2017 Month: December Day of Week:  Incorrect Memory: Impaired Memory Impairment: Storage deficit;Retrieval deficit;Decreased recall of new information Immediate Memory Recall: Sock;Blue;Bed Memory Recall:  (0/3) Motor  Motor Motor: Hemiplegia;Abnormal tone;Abnormal postural alignment and control Motor - Skilled Clinical Observations: Decreased weight shift to the right wiht shortened trunk on R  Trunk/Postural Assessment  Cervical Assessment Cervical Assessment: Within Functional Limits Thoracic Assessment Thoracic Assessment: Exceptions to [facility] (kyphotic) Lumbar Assessment Lumbar Assessment: Exceptions to [facility] Postural Control Postural Control: Deficits on evaluation  Balance Balance Balance Assessed: Yes Static Sitting Balance Static  Sitting - Level of Assistance: 5: Stand by assistance Dynamic Sitting Balance Dynamic Sitting - Level of Assistance: 4: Min assist Static Standing Balance Static Standing - Level of Assistance: 3: Mod assist Dynamic Standing Balance Dynamic Standing - Level of Assistance: 2: Max assist Extremity/Trunk Assessment RUE Assessment RUE Assessment: Within Functional Limits LUE Assessment LUE Assessment: Exceptions to [facility] LUE Strength LUE Overall Strength Comments: 2-/5 shoulder, elbow, and digits. Increased tone   See Function Navigator for Current Functional Status.   Refer to Care Plan for Long Term Goals  Recommendations for other services: None    Discharge Criteria: Patient will be discharged from OT if patient refuses treatment 3 consecutive times without medical reason, if treatment goals not met, if there is a change in medical status, if patient makes no progress towards goals or if patient is discharged from hospital.  The above assessment, treatment plan, treatment alternatives and goals were discussed and mutually agreed upon: by patient and by family  Gypsy Decant 05/11/2016, 12:49 PM

## 2016-05-11 NOTE — Evaluation (Signed)
Physical Therapy Assessment and Plan  Patient Details  Name: Chloe Owens MRN: [id number] Date of Birth: 01-11-1956  PT Diagnosis: Abnormal posture, Difficulty walking, Hemiparesis non-dominant, Impaired cognition, Impaired sensation, Muscle weakness and Pain in joint Rehab Potential: Good ELOS: 14-17 days   Today's Date: 05/11/2016 PT Individual Time: 1030-1130 PT Individual Time Calculation (min): 60 min     Problem List: Patient Active Problem List   Diagnosis Date Noted  . Acute ischemic right MCA stroke ([place]) 05/10/2016  . Cognitive deficit, post-stroke   . Adjustment disorder with mixed anxiety and depressed mood   . Nausea without vomiting   . Hemiparesis of right nondominant side due to cerebral infarction ([place])   . Dysarthria, post-stroke   . Hemiparesis of left nondominant side due to cerebral infarction ([place])   . History of CVA with residual deficit   . Benign essential HTN   . Non-rheumatic mitral regurgitation   . Tobacco abuse   . Leukocytosis   . Dysphagia, post-stroke   . Acute ischemic stroke ([place]) 05/07/2016  . Depression 05/07/2016  . Generalized anxiety disorder 05/07/2016  . Ankle fracture, left 04/14/2012  . Fall due to stumbling 04/14/2012  . Hypoglycemia 04/14/2012  . Hypotension 04/14/2012  . Diabetes mellitus ([place]) 04/14/2012  . Hyperlipidemia 04/14/2012  . History of CVA (cerebrovascular accident) 04/14/2012    Past Medical History:  Past Medical History:  Diagnosis Date  . Anxiety   . Bladder infection    hx of  . Depression   . Diabetes mellitus without complication ([place])   . GERD (gastroesophageal reflux disease)   . Hyperlipidemia   . Hypertension   . Neuromuscular disorder ([place])   . Stroke [facility])    05/2008   Past Surgical History:  Past Surgical History:  Procedure Laterality Date  . ANKLE CLOSED REDUCTION  04/15/2012   Procedure: CLOSED REDUCTION ANKLE;  Surgeon: Marin Shutter, MD;  Location: [place];  Service: Orthopedics;   Laterality: Left;  . CARPAL TUNNEL RELEASE     bilaterally  . ORIF ANKLE FRACTURE  05/09/2012   Procedure: OPEN REDUCTION INTERNAL FIXATION (ORIF) ANKLE FRACTURE;  Surgeon: Wylene Simmer, MD;  Location: [place];  Service: Orthopedics;  Laterality: Left;  ORIF VERSES EXFIX LEFT ANKLE FRACTURE    Assessment & Plan Clinical Impression: Patient is a 60 y.o. female with history of T2DM, HTN, BUE tremors, CVA 2009 with memory deficits  who was admitted on 05/07/16 with slurred speech, difficulty walking and left sided weakness since evening prior to admission. MRI/MRA brain done revealing acute RIGHT MCA lenticulostriate territory infarct affecting the lentiform nucleus and posterior limb internal capsule, advanced atrophy and SVD, severe bilateral PCA disease. 2D echo with EF 65-70%, moderate LVH, aortic sclerosis without stenosis and heavily calcified MV with mild regurgitation could be source of embolic stroke. Carotid dopplers without significant ICA stenosis. 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 PT/OT evaluations done revealing LUE weakness with poor coordination, balance deficits, question of visual deficits and patient requires cues for sequencing with tasks. CIR recommended for follow up therapy  Patient transferred to CIR on 05/10/2016 .   Patient currently requires max with mobility secondary to muscle weakness, muscle joint tightness and muscle paralysis, decreased cardiorespiratoy endurance, impaired timing and sequencing, abnormal tone, unbalanced muscle activation and decreased motor planning, decreased visual perceptual skills, decreased attention to left and decreased motor planning, decreased initiation, decreased attention, decreased awareness, decreased problem solving,  decreased safety awareness, decreased memory and delayed processing and decreased sitting balance, decreased standing balance, decreased postural control, hemiplegia,  decreased balance strategies.  Prior to hospitalization, patient was independent  with mobility and lived with Spouse in a House home.  Home access is 2Stairs to enter.  Patient will benefit from skilled PT intervention to maximize safe functional mobility, minimize fall risk and decrease caregiver burden for planned discharge home with 24 hour supervision.  Anticipate patient will HHPT vs outpt PT at discharge.  PT - End of Session Activity Tolerance: Tolerates 30+ min activity with multiple rests Endurance Deficit: Yes PT Assessment Rehab Potential (ACUTE/IP ONLY): Good PT Patient demonstrates impairments in the following area(s): Balance;Endurance;Motor;Pain;Safety;Perception;Sensory;Skin Integrity PT Transfers Functional Problem(s): Bed Mobility;Bed to Chair;Car;Furniture;Floor PT Locomotion Functional Problem(s): Ambulation;Wheelchair Mobility;Stairs PT Plan PT Intensity: Minimum of 1-2 x/day ,45 to 90 minutes PT Frequency: 5 out of 7 days PT Duration Estimated Length of Stay: 14-17 days PT Treatment/Interventions: Ambulation/gait training;Stair training;Balance/vestibular training;DME/adaptive equipment instruction;Patient/family education;Therapeutic Activities;Wheelchair propulsion/positioning;Cognitive remediation/compensation;Functional mobility training;Community reintegration;Therapeutic Exercise;UE/LE Strength taining/ROM;Discharge planning;Neuromuscular re-education;Skin care/wound management;UE/LE Coordination activities;Splinting/orthotics;Psychosocial support;Disease management/prevention;Pain management;Visual/perceptual remediation/compensation PT Transfers Anticipated Outcome(s): Supervision overall PT Locomotion Anticipated Outcome(s): Supervision with ambulating household distances; may utilize manual w/c in community PT Recommendation Recommendations for Other Services: Neuropsych consult Follow Up Recommendations: Home health PT;Outpatient PT;24 hour  supervision/assistance Patient destination: Home Equipment Recommended: To be determined (Not sure what equipment she has)  Skilled Therapeutic Intervention Pt was seen today for initial evaluation.  Pt reporting increased pain in R buttock of 8/10.  This was discussed with nurse Pryor Montes.  Treatment today consisted of educating pt on how to utilize manual wheelchair, sit to stand transfers with max A, mat to bed w/c with max A, ambulation with mod A x 2 with handheld assist.  Pt demos poor weight shift over R LE with ambulation with decreased L foot clearance.   Pt was given manual cues to facilitate R sided weight shift as well as verbal cues to improve L UE clearance and activation.   PT Evaluation Precautions/Restrictions Precautions Precautions: Fall Restrictions Weight Bearing Restrictions: No Prior Functioning Home Living Available Help at Discharge: Family;Available 24 hours/day Type of Home: House Home Access: Stairs to enter [employer] of Steps: 2 Home Layout: One level  Lives With: Spouse Prior Function Level of Independence: Independent with basic ADLs;Independent with gait;Independent with homemaking with ambulation;Independent with transfers Vision/Perception   L inattention  Cognition Overall Cognitive Status: Impaired/Different from baseline (h/o memory impairments) Orientation Level: Oriented to person;Oriented to place;Oriented to situation;Oriented to time (extra time needed)   Motor  Motor Motor: Hemiplegia;Abnormal tone;Abnormal postural alignment and control Motor - Skilled Clinical Observations: Decreased weight shift to the right wiht shortened trunk on R      Trunk/Postural Assessment  Cervical Assessment Cervical Assessment: Within Functional Limits Thoracic Assessment Thoracic Assessment: Exceptions to [facility] (Increased thoracic kyphosis) Lumbar Assessment Lumbar Assessment: Exceptions to [facility] (Decreased lat trunk elongation on the  right) Postural Control Postural Control: Deficits on evaluation (Dec standing balance with post LOB)  Balance Balance Balance Assessed: Yes Static Sitting Balance Static Sitting - Level of Assistance: 5: Stand by assistance Dynamic Sitting Balance Dynamic Sitting - Level of Assistance: 4: Min assist Static Standing Balance Static Standing - Level of Assistance: 3: Mod assist Dynamic Standing Balance Dynamic Standing - Level of Assistance: 2: Max assist Extremity Assessment  RUE Assessment RUE Assessment: Within Functional Limits LUE Assessment LUE Assessment: Exceptions to [facility] LUE Strength LUE Overall Strength Comments:  L shoulder abd:  2-/5 and finger ext:  0/5 RLE Assessment RLE Assessment: Within Functional Limits LLE Assessment LLE Assessment: Exceptions to [facility] LLE Strength LLE Overall Strength Comments: 3/5 L ankle DF strength; able to activate L hip flexors approx 2/5 for bed mobility   See Function Navigator for Current Functional Status.   Refer to Care Plan for Long Term Goals  Recommendations for other services: Neuropsych and Therapeutic Recreation  Pet therapy, Kitchen group and Stress management  Discharge Criteria: Patient will be discharged from PT if patient refuses treatment 3 consecutive times without medical reason, if treatment goals not met, if there is a change in medical status, if patient makes no progress towards goals or if patient is discharged from hospital.  The above assessment, treatment plan, treatment alternatives and goals were discussed and mutually agreed upon: by patient   Hilario Quarry 05/11/2016, 12:12 PM

## 2016-05-11 NOTE — Progress Notes (Signed)
Occupational Therapy Session Note  Patient Details  Name: Chloe Owens J Jewell MRN: [id number] Date of Birth: 20-Aug-1955  Today's Date: 05/11/2016 OT Individual Time: 1300-1330 OT Individual Time Calculation (min): 30 min   Skilled Therapeutic Interventions/Progress Updates: Balance/vestibular training;Neuromuscular re-education;Self Care/advanced ADL retraining;Therapeutic Exercise;Wheelchair propulsion/positioning;DME/adaptive equipment instruction;Pain management;Skin care/wound managment;UE/LE Strength taining/ROM;Cognitive remediation/compensation;Community reintegration;Patient/family education;UE/LE Coordination activities;Discharge planning;Functional mobility training;Psychosocial support;Therapeutic Activities   1:1 Pt in bed when arrived. Focus on bed mobility to come to EOB with min A with more than reasonable amt of time. Trial use of STEDY for transfers for nursing team for timed toileting/ needs. Pt able to come into standing with mod A. At sink focused on sit to stand and standing balance during grooming tasks. Mod A for standing balance while brushing teeth. Also focus on functional use of left hand as gross assist with mod to max A with extra time. Pt transferred into recliner at end of session with STEDY. Left with family with red foam block for encouraged hand grasp and release and education on elevating UE.   Therapy Documentation Precautions:  Precautions Precautions: Fall Restrictions Weight Bearing Restrictions: No Pain: No c/o pain See Function Navigator for Current Functional Status.   Therapy/Group: Individual Therapy  Roney MansSmith, Calypso Hagarty [facility] 05/11/2016, 3:22 PM

## 2016-05-11 NOTE — Evaluation (Signed)
Speech Language Pathology Assessment and Plan  Patient Details  Name: Chloe Owens MRN: [id number] Date of Birth: 1956-06-04  SLP Diagnosis: Cognitive Impairments;Dysphagia;Dysarthria  Rehab Potential: Excellent ELOS: 14-17 days     Today's Date: 05/11/2016 SLP Individual Time: 7858-8502 SLP Individual Time Calculation (min): 60 min    Problem List:  Patient Active Problem List   Diagnosis Date Noted  . Acute ischemic right MCA stroke ([place]) 05/10/2016  . Cognitive deficit, post-stroke   . Adjustment disorder with mixed anxiety and depressed mood   . Nausea without vomiting   . Hemiparesis of right nondominant side due to cerebral infarction ([place])   . Dysarthria, post-stroke   . Hemiparesis of left nondominant side due to cerebral infarction ([place])   . History of CVA with residual deficit   . Benign essential HTN   . Non-rheumatic mitral regurgitation   . Tobacco abuse   . Leukocytosis   . Dysphagia, post-stroke   . Acute ischemic stroke ([place]) 05/07/2016  . Depression 05/07/2016  . Generalized anxiety disorder 05/07/2016  . Ankle fracture, left 04/14/2012  . Fall due to stumbling 04/14/2012  . Hypoglycemia 04/14/2012  . Hypotension 04/14/2012  . Diabetes mellitus ([place]) 04/14/2012  . Hyperlipidemia 04/14/2012  . History of CVA (cerebrovascular accident) 04/14/2012   Past Medical History:  Past Medical History:  Diagnosis Date  . Anxiety   . Bladder infection    hx of  . Depression   . Diabetes mellitus without complication (Chloe Owens)   . GERD (gastroesophageal reflux disease)   . Hyperlipidemia   . Hypertension   . Neuromuscular disorder ([place])   . Stroke [facility])    05/2008   Past Surgical History:  Past Surgical History:  Procedure Laterality Date  . ANKLE CLOSED REDUCTION  04/15/2012   Procedure: CLOSED REDUCTION ANKLE;  Surgeon: Marin Shutter, MD;  Location: [place];  Service: Orthopedics;  Laterality: Left;  . CARPAL TUNNEL RELEASE     bilaterally  . ORIF  ANKLE FRACTURE  05/09/2012   Procedure: OPEN REDUCTION INTERNAL FIXATION (ORIF) ANKLE FRACTURE;  Surgeon: Wylene Simmer, MD;  Location: [place];  Service: Orthopedics;  Laterality: Left;  ORIF VERSES EXFIX LEFT ANKLE FRACTURE    Assessment / Plan / Recommendation Clinical Impression Patient is a 60 year old female with history of T2DM, HTN, BUE tremors, CVA 2009 with memory deficits  who was admitted on 05/07/16 with slurred speech, difficulty walking and left sided weakness since evening prior to admission. MRI/MRA brain done revealing acute RIGHT MCA lenticulostriate territory infarct affecting the lentiform nucleus and posterior limb internal capsule, advanced atrophy and SVD, severe bilateral PCA disease. 2D echo with EF 65-70%, moderate LVH, aortic sclerosis without stenosis and heavily calcified MV with mild regurgitation could be source of embolic stroke. Carotid dopplers without significant ICA stenosis. 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 PT/OT evaluations done revealing LUE weakness with poor coordination, balance deficits, question of visual deficits and patient requires cues for sequencing with tasks. CIR recommended for follow up therapy and patient admitted 05/10/16.  Patient demonstrates moderate cognitive impairments impacting working memory, functional problem solving, awareness and overall safety with functional tasks. Patient also demonstrates mild left oral-motor weakness resulting in imprecise consonants which impacts patient's speech intelligibility which is further exacerbated by decreased vocal intensity. Patient is currently consuming regular textures with thin liquids without overt s/s of aspiration but requires intermittent verbal cues to self-monitor and correct mild  left buccal pocketing and anterior spillage. Recommend patient remain on regular textures with thin liquids with full supervision for use of swallowing  strategies. Patient would benefit from skilled SLP intervention to maximize her cognitive and swallowing function and speech intelligibility in order to maximize her overall functional independence prior to discharge home.    Skilled Therapeutic Interventions          Administered a cognitive-linguistic evaluation and BSE. Please see above for details.   SLP Assessment  Patient will need skilled Speech Lanaguage Pathology Services during CIR admission    Recommendations  SLP Diet Recommendations: Age appropriate regular solids;Thin Liquid Administration via: Cup;Straw Medication Administration: Whole meds with puree Supervision: Patient able to self feed;Full supervision/cueing for compensatory strategies Compensations: Slow rate;Small sips/bites;Lingual sweep for clearance of pocketing Postural Changes and/or Swallow Maneuvers: Seated upright 90 degrees Oral Care Recommendations: Oral care BID Recommendations for Other Services: Neuropsych consult Patient destination: Home Follow up Recommendations: 24 hour supervision/assistance (SLP f/u TBD) Equipment Recommended: None recommended by SLP    SLP Frequency 3 to 5 out of 7 days   SLP Duration  SLP Intensity  SLP Treatment/Interventions 14-17 days   Minumum of 1-2 x/day, 30 to 90 minutes  Cognitive remediation/compensation;Cueing hierarchy;Environmental controls;Internal/external aids;Speech/Language facilitation;Patient/family education;Functional tasks    Pain Pain Assessment Pain Assessment: No/denies pain Pain Score: 2  Pain Type: Acute pain Pain Location: Coccyx Pain Descriptors / Indicators: Aching;Discomfort Pain Intervention(s): Repositioned;  Prior Functioning Type of Home: House  Lives With: Spouse Available Help at Discharge: Family;Available 24 hours/day Vocation: On disability  Function:  Eating Eating   Modified Consistency Diet: No Eating Assist Level: Set up assist for;More than reasonable amount of  time;Supervision or verbal cues   Eating Set Up Assist For: Opening containers       Cognition Comprehension Comprehension assist level: Understands basic 90% of the time/cues < 10% of the time  Expression   Expression assist level: Expresses basic 90% of the time/requires cueing < 10% of the time.  Social Interaction Social Interaction assist level: Interacts appropriately 75 - 89% of the time - Needs redirection for appropriate language or to initiate interaction.  Problem Solving Problem solving assist level: Solves basic 50 - 74% of the time/requires cueing 25 - 49% of the time  Memory Memory assist level: Recognizes or recalls 50 - 74% of the time/requires cueing 25 - 49% of the time   Short Term Goals: Week 1: SLP Short Term Goal 1 (Week 1): Patient will utilize external aids to recall new, daily information with Mod A multimodal cues.  SLP Short Term Goal 2 (Week 1): Patient will demonstrate functional problem solving for basic tasks with Mod A multimodal cues.  SLP Short Term Goal 3 (Week 1): Patient will self-monitor and correct errors with functional tasks Mod A question cues.  SLP Short Term Goal 4 (Week 1): Patient will consume current diet with minimal overt s/s of aspiration with Mod I for use of swallowing compensatory strategies. SLP Short Term Goal 5 (Week 1): Patient will utilize an increased vocal intensity at the sentence level with Mod I to achieve 100% intelligibility.   Refer to Care Plan for Long Term Goals  Recommendations for other services: Neuropsych  Discharge Criteria: Patient will be discharged from SLP if patient refuses treatment 3 consecutive times without medical reason, if treatment goals not met, if there is a change in medical status, if patient makes no progress towards goals or if patient is discharged from hospital.  The above assessment, treatment plan, treatment alternatives and goals were discussed and mutually agreed upon: by patient  Nazanin Kinner,  Mable Lashley 05/11/2016, 2:56 PM

## 2016-05-11 NOTE — Progress Notes (Signed)
Patient information reviewed and entered into eRehab system by Aleeta Schmaltz, RN, CRRN, PPS Coordinator.  Information including medical coding and functional independence measure will be reviewed and updated through discharge.     Per nursing patient was given "Data Collection Information Summary for Patients in Inpatient Rehabilitation Facilities with attached "Privacy Act Statement-Health Care Records" upon admission.  

## 2016-05-12 ENCOUNTER — Inpatient Hospital Stay (HOSPITAL_COMMUNITY): Payer: Medicare Other

## 2016-05-12 ENCOUNTER — Inpatient Hospital Stay (HOSPITAL_COMMUNITY): Payer: Medicare Other | Admitting: Occupational Therapy

## 2016-05-12 ENCOUNTER — Inpatient Hospital Stay (HOSPITAL_COMMUNITY): Payer: Medicare Other | Admitting: Physical Therapy

## 2016-05-12 ENCOUNTER — Inpatient Hospital Stay (HOSPITAL_COMMUNITY): Payer: Medicare Other | Admitting: Speech Pathology

## 2016-05-12 LAB — CBC
HCT: 37.6 % (ref 36.0–46.0)
Hemoglobin: 12.8 g/dL (ref 12.0–15.0)
MCH: 29.4 pg (ref 26.0–34.0)
MCHC: 34 g/dL (ref 30.0–36.0)
MCV: 86.4 fL (ref 78.0–100.0)
Platelets: 240 10*3/uL (ref 150–400)
RBC: 4.35 MIL/uL (ref 3.87–5.11)
RDW: 12.5 % (ref 11.5–15.5)
WBC: 9.7 10*3/uL (ref 4.0–10.5)

## 2016-05-12 LAB — BASIC METABOLIC PANEL
Anion gap: 10 (ref 5–15)
BUN: 28 mg/dL — ABNORMAL HIGH (ref 6–20)
CO2: 28 mmol/L (ref 22–32)
Calcium: 9.3 mg/dL (ref 8.9–10.3)
Chloride: 101 mmol/L (ref 101–111)
Creatinine, Ser: 1.27 mg/dL — ABNORMAL HIGH (ref 0.44–1.00)
GFR calc Af Amer: 51 mL/min — ABNORMAL LOW (ref >60)
GFR calc non Af Amer: 44 mL/min — ABNORMAL LOW (ref >60)
Glucose, Bld: 260 mg/dL — ABNORMAL HIGH (ref 65–99)
Potassium: 4 mmol/L (ref 3.5–5.1)
Sodium: 139 mmol/L (ref 135–145)

## 2016-05-12 LAB — GLUCOSE, CAPILLARY
Glucose-Capillary: 277 mg/dL — ABNORMAL HIGH (ref 65–99)
Glucose-Capillary: 395 mg/dL — ABNORMAL HIGH (ref 65–99)
Glucose-Capillary: 129 mg/dL — ABNORMAL HIGH (ref 65–99)
Glucose-Capillary: 99 mg/dL (ref 65–99)

## 2016-05-12 MED ORDER — SODIUM CHLORIDE 0.45 % IV SOLN
INTRAVENOUS | Status: DC
Start: 2016-05-12 — End: 2016-05-17
  Administered 2016-05-12 – 2016-05-16 (×5): via INTRAVENOUS

## 2016-05-12 MED ORDER — INSULIN NPH (HUMAN) (ISOPHANE) 100 UNIT/ML ~~LOC~~ SUSP
12.0000 [IU] | Freq: Two times a day (BID) | SUBCUTANEOUS | Status: DC
  Administered 2016-05-12 – 2016-05-14 (×5): 12 [IU] via SUBCUTANEOUS
  Filled 2016-05-12: qty 10

## 2016-05-12 NOTE — Progress Notes (Signed)
Physical Therapy Session Note  Patient Details  Name: Chloe Owens MRN: [id number] Date of Birth: [birth year]  Today's Date: 05/12/2016 PT Individual Time: 6415-8309 PT Individual Time Calculation (min): 58 min    Short Term Goals: Week 1:  PT Short Term Goal 1 (Week 1): Pt will ambulate 50 ft with min A x 1. PT Short Term Goal 2 (Week 1): Pt will perform sit to stand and mat to chair transfer with min A x 1 PT Short Term Goal 3 (Week 1): Pt will perform supine to sit and sit to supine wiht min A PT Short Term Goal 4 (Week 1): Pt will ascend and descend 2 stairs with mod A.  Skilled Therapeutic Interventions/Progress Updates:    Pt resting in bed on arrival, no c/o pain, and agreeable to therapy session.  Session focus on transfers, stair negotiation, LUE NMR, and w/c positioning.    Supine>sit with min assist for LLE management.  Squat/pivot from EOB>w/c on pt's R with steady assist.  Pt requesting to toilet and taken into bathroom in w/c for time management.  Stand/pivot to toilet with steady assist and pt performed 2/3 toileting steps with steady assist for balance.    Stair negotiation x3 steps with R handrail and max assist for advancing LLE and for L knee control.  Pt ascended forward and descended backwards.    LUE NMR for fine motor control and endurance retrieving beans from theraputty.    Pt fatigued throughout session, requiring occasional seated rest breaks.    PT provided pt with apple board and more firm seat cushion for w/c with pt noting improving sitting tolerance.  Agreeable to stay in w/c at end of session.  Positioned with call bell in reach and needs met, husband present at bedside.   Therapy Documentation Precautions:  Precautions Precautions: Fall Restrictions Weight Bearing Restrictions: No   See Function Navigator for Current Functional Status.   Therapy/Group: Individual Therapy  Earnest Conroy Penven-Crew 05/12/2016, 2:59 PM

## 2016-05-12 NOTE — Progress Notes (Signed)
Speech Language Pathology Daily Session Note  Patient Details  Name: Chloe Owens MRN: [id number] Date of Birth: July 28, 1955  Today's Date: 05/12/2016 SLP Individual Time: 1330-1350 SLP Individual Time Calculation (min): 20 min  Missed 10 minutes due to pain and fatigue    Short Term Goals: Week 1: SLP Short Term Goal 1 (Week 1): Patient will utilize external aids to recall new, daily information with Mod A multimodal cues.  SLP Short Term Goal 1 - Progress (Week 1): Discontinued (comment) SLP Short Term Goal 2 (Week 1): Patient will demonstrate functional problem solving for basic tasks with Mod A multimodal cues.  SLP Short Term Goal 2 - Progress (Week 1): Discontinued (comment) SLP Short Term Goal 3 (Week 1): Patient will self-monitor and correct errors with functional tasks Mod A question cues.  SLP Short Term Goal 3 - Progress (Week 1): Discontinued (comment) SLP Short Term Goal 4 (Week 1): Patient will consume current diet with minimal overt s/s of aspiration with Mod I for use of swallowing compensatory strategies. SLP Short Term Goal 5 (Week 1): Patient will utilize an increased vocal intensity at the sentence level with Mod I to achieve 100% intelligibility.   Skilled Therapeutic Interventions: Skilled treatment session focused on cognitive goals. Upon arrival, patient lying on her side and appeared lethargic. Patient reported her bottom was hurting her and that she had been given pain medication. SLP facilitated session by providing Max A multimodal cues for recall of events from previous therapy session. Patient with a flat affect and was difficult to engage today throughout session. Patient's husband present and reported that patient is at her baseline level of cognitive functioning and requested SLP focus more on speech intelligibility and swallowing safety. Therefore, all cognitive goals were discontinued. Patient left supine on side with all needs within reach. Continue with  current plan of care.   Function:  Cognition Comprehension Comprehension assist level: Follows basic conversation/direction with no assist  Expression   Expression assist level: Expresses basic 75 - 89% of the time/requires cueing 10 - 24% of the time. Needs helper to occlude trach/needs to repeat words.  Social Interaction Social Interaction assist level: Interacts appropriately 90% of the time - Needs monitoring or encouragement for participation or interaction.  Problem Solving Problem solving assist level: Solves basic 90% of the time/requires cueing < 10% of the time  Memory Memory assist level: Recognizes or recalls 90% of the time/requires cueing < 10% of the time    Pain Pain Assessment Pain Assessment: 0-10 Pain Score: 8  Pain Location: Buttocks Pain Descriptors / Indicators: Aching Patients Stated Pain Goal: 5 Pain Intervention(s): Medication (See eMAR);Repositioned  Therapy/Group: Individual Therapy   [place] 05/12/2016, 2:55 PM

## 2016-05-12 NOTE — Progress Notes (Signed)
Physical Therapy Session Note  Patient Details  Name: Chloe Owens MRN: [id number] Date of Birth: 1955/07/09  Today's Date: 05/12/2016 PT Individual Time: 1020-1115 PT Individual Time Calculation (min): 55 min    Short Term Goals: Week 1:  PT Short Term Goal 1 (Week 1): Pt will ambulate 50 ft with min A x 1. PT Short Term Goal 2 (Week 1): Pt will perform sit to stand and mat to chair transfer with min A x 1 PT Short Term Goal 3 (Week 1): Pt will perform supine to sit and sit to supine wiht min A PT Short Term Goal 4 (Week 1): Pt will ascend and descend 2 stairs with mod A.  Skilled Therapeutic Interventions/Progress Updates:    Treatment initiated today in patient's room.  Treatment focused on improving L UE/LE strengthening, motor planning, overall endurance to improve function in home and community, and wheelchair mobilization.  Pt ambulates with mod A to facilitate weight shift over R LE as well as to promote L LE advancement.  Pt ambulates today with handheld assistance and front wheel walker.  Worked on sit to stand today for 2 x 5 reps to improve use of L LE for function.  Pt additionally performed nu-step x 6 min to address limited endurance.  Also performed toe tap to 8 inch step to facilitate L LE weight shift and promote L LE closed chain power.  Additionally requires substantial cues for utilization of L LE and L UE with wheelchair propulsion.  Occasionally demos LOB to the left with seated balance when fatigued.  Pt requires significant encouragement to participate in therapy and verbalizes fear of falling.   Pt additionally requires substantial verbal cueing for awareness of L side of body and frequently runs into items on her left.  Pt reports 7/10 back pain pre and 6/10 pain post. Therapy Documentation Precautions:  Precautions Precautions: Fall Restrictions Weight Bearing Restrictions: No   See Function Navigator for Current Functional Status.   Therapy/Group: Individual  Therapy  Rebekka Lobello Elveria RisingM Clarissa Laird 05/12/2016, 11:58 AM

## 2016-05-12 NOTE — Progress Notes (Signed)
Social Work  Social Work Assessment and Plan  Patient Details  Name: Chloe Owens MRN: [id number] Date of Birth: 06/12/1955  Today's Date: 05/12/2016  Problem List:  Patient Active Problem List   Diagnosis Date Noted  . Acute ischemic right MCA stroke ([facility]) 05/10/2016  . Cognitive deficit, post-stroke   . Adjustment disorder with mixed anxiety and depressed mood   . Nausea without vomiting   . Hemiparesis of right nondominant side due to cerebral infarction ([facility])   . Dysarthria, post-stroke   . Hemiparesis of left nondominant side due to cerebral infarction ([facility])   . History of CVA with residual deficit   . Benign essential HTN   . Non-rheumatic mitral regurgitation   . Tobacco abuse   . Leukocytosis   . Dysphagia, post-stroke   . Acute ischemic stroke ([facility]) 05/07/2016  . Depression 05/07/2016  . Generalized anxiety disorder 05/07/2016  . Ankle fracture, left 04/14/2012  . Fall due to stumbling 04/14/2012  . Hypoglycemia 04/14/2012  . Hypotension 04/14/2012  . Diabetes mellitus ([facility]) 04/14/2012  . Hyperlipidemia 04/14/2012  . History of CVA (cerebrovascular accident) 04/14/2012   Past Medical History:  Past Medical History:  Diagnosis Date  . Anxiety   . Bladder infection    hx of  . Depression   . Diabetes mellitus without complication ([facility])   . GERD (gastroesophageal reflux disease)   . Hyperlipidemia   . Hypertension   . Neuromuscular disorder ([facility])   . Stroke [facility])    05/2008   Past Surgical History:  Past Surgical History:  Procedure Laterality Date  . ANKLE CLOSED REDUCTION  04/15/2012   Procedure: CLOSED REDUCTION ANKLE;  Surgeon: Senaida LangeKevin M Supple, MD;  Location[facility];  Service: Orthopedics;  Laterality: Left;  . CARPAL TUNNEL RELEASE     bilaterally  . ORIF ANKLE FRACTURE  05/09/2012   Procedure: OPEN REDUCTION INTERNAL FIXATION (ORIF) ANKLE FRACTURE;  Surgeon: Toni ArthursJohn Hewitt, MD;  Location: [facility];  Service: Orthopedics;  Laterality: Left;  ORIF VERSES  EXFIX LEFT ANKLE FRACTURE   Social History:  reports that she has been smoking Cigarettes.  She has been smoking about 1.00 pack per day. She has never used smokeless tobacco. She reports that she does not drink alcohol or use drugs.  Family / Support Systems Marital Status: Married How Long?: 44 yrs Patient Roles: Spouse, Parent Spouse/Significant Other: spouse, Roanna EpleyGarland @ (H) [phone] or ([phone]) [phone] Children: son, Alphia KavaDoug Drinkard [facility]) @ [phone](C) [phone] Anticipated Caregiver: Arther DamesGarland Dennin, husband Ability/Limitations of Caregiver: Arther DamesGarland Cunanan presently works as a part of a Systems developerskeleton crew at a business that has shut down.  We discussed that pt. is anticipated to need 24/7 care following and IP Rehab admission.  He says he will do what is needed to care for his wife, up to and including retiring from his job Caregiver Availability: 24/7 Family Dynamics: Husband at bedside and very supportive.  Assists pt with completion of assessment interview.  Social History Preferred language: English Religion: Unknown Cultural Background: NA Education: HS Read: Yes Write: Yes Employment Status: Disabled Date Retired/Disabled/Unemployed: 2009 after 1st CVA Legal Hisotry/Current Legal Issues: None Guardian/Conservator: None - per MD, pt is not fully capable of making decisions on her own behalf- defer to spouse.   Abuse/Neglect Physical Abuse: Denies Verbal Abuse: Denies Sexual Abuse: Denies Exploitation of patient/patient's resources: Denies Self-Neglect: Denies  Emotional Status Pt's affect, behavior adn adjustment status: Pt with cognitive deficits, however, does make attempts to complete assessment.  Occasional corrections  needed from spouse at bedside.  Fairly flat affect but denies any significant emotional distress  Will monitor and refer for neuropsychology as indicated. Recent Psychosocial Issues: None - 1st CVA 2009 Pyschiatric History: Husband reports that pt has h/o depression and  was inpatiently treated approx 2-3 years ago.  He notes that her depression worsened after her first CVA. Substance Abuse History: None  Patient / Family Perceptions, Expectations & Goals Pt/Family understanding of illness & functional limitations: Pt and spouse with good understanding of her CVA and her current functional deficits/ need for CIR.  Spouse notes current cognitive deficits are not much different than baseline. Premorbid pt/family roles/activities: Pt was independent overall.  Was driving occasionally but only short distances.  Spouse was primary supporter of the home. Anticipated changes in roles/activities/participation: Spouse notes that her physical deficits are new and he will need to assume more responsibility for physical assistance. Pt/family expectations/goals: "I just hope I will get better than this."  [employer] Community Agencies: None Premorbid Home Care/DME Agencies: Other (Comment) [facility] Transportation available at discharge: yes Resource referrals recommended: Neuropsychology, Support group (specify)  Discharge Planning Living Arrangements: Spouse/significant other Support Systems: Spouse/significant other, Children Insurance Resources: [employer] Entertainment[employer]icare, Media plannerrivate Insurance (specify) Herbalist(BCBS) Financial Resources: [employer] Financial Screen Referred: No Living Expenses: Database administratorMotgage Money Management: Spouse Does the patient have any problems obtaining your medications?: No Home Management: Pt and spouse were sharing responsibilities PTA. Patient/Family Preliminary Plans: Pt to d/c home with spouse providing 24/7 supervision/ assist. Social Work Anticipated Follow Up Needs: HH/OP Expected length of stay: 14-17 days  Clinical Impression Very unfortunate woman here following a 2nd CVA with this causing more physical deficits.  Spouse notes some cognitive defs PTA and does not feel this is far from her baseline.  Husband is able and willing  to provide 24/7 support.  Notes chronic h/o of depression, however, no significant s/s at this time.  Will follow for support and d/c planning needs.  Jamel Holzmann 05/12/2016, 4:04 PM

## 2016-05-12 NOTE — IPOC Note (Signed)
Overall Plan of Care [facility](IPOC) Patient Details Name: Chloe Owens MRN: [id number] DOB: [birth year]  Admitting Diagnosis: CVA  Hospital Problems: Active Problems:   Acute ischemic stroke (HCC)   Acute ischemic right MCA stroke (HCC)   Cognitive deficit, post-stroke   Adjustment disorder with mixed anxiety and depressed mood   Nausea without vomiting   Hemiparesis of right nondominant side due to cerebral infarction (HCC)   Dysarthria, post-stroke   Hemiparesis of left nondominant side due to cerebral infarction [facility](HCC)     Functional Problem List: Nursing Bladder, Bowel, Endurance, Medication Management, Motor, Nutrition, Safety, Perception, Sensory  PT Balance, Endurance, Motor, Pain, Safety, Perception, Sensory, Skin Integrity  OT Balance, Cognition, Endurance, Motor, Pain, Safety  SLP Cognition  TR         Basic ADL's: OT Grooming, Bathing, Dressing, Toileting     Advanced  ADL's: OT       Transfers: PT Bed Mobility, Bed to Chair, Car, Furniture, Civil Service fast streamerloor  OT Toilet, Research scientist (life sciences)Tub/Shower     Locomotion: PT Ambulation, Psychologist, prison and probation servicesWheelchair Mobility, Stairs     Additional Impairments: OT Fuctional Use of Upper Extremity  SLP Communication, Social Cognition, Swallowing expression Problem Solving, Social Interaction, Memory, Awareness  TR      Anticipated Outcomes Item Anticipated Outcome  Self Feeding n/a  Swallowing  Mod I    Basic self-care  supervision  Toileting  supervision   Bathroom Transfers supervision  Bowel/Bladder  total assist bladder min assist bowel  Transfers  Supervision overall  Locomotion  Supervision with ambulating household distances; may utilize manual w/c in community  Communication  Mod I   Cognition  Supervision   Pain  n/a  Safety/Judgment  min assist   Therapy Plan: PT Intensity: Minimum of 1-2 x/day ,45 to 90 minutes PT Frequency: 5 out of 7 days PT Duration Estimated Length of Stay: 14-17 days OT Intensity: Minimum of 1-2 x/day, 45 to 90  minutes OT Frequency: 5 out of 7 days OT Duration/Estimated Length of Stay: 14-17 days SLP Intensity: Minumum of 1-2 x/day, 30 to 90 minutes SLP Frequency: 3 to 5 out of 7 days SLP Duration/Estimated Length of Stay: 14-17 days        Team Interventions: Nursing Interventions Patient/Family Education, Bladder Management, Bowel Management, Disease Management/Prevention, Medication Management, Skin Care/Wound Management, Discharge Planning  PT interventions Ambulation/gait training, Stair training, Warden/rangerBalance/vestibular training, DME/adaptive equipment instruction, Patient/family education, Therapeutic Activities, Wheelchair propulsion/positioning, Cognitive remediation/compensation, Functional mobility training, Community reintegration, Therapeutic Exercise, UE/LE Strength taining/ROM, Discharge planning, Neuromuscular re-education, Skin care/wound management, UE/LE Coordination activities, Splinting/orthotics, Psychosocial support, Disease management/prevention, Pain management, Visual/perceptual remediation/compensation  OT Interventions Balance/vestibular training, Neuromuscular re-education, Self Care/advanced ADL retraining, Therapeutic Exercise, Wheelchair propulsion/positioning, DME/adaptive equipment instruction, Pain management, Skin care/wound managment, UE/LE Strength taining/ROM, Cognitive remediation/compensation, FirefighterCommunity reintegration, Equities traderatient/family education, UE/LE Coordination activities, Discharge planning, Functional mobility training, Psychosocial support, Therapeutic Activities  SLP Interventions Cognitive remediation/compensation, Financial traderCueing hierarchy, Environmental controls, Internal/external aids, Speech/Language facilitation, Patient/family education, Functional tasks  TR Interventions    SW/CM Interventions Discharge Planning, Psychosocial Support, Patient/Family Education    Team Discharge Planning: Destination: PT-Home ,OT- Home , SLP-Home Projected Follow-up: PT-Home health  PT, Outpatient PT, 24 hour supervision/assistance, OT-  24 hour supervision/assistance, Outpatient OT, SLP-24 hour supervision/assistance (SLP f/u TBD) Projected Equipment Needs: PT-To be determined (Not sure what equipment she has), OT- To be determined, SLP-None recommended by SLP Equipment Details: PT- , OT-  Patient/family involved in discharge planning: PT- Patient,  OT-Patient, Family member/caregiver, SLP-Patient  MD ELOS: 14-17 days  Medical Rehab Prognosis:  Excellent Assessment: The patient has been admitted for CIR therapies with the diagnosis of right MCA infarct. The team will be addressing functional mobility, strength, stamina, balance, safety, adaptive techniques and equipment, self-care, bowel and bladder mgt, patient and caregiver education, NMR, swallowing, communication, cognition, community reintegration. Goals have been set at supervision for mobility, self-care, ADL's, cognition, communication and swallowing. Does have a history of anxiety disorder which may impact progress. Ranelle Oyster.    Bodie Abernethy T. Phillippe Orlick, MD, FAAPMR     See Team Conference Notes for weekly updates to the plan of care

## 2016-05-12 NOTE — Progress Notes (Signed)
[place] PHYSICAL MEDICINE & REHABILITATION     PROGRESS NOTE    Subjective/Complaints: Didn't sleep well. Restless and a little anxious. Sugars still out of control. Not drinking much.   ROS: pt denies nausea, vomiting, diarrhea, cough, shortness of breath or chest pain   Objective: Vital Signs: Blood pressure 140/70, pulse 97, temperature 97.6 F (36.4 C), temperature source Oral, resp. rate 18, SpO2 97 %. No results found.  Recent Labs  05/11/16 0521 05/12/16 0541  WBC 14.3* 9.7  HGB 12.8 12.8  HCT 38.1 37.6  PLT 250 240    Recent Labs  05/11/16 0521 05/12/16 0541  NA 136 139  K 4.5 4.0  CL 101 101  GLUCOSE 297* 260*  BUN 22* 28*  CREATININE 1.13* 1.27*  CALCIUM 9.1 9.3   CBG (last 3)   Recent Labs  05/11/16 1640 05/11/16 2117 05/12/16 0705  GLUCAP 133* 314* 277*    Wt Readings from Last 3 Encounters:  05/23/14 72.6 kg (160 lb)  05/09/12 82.1 kg (181 lb)  04/15/12 87.7 kg (193 lb 4.8 oz)    Physical Exam:  Constitutional: flat.  HENT:  Head: Normocephalic and atraumatic.  Mouth/Throat: Oropharynx is pink/moist.  Eyes: PERRL.  Neck: Normal range of motion. Neck supple.  Cardiovascular: RRR without murmur.   No murmur heard. Respiratory: CTA---bilaterally GI: Soft. Bowel sounds are normal. She exhibits no distension. There is no tenderness.  Musculoskeletal: She exhibits no edema or tenderness.  Neurological: She is alert and oriented to person, place, and time.  Left facial weakness/central 7. LUQ anopsia still Feeding self and food pocketing in mouth--not clearing completely before taking next bite of food Oriented to self/place Left inattention..  Motor: RUE/RLE: 4+/5 proximal to distal LUE/LLE: 3 to 4-/5 proximal to distal with inconsistent activation DTRs symmetric Sensation intact to light touch   Skin: Skin is warm and dry.  Psychiatric: Her affect is blunt. Her speech is delayed. She is slowed. She exhibits abnormal recent  memory and abnormal remote memory.   Assessment/Plan: 1. Functional, cognitive, and mobility deficits secondary to right MCA infarct which require 3+ hours per day of interdisciplinary therapy in a comprehensive inpatient rehab setting. Physiatrist is providing close team supervision and 24 hour management of active medical problems listed below. Physiatrist and rehab team continue to assess barriers to discharge/monitor patient progress toward functional and medical goals.  Function:  Bathing Bathing position   Position: Other (comment) (sitting on toilet)  Bathing parts Body parts bathed by patient: Right arm, Chest, Abdomen, Front perineal area, Right upper leg, Left upper leg Body parts bathed by helper: Left arm, Buttocks, Right lower leg, Left lower leg, Back  Bathing assist Assist Level:  (mod A)      Upper Body Dressing/Undressing Upper body dressing   What is the patient wearing?: Pull over shirt/dress     Pull over shirt/dress - Perfomed by patient: Thread/unthread right sleeve Pull over shirt/dress - Perfomed by helper: Thread/unthread left sleeve, Put head through opening, Pull shirt over trunk        Upper body assist Assist Level:  (max A)      Lower Body Dressing/Undressing Lower body dressing   What is the patient wearing?: Pants, Non-skid slipper socks       Pants- Performed by helper: Thread/unthread right pants leg, Thread/unthread left pants leg, Pull pants up/down   Non-skid slipper socks- Performed by helper: Don/doff right sock, Don/doff left sock  Lower body assist Assist for lower body dressing:  (total A)      Toileting Toileting     Toileting steps completed by helper: Adjust clothing prior to toileting, Adjust clothing after toileting, Performs perineal hygiene Toileting Assistive Devices: Grab bar or rail  Toileting assist Assist level:  (total A)   Transfers Chair/bed transfer   Chair/bed transfer method: Stand  pivot Chair/bed transfer assist level: Maximal assist (Pt 25 - 49%/lift and lower) Chair/bed transfer assistive device: Armrests     Locomotion Ambulation     Max distance: 20 ft Assist level: 2 helpers (Mod + 2 )   Wheelchair   Type: Manual Max wheelchair distance: 20 ft Assist Level: Touching or steadying assistance (Pt > 75%)  Cognition Comprehension Comprehension assist level: Follows basic conversation/direction with no assist  Expression Expression assist level: Expresses basic 90% of the time/requires cueing < 10% of the time.  Social Interaction Social Interaction assist level: Interacts appropriately 90% of the time - Needs monitoring or encouragement for participation or interaction.  Problem Solving Problem solving assist level: Solves basic 90% of the time/requires cueing < 10% of the time  Memory Memory assist level: Recognizes or recalls 90% of the time/requires cueing < 10% of the time   Medical Problem List and Plan: 1.  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  -continue CIR 2.  DVT Prophylaxis/Anticoagulation: Pharmaceutical: Lovenox 3. Pain Management: N/A  4. Mood: LCSW to follow for evaluation and support.  5. Neuropsych: This patient is not fully capable of making decisions on her own behalf. 6. Skin/Wound Care: routine pressure relief measures.  7. Fluids/Electrolytes/Nutrition: Monitor I/O. I personally reviewed the patient's labs today.   -BUN trending up further today---30/1.27---begin HS IVF  -follow up labs Monday 8. HTN:  Lisinopril on hold with control at present  9. T2DM:  Poorly controlled. On Lantus 18 units daily with SSI for elevated BS- .  .   -changed lantus to nph insulin 12u bid.   -continue tradjenta (on januvia at home) 10 Anxiety/depression: team to provide ego support. She was on Abilify, Klonopin and Wellbutrin at home. Klonopin resumed.  resumed low dose Wellbutrin..   -add prn  xanax for breakthrough anxiety  11. Dyslipidemia: On Lipitor.    12. Nausea:  Will resume Prilosec. Anxiety may be a component.    -ua equivocal--ucx with multispecies  -wbc's down to 9.7 today -no signs of infection on exam  LOS (Days) 2 A FACE TO FACE EVALUATION WAS PERFORMED  Granger Chui T 05/12/2016 9:36 AM

## 2016-05-12 NOTE — Progress Notes (Signed)
Occupational Therapy Session Note  Patient Details  Name: Chloe Owens J Blasdel MRN: [id number] Date of Birth: 09-01-1955  Today's Date: 05/12/2016 OT Individual Time: 0915-1000 OT Individual Time Calculation (min): 45 min     Short Term Goals:Week 1:  OT Short Term Goal 1 (Week 1): Pt will demonstrate hemiplegic techniques for UB dressing with min A. OT Short Term Goal 2 (Week 1): Pt will demonstrate hemiplegic techniques for LB dressing with mod A in order to increase I in self care. OT Short Term Goal 3 (Week 1): Pt will perform toilet transfer with mod A to decrease level of assist with functional transfer. OT Short Term Goal 4 (Week 1): Pt will maintain standing balance with mod A during LB clothing management.   Skilled Therapeutic Interventions/Progress Updates: 1:1 Pt in bed when arrived. Self care retraining at shower level. Pt able to come to EOB with mod A. Pt perform stand pivot transfer with mod A with improved ability to push up into standing with only steadying A. Mod A transfer on/ off toilet. Pt showered sitting on bench with occasional min A for balance when falling to the left and mod cues to maintain balance at midline. Dressed sit to stand at sink with focus on proper hand placement and maintaining standing balance. Hemi dressing techniques used with mod cuing for recalling them.  Returned to bed at end of session (stands steps).     Therapy Documentation Precautions:  Precautions Precautions: Fall Restrictions Weight Bearing Restrictions: No General:   Vital Signs:   Pain:   ADL:   Exercises:   Other Treatments:    See Function Navigator for Current Functional Status.   Therapy/Group: Individual Therapy  Roney MansSmith, Anastasia Tompson [facility] 05/12/2016, 9:21 AM

## 2016-05-12 NOTE — Care Management Note (Signed)
Inpatient Rehabilitation Center Individual Statement of Services  Patient Name:  Chloe Owens  Date:  05/12/2016  Welcome to the [facility]patient Rehabilitation [facility].  Our goal is to provide you with an individualized program based on your diagnosis and situation, designed to meet your specific needs.  With this comprehensive rehabilitation program, you will be expected to participate in at least 3 hours of rehabilitation therapies Monday-Friday, with modified therapy programming on the weekends.  Your rehabilitation program will include the following services:  Physical Therapy (PT), Occupational Therapy (OT), Speech Therapy (ST), 24 hour per day rehabilitation nursing, Therapeutic Recreaction (TR), Neuropsychology, Case Management (Social Worker), Rehabilitation Medicine, Nutrition Services and Pharmacy Services  Weekly team conferences will be held on Tuesdays to discuss your progress.  Your Social Worker will talk with you frequently to get your input and to update you on team discussions.  Team conferences with you and your family in attendance may also be held.  Expected length of stay: 14-17 days  Overall anticipated outcome: supervision  Depending on your progress and recovery, your program may change. Your Social Worker will coordinate services and will keep you informed of any changes. Your Social Worker's name and contact numbers are listed  below.  The following services may also be recommended but are not provided by the Inpatient Rehabilitation Center:   Driving Evaluations  Home Health Rehabiltiation Services  Outpatient Rehabilitation Services    Arrangements will be made to provide these services after discharge if needed.  Arrangements include referral to agencies that provide these services.  Your insurance has been verified to be:  Medicare and [employer] Your primary doctor is:  Dr. Garner Nashaniels  Pertinent information will be shared with your doctor and your insurance  company.  Social Worker:  [place]Lucy Shelda Truby, [place] [phone] or ([phone]) [phone]   Information discussed with and copy given to patient by: [name], [name], 05/12/2016, 12:00 PM

## 2016-05-13 ENCOUNTER — Inpatient Hospital Stay (HOSPITAL_COMMUNITY): Payer: Medicare Other

## 2016-05-13 ENCOUNTER — Inpatient Hospital Stay (HOSPITAL_COMMUNITY): Payer: Medicare Other | Admitting: Speech Pathology

## 2016-05-13 ENCOUNTER — Inpatient Hospital Stay (HOSPITAL_COMMUNITY): Payer: Medicare Other | Admitting: *Deleted

## 2016-05-13 LAB — GLUCOSE, CAPILLARY
Glucose-Capillary: 75 mg/dL (ref 65–99)
Glucose-Capillary: 208 mg/dL — ABNORMAL HIGH (ref 65–99)
Glucose-Capillary: 156 mg/dL — ABNORMAL HIGH (ref 65–99)
Glucose-Capillary: 139 mg/dL — ABNORMAL HIGH (ref 65–99)

## 2016-05-13 NOTE — Progress Notes (Signed)
Occupational Therapy Session Note  Patient Details  Name: Chloe Owens MRN: [id number] Date of Birth: December 12, 1955  Today's Date: 05/13/2016 OT Individual Time: [id number] OT Individual Time Calculation (min): 60 min     Short Term Goals: Week 1:  OT Short Term Goal 1 (Week 1): Pt will demonstrate hemiplegic techniques for UB dressing with min A. OT Short Term Goal 2 (Week 1): Pt will demonstrate hemiplegic techniques for LB dressing with mod A in order to increase I in self care. OT Short Term Goal 3 (Week 1): Pt will perform toilet transfer with mod A to decrease level of assist with functional transfer. OT Short Term Goal 4 (Week 1): Pt will maintain standing balance with mod A during LB clothing management.   Skilled Therapeutic Interventions/Progress Updates:    Pt resting in bed upon arrival.  Pt declined bathing/dressing this morning.  Pt sat EOB at supervision level.  Pt required assistance threading her LLE into pants after threading RLE. Educated pt on hemi dressing technique and pt verbalized understanding.  Pt performed squat pivot transfer to w/c with mod A and transitioned to therapy gym for LUE NMR and sitting tasks with focus on LUE elbow extension and L shoulder external rotation.  Pt sits unsupported with rounded shoulders and posterior pelvic tilt.  Pt is able to correct sitting posture but unable to maintain for more than 30 seconds without manual facilitation.  Pt exhibits weak LUE grasp/release, elbow flexion/extension.  Increased tone noted in L biceps and pectoralis. Pt returned to room and remained in w/c with QRB and half lap tray in place.  Pt educated in LUE reaching exercises and pt return demonstrated.  Therapy Documentation Precautions:  Precautions Precautions: Fall Restrictions Weight Bearing Restrictions: No   Pain: Pain Assessment Pain Assessment: 0-10 Pain Score: 8  Pain Type: Acute pain Pain Location: Back Pain Orientation: Lower Pain Descriptors /  Indicators: Aching Pain Frequency: Intermittent Pain Onset: Gradual Patients Stated Pain Goal: 4 Pain Intervention(s): RN aware and premedicated prior to therapy     See Function Navigator for Current Functional Status.   Therapy/Group: Individual Therapy  Rich BraveLanier, Chloe Owens 05/13/2016, 11:10 AM

## 2016-05-13 NOTE — Progress Notes (Signed)
[facility] MEDICINE [facility] REHABILITATION     PROGRESS NOTE    Subjective/Complaints: Pt has back pain, no prior hx , hx recent fall, no hx back surg  ROS: pt denies nausea, vomiting, diarrhea, cough, shortness of breath or chest pain   Objective: Vital Signs: Blood pressure 121/67, pulse 79, temperature 98 F (36.7 C), temperature source Oral, resp. rate 18, SpO2 100 %. Dg Sacrum/coccyx  Result Date: 05/12/2016 CLINICAL DATA:  History of fall at home with pain. Initial encounter. EXAM: SACRUM AND COCCYX - 2+ VIEW COMPARISON:  By CT 05/28/2014 FINDINGS: There is no evidence of fracture or other focal bone lesions. IMPRESSION: Negative. Electronically Signed   By: Marnee SpringJonathon  Watts M.D.   On: 05/12/2016 16:17    Recent Labs  05/11/16 0521 05/12/16 0541  WBC 14.3* 9.7  HGB 12.8 12.8  HCT 38.1 37.6  PLT 250 240    Recent Labs  05/11/16 0521 05/12/16 0541  NA 136 139  K 4.5 4.0  CL 101 101  GLUCOSE 297* 260*  BUN 22* 28*  CREATININE 1.13* 1.27*  CALCIUM 9.1 9.3   CBG (last 3)   Recent Labs  05/12/16 1624 05/12/16 2219 05/13/16 0728  GLUCAP 129* 99 75    Wt Readings from Last 3 Encounters:  05/23/14 72.6 kg (160 lb)  05/09/12 82.1 kg (181 lb)  04/15/12 87.7 kg (193 lb 4.8 oz)    Physical Exam:  Constitutional: flat.  HENT:  Head: Normocephalic and atraumatic.  Mouth/Throat: Oropharynx is pink/moist.  Eyes: PERRL.  Neck: Normal range of motion. Neck supple.  Cardiovascular: RRR without murmur.   No murmur heard. Respiratory: CTA---bilaterally GI: Soft. Bowel sounds are normal. She exhibits no distension. There is no tenderness.  Musculoskeletal: She exhibits no edema or tenderness.  Neurological: She is alert and oriented to person, place, and time.  Left facial weakness/central 7. LUQ anopsia still Feeding self and food pocketing in mouth--not clearing completely before taking next bite of food Oriented to self/place Left inattention..  Motor:  RUE/RLE: 4+/5 proximal to distal LUE/LLE: 3 to 4-/5 proximal to distal with inconsistent activation DTRs symmetric Sensation intact to light touch   Skin: Skin is warm and dry.  Psychiatric: Her affect is blunt. Her speech is delayed. She is slowed. She exhibits abnormal recent memory and abnormal remote memory.   Assessment/Plan: 1. Functional, cognitive, and mobility deficits secondary to right MCA infarct which require 3+ hours per day of interdisciplinary therapy in a comprehensive inpatient rehab setting. Physiatrist is providing close team supervision and 24 hour management of active medical problems listed below. Physiatrist and rehab team continue to assess barriers to discharge/monitor patient progress toward functional and medical goals.  Function:  Bathing Bathing position   Position: Shower  Bathing parts Body parts bathed by patient: Right arm, Chest, Abdomen, Front perineal area, Right upper leg, Left upper leg, Buttocks Body parts bathed by helper: Right lower leg, Left lower leg, Back, Left arm  Bathing assist Assist Level: Touching or steadying assistance(Pt > 75%)      Upper Body Dressing/Undressing Upper body dressing   What is the patient wearing?: Pull over shirt/dress     Pull over shirt/dress - Perfomed by patient: Thread/unthread right sleeve, Put head through opening Pull over shirt/dress - Perfomed by helper: Thread/unthread left sleeve, Pull shirt over trunk        Upper body assist Assist Level: Touching or steadying assistance(Pt > 75%)      Lower Body Dressing/Undressing Lower  body dressing   What is the patient wearing?: Pants, Non-skid slipper socks     Pants- Performed by patient: Thread/unthread right pants leg Pants- Performed by helper: Thread/unthread left pants leg, Pull pants up/down Non-skid slipper socks- Performed by patient: Don/doff right sock, Don/doff left sock Non-skid slipper socks- Performed by helper: Don/doff right sock,  Don/doff left sock                  Lower body assist Assist for lower body dressing: Touching or steadying assistance (Pt > 75%)      Toileting Toileting   Toileting steps completed by patient: Performs perineal hygiene, Adjust clothing prior to toileting Toileting steps completed by helper: Adjust clothing after toileting Toileting Assistive Devices: Grab bar or rail  Toileting assist Assist level: Touching or steadying assistance (Pt.75%)   Transfers Chair/bed transfer   Chair/bed transfer method: Squat pivot Chair/bed transfer assist level: Touching or steadying assistance (Pt > 75%) Chair/bed transfer assistive device: Armrests     Locomotion Ambulation     Max distance: 50 ft  Assist level: Moderate assist (Pt 50 - 74%)   Wheelchair   Type: Manual Max wheelchair distance: 150 ft Assist Level: Maximal assistance (Pt 25 - 49%)  Cognition Comprehension Comprehension assist level: Follows basic conversation/direction with no assist  Expression Expression assist level: Expresses basic 75 - 89% of the time/requires cueing 10 - 24% of the time. Needs helper to occlude trach/needs to repeat words.  Social Interaction Social Interaction assist level: Interacts appropriately 90% of the time - Needs monitoring or encouragement for participation or interaction.  Problem Solving Problem solving assist level: Solves basic 90% of the time/requires cueing < 10% of the time  Memory Memory assist level: Recognizes or recalls 90% of the time/requires cueing < 10% of the time   Medical Problem List and Plan: 1.  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  -continue CIR, PT, OT, SLP 2.  DVT Prophylaxis/Anticoagulation: Pharmaceutical: Lovenox 3. Pain Management: Back/sacral pain, xray neg likely contusion, Kpad and analgesic 4. Mood: LCSW to follow for evaluation and support.  5. Neuropsych: This patient is not fully capable of  making decisions on her own behalf. 6. Skin/Wound Care: routine pressure relief measures.  7. Fluids/Electrolytes/Nutrition: Monitor I/O. I personally reviewed the patient's labs today.   -BUN trending up further today---30/1.27---begin HS IVF  -follow up labs Monday 8. HTN:  Lisinopril on hold with control at present  9. T2DM:  controlled On Lantus 18 units daily with SSI for elevated BS- .  .   -changed lantus to nph insulin 12u bid.   -continue tradjenta (on januvia at home) Monitor for hypoglycemia CBG (last 3)   Recent Labs  05/12/16 1624 05/12/16 2219 05/13/16 0728  GLUCAP 129* 99 75    10 Anxiety/depression: team to provide ego support. She was on Abilify, Klonopin and Wellbutrin at home. Klonopin resumed.  resumed low dose Wellbutrin..   -add prn xanax for breakthrough anxiety , will need to monitor for sedation 11. Dyslipidemia: On Lipitor.    12. Nausea:  Will resume Prilosec. Anxiety may be a component.    -ua equivocal--ucx with multispecies  -wbc's down to 9.7 today -no signs of infection on exam  LOS (Days) 3 A FACE TO FACE EVALUATION WAS PERFORMED  Desman Polak E 05/13/2016 10:12 AM

## 2016-05-13 NOTE — Progress Notes (Signed)
Occupational Therapy Session Note  Patient Details  Name: Chloe Owens MRN: [id number] Date of Birth: May 14, 1956  Today's Date: 05/13/2016 OT Individual Time: 1300-1400 OT Individual Time Calculation (min): 60 min     Short Term Goals:Week 1:  OT Short Term Goal 1 (Week 1): Pt will demonstrate hemiplegic techniques for UB dressing with min A. OT Short Term Goal 2 (Week 1): Pt will demonstrate hemiplegic techniques for LB dressing with mod A in order to increase I in self care. OT Short Term Goal 3 (Week 1): Pt will perform toilet transfer with mod A to decrease level of assist with functional transfer. OT Short Term Goal 4 (Week 1): Pt will maintain standing balance with mod A during LB clothing management.   Skilled Therapeutic Interventions/Progress Updates:    Treatment session with focus on functional transfers, sit > stand, and LUE NMR. Pt received in w/c finishing lunch.  Completed stand pivot transfer to toilet with min-mod assist with pt requiring lifting assistance from toilet seat.  Manual facilitation for weight shifting to Rt due to Lt lean in standing with hygiene and clothing management.  Completed stand pivot to therapy mat with use of RW, min cues for improved hand placement during sit > stand and cues for stepping with LLE during transfer.  Utilized UE Ranger to increase LUE ROM with focus on shoulder flexion and elbow extension secondary to tone.  Visual cues to improve technique.  Engaged in weight bearing and passive stretch through LUE, with arm placed in external rotation with therapist providing manual facilitation to elicit stretch.  Following stretch, engaged in hand over hand reaching for cards on vertical board with focus on shoulder flexion and elbow extension to reach for cards and then reaching outside to Lt to further facilitate stretch to release cards.    Therapy Documentation Precautions:  Precautions Precautions: Fall Restrictions Weight Bearing  Restrictions: No Pain: Pain Assessment Pain Score: 8  Stomach, RN aware and provided maalox  See Function Navigator for Current Functional Status.   Therapy/Group: Individual Therapy  Rosalio LoudHOXIE, Delbert Darley 05/13/2016, 1:42 PM

## 2016-05-13 NOTE — Progress Notes (Signed)
Speech Language Pathology Daily Session Note  Patient Details  Name: Aldona Barerry J Berres MRN: [id number] Date of Birth: 15-Nov-1955  Today's Date: 05/13/2016 SLP Individual Time: 1115-1200 SLP Individual Time Calculation (min): 45 min   Short Term Goals: Week 1: SLP Short Term Goal 1 (Week 1): Patient will utilize external aids to recall new, daily information with Mod A multimodal cues.  SLP Short Term Goal 1 - Progress (Week 1): Discontinued (comment) SLP Short Term Goal 2 (Week 1): Patient will demonstrate functional problem solving for basic tasks with Mod A multimodal cues.  SLP Short Term Goal 2 - Progress (Week 1): Discontinued (comment) SLP Short Term Goal 3 (Week 1): Patient will self-monitor and correct errors with functional tasks Mod A question cues.  SLP Short Term Goal 3 - Progress (Week 1): Discontinued (comment) SLP Short Term Goal 4 (Week 1): Patient will consume current diet with minimal overt s/s of aspiration with Mod I for use of swallowing compensatory strategies. SLP Short Term Goal 5 (Week 1): Patient will utilize an increased vocal intensity at the sentence level with Mod I to achieve 100% intelligibility.   Skilled Therapeutic Interventions: Skilled treatment session focused on speech intelligibility goals. SLP facilitated session by providing Max A multimodal cues faded to Mod A verbal cues for increasing volume and using precise articulation at the phrase level. Pt and husband participated in barrier tasks at the phrase level then sentence level. Pt able to achieve ~90% intelligibility at the phrase level and ~75% intelligibility at the sentence level in mildly noisy environment. Pt required Min A verbal cues for redirection to task and encouragement to complete tasks. Pt left in wheelchair with safety belt donned and in husband's presence. Continue current plan of care.   Function:    Cognition Comprehension Comprehension assist level: Follows basic  conversation/direction with no assist  Expression   Expression assist level: Expresses basic 75 - 89% of the time/requires cueing 10 - 24% of the time. Needs helper to occlude trach/needs to repeat words.  Social Interaction Social Interaction assist level: Interacts appropriately 90% of the time - Needs monitoring or encouragement for participation or interaction.  Problem Solving Problem solving assist level: Solves basic 75 - 89% of the time/requires cueing 10 - 24% of the time  Memory Memory assist level: Recognizes or recalls 75 - 89% of the time/requires cueing 10 - 24% of the time    Pain Pain Assessment Pain Assessment: 0-10 Pain Score: 8  Pain Type: Acute pain Pain Location: Back Pain Orientation: Lower Pain Descriptors / Indicators: Aching Pain Frequency: Intermittent Pain Onset: Gradual Patients Stated Pain Goal: 4 Pain Intervention(s): Medication (See eMAR)  Therapy/Group: Individual Therapy  Verlia Kaney 05/13/2016, 12:04 PM

## 2016-05-13 NOTE — Progress Notes (Signed)
Occupational Therapy Note  Patient Details  Name: Aldona Barerry J Emme MRN: [id number] Date of Birth: Nov 22, 1955  Today's Date: 05/13/2016 OT Individual Time: 1400-1430 OT Individual Time Calculation (min): 30 min    Pt c/o ongoing back pain; RN aware and repositioned Individual Therapy  Pt sitting EOM with husband present.  Pt had just completed earlier OT session.  Focus this session on sit<>stand, standing balance, weight shifts, reciprocal scooting EOM and in w/c and LUE elbow extension when reaching for object.  Pt engaged in LUE extension and hip flexion with BUE placed on large therapy ball and pushing ball away and towards seated position.  Pt stated she could feel stretch but it was not uncomfortable.  Pt required manual facilitation to initiate weight shifts while standing.  Pt completed stand pivot transfers X 4 with min A and mod verbal cues for sequencing.  Pt returned to bed and remained in bed with all needs within reach and bed alarm activated.  Husband present.  Lavone NeriLanier, [place] [facility] LP[facility]appell 05/13/2016, 3:26 PM

## 2016-05-14 LAB — GLUCOSE, CAPILLARY
Glucose-Capillary: 239 mg/dL — ABNORMAL HIGH (ref 65–99)
Glucose-Capillary: 303 mg/dL — ABNORMAL HIGH (ref 65–99)
Glucose-Capillary: 170 mg/dL — ABNORMAL HIGH (ref 65–99)
Glucose-Capillary: 280 mg/dL — ABNORMAL HIGH (ref 65–99)

## 2016-05-14 NOTE — Progress Notes (Signed)
[facility] PHYSICAL MEDICINE & REHABILITATION     PROGRESS NOTE    Subjective/Complaints: Low back pain non radiating no relief with heat, more specifically in sacral area  ROS: pt denies nausea, vomiting, diarrhea, cough, shortness of breath or chest pain   Objective: Vital Signs: Blood pressure 116/80, pulse 99, temperature 98.9 F (37.2 C), temperature source Oral, resp. rate 16, SpO2 100 %. Dg Sacrum/coccyx  Result Date: 05/12/2016 CLINICAL DATA:  History of fall at home with pain. Initial encounter. EXAM: SACRUM AND COCCYX - 2+ VIEW COMPARISON:  By CT 05/28/2014 FINDINGS: There is no evidence of fracture or other focal bone lesions. IMPRESSION: Negative. Electronically Signed   By: Marnee SpringJonathon  Watts M.D.   On: 05/12/2016 16:17    Recent Labs  05/12/16 0541  WBC 9.7  HGB 12.8  HCT 37.6  PLT 240    Recent Labs  05/12/16 0541  NA 139  K 4.0  CL 101  GLUCOSE 260*  BUN 28*  CREATININE 1.27*  CALCIUM 9.3   CBG (last 3)   Recent Labs  05/13/16 1652 05/13/16 2040 05/14/16 0633  GLUCAP 156* 139* 239*    Wt Readings from Last 3 Encounters:  05/23/14 72.6 kg (160 lb)  05/09/12 82.1 kg (181 lb)  04/15/12 87.7 kg (193 lb 4.8 oz)    Physical Exam:  Constitutional: flat.  HENT:  Head: Normocephalic and atraumatic.  Mouth/Throat: Oropharynx is pink/moist.  Eyes: PERRL.  Neck: Normal range of motion. Neck supple.  Cardiovascular: RRR without murmur.   No murmur heard. Respiratory: CTA---bilaterally GI: Soft. Bowel sounds are normal. She exhibits no distension. There is no tenderness.  Musculoskeletal: She exhibits no edema or tenderness.  Neurological: She is alert and oriented to person, place, and time.  Left facial weakness/central 7. LUQ anopsia still  Oriented to self/place Left inattention..  Motor: RUE/RLE: 4+/5 proximal to distal LUE/LLE: 3 to 4-/5 proximal to distal with inconsistent activation DTRs symmetric Sensation intact to light touch    Skin: Skin is warm and dry. No sacral breakdown Psychiatric: Her affect is blunt. Her speech is delayed. She is slowed. She exhibits abnormal recent memory and abnormal remote memory.   Assessment/Plan: 1. Functional, cognitive, and mobility deficits secondary to right MCA infarct which require 3+ hours per day of interdisciplinary therapy in a comprehensive inpatient rehab setting. Physiatrist is providing close team supervision and 24 hour management of active medical problems listed below. Physiatrist and rehab team continue to assess barriers to discharge/monitor patient progress toward functional and medical goals.  Function:  Bathing Bathing position   Position: Shower  Bathing parts Body parts bathed by patient: Right arm, Chest, Abdomen, Front perineal area, Right upper leg, Left upper leg, Buttocks Body parts bathed by helper: Right lower leg, Left lower leg, Back, Left arm  Bathing assist Assist Level: Touching or steadying assistance(Pt > 75%)      Upper Body Dressing/Undressing Upper body dressing   What is the patient wearing?: Pull over shirt/dress     Pull over shirt/dress - Perfomed by patient: Thread/unthread right sleeve, Put head through opening Pull over shirt/dress - Perfomed by helper: Thread/unthread left sleeve, Pull shirt over trunk        Upper body assist Assist Level: Touching or steadying assistance(Pt > 75%)      Lower Body Dressing/Undressing Lower body dressing   What is the patient wearing?: Pants, Non-skid slipper socks     Pants- Performed by patient: Thread/unthread right pants leg Pants- Performed by  helper: Thread/unthread left pants leg, Pull pants up/down Non-skid slipper socks- Performed by patient: Don/doff right sock, Don/doff left sock Non-skid slipper socks- Performed by helper: Don/doff right sock, Don/doff left sock                  Lower body assist Assist for lower body dressing: Touching or steadying assistance (Pt >  75%)      Toileting Toileting   Toileting steps completed by patient: Performs perineal hygiene Toileting steps completed by helper: Adjust clothing prior to toileting, Adjust clothing after toileting Toileting Assistive Devices: Grab bar or rail  Toileting assist Assist level:  (Max assist)   Transfers Chair/bed transfer   Chair/bed transfer method: Squat pivot Chair/bed transfer assist level: Maximal assist (Pt 25 - 49%/lift and lower) Chair/bed transfer assistive device: Armrests     Locomotion Ambulation     Max distance: 50 ft  Assist level: Moderate assist (Pt 50 - 74%)   Wheelchair   Type: Manual Max wheelchair distance: 150 ft Assist Level: Maximal assistance (Pt 25 - 49%)  Cognition Comprehension Comprehension assist level: Follows basic conversation/direction with no assist  Expression Expression assist level: Expresses basic 75 - 89% of the time/requires cueing 10 - 24% of the time. Needs helper to occlude trach/needs to repeat words.  Social Interaction Social Interaction assist level: Interacts appropriately 90% of the time - Needs monitoring or encouragement for participation or interaction.  Problem Solving Problem solving assist level: Solves basic 75 - 89% of the time/requires cueing 10 - 24% of the time  Memory Memory assist level: Recognizes or recalls 75 - 89% of the time/requires cueing 10 - 24% of the time   Medical Problem List and Plan: 1.  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  -continue CIR, PT, OT, SLP 2.  DVT Prophylaxis/Anticoagulation: Pharmaceutical: Lovenox 3. Pain Management: Back/sacral pain, xray neg likely contusion, Kpad and GRX balm 4. Mood: LCSW to follow for evaluation and support.  5. Neuropsych: This patient is not fully capable of making decisions on her own behalf. 6. Skin/Wound Care: routine pressure relief measures.  7. Fluids/Electrolytes/Nutrition: Monitor I/O. I  personally reviewed the patient's labs today.   -BUN trending up further today---30/1.27---begin HS IVF  -follow up labs Monday 8. HTN:  Lisinopril on hold with control at present  9. T2DM:  controlled On Lantus 18 units daily with SSI for elevated BS- .  .   -changed lantus to nph insulin 12u bid.   -continue tradjenta (on januvia at home) Monitor for hypoglycemia CBG (last 3)   Recent Labs  05/13/16 1652 05/13/16 2040 05/14/16 0633  GLUCAP 156* 139* 239*    10 Anxiety/depression: team to provide ego support. She was on Abilify, Klonopin and Wellbutrin at home. Klonopin resumed.  resumed low dose Wellbutrin..   -add prn xanax for breakthrough anxiety , will need to monitor for sedation 11. Dyslipidemia: On Lipitor.    12. Nausea:  Will resume Prilosec. Anxiety may be a component.    -ua equivocal--ucx with multispecies  -wbc's down to 9.7 today -no signs of infection on exam  LOS (Days) 4 A FACE TO FACE EVALUATION WAS PERFORMED  Donie Moulton E 05/14/2016 10:18 AM

## 2016-05-15 ENCOUNTER — Encounter (HOSPITAL_COMMUNITY): Payer: Medicare Other

## 2016-05-15 ENCOUNTER — Inpatient Hospital Stay (HOSPITAL_COMMUNITY): Payer: Medicare Other | Admitting: Occupational Therapy

## 2016-05-15 ENCOUNTER — Inpatient Hospital Stay (HOSPITAL_COMMUNITY): Payer: Medicare Other | Admitting: Speech Pathology

## 2016-05-15 ENCOUNTER — Inpatient Hospital Stay (HOSPITAL_COMMUNITY): Payer: Medicare Other

## 2016-05-15 LAB — BASIC METABOLIC PANEL
Anion gap: 9 (ref 5–15)
BUN: 10 mg/dL (ref 6–20)
CO2: 24 mmol/L (ref 22–32)
Calcium: 8.7 mg/dL — ABNORMAL LOW (ref 8.9–10.3)
Chloride: 104 mmol/L (ref 101–111)
Creatinine, Ser: 0.81 mg/dL (ref 0.44–1.00)
GFR calc Af Amer: >60 mL/min (ref >60)
GFR calc non Af Amer: >60 mL/min (ref >60)
Glucose, Bld: 253 mg/dL — ABNORMAL HIGH (ref 65–99)
Potassium: 3.5 mmol/L (ref 3.5–5.1)
Sodium: 137 mmol/L (ref 135–145)

## 2016-05-15 LAB — GLUCOSE, CAPILLARY
Glucose-Capillary: 244 mg/dL — ABNORMAL HIGH (ref 65–99)
Glucose-Capillary: 332 mg/dL — ABNORMAL HIGH (ref 65–99)
Glucose-Capillary: 166 mg/dL — ABNORMAL HIGH (ref 65–99)
Glucose-Capillary: 229 mg/dL — ABNORMAL HIGH (ref 65–99)

## 2016-05-15 MED ORDER — INSULIN NPH (HUMAN) (ISOPHANE) 100 UNIT/ML ~~LOC~~ SUSP
16.0000 [IU] | Freq: Two times a day (BID) | SUBCUTANEOUS | Status: DC
  Administered 2016-05-15 – 2016-05-19 (×8): 16 [IU] via SUBCUTANEOUS
  Filled 2016-05-15: qty 10

## 2016-05-15 NOTE — Progress Notes (Signed)
Occupational Therapy Session Note  Patient Details  Name: Chloe Owens MRN: [id number] Date of Birth: 12/13/1955  Today's Date: 05/15/2016 OT Individual Time: 1500-1530 OT Individual Time Calculation (min): 30 min   Short Term Goals: Week 1:  OT Short Term Goal 1 (Week 1): Pt will demonstrate hemiplegic techniques for UB dressing with min A. OT Short Term Goal 2 (Week 1): Pt will demonstrate hemiplegic techniques for LB dressing with mod A in order to increase I in self care. OT Short Term Goal 3 (Week 1): Pt will perform toilet transfer with mod A to decrease level of assist with functional transfer. OT Short Term Goal 4 (Week 1): Pt will maintain standing balance with mod A during LB clothing management.   Skilled Therapeutic Interventions/Progress Updates:    1:1 OT session focused on L NMR, hand-eye coordination, and LB strengthening. Pt greeted semi-reclined in bed, declined OOB activities but agreeable to bed level there-ex. Pt came to side-lying for gravity eliminated L UE NMR w/ joint input to bring pt through full ROM. Shoulder flex/ext, elbow flex/ext, shoulder protraction/retraction. Hip bridges w/ LLE stabilized at knee and ankle, hip adduction using pillow squeeze, and straight leg raise. L hand-eye coordination incorporating finger isolation to reach in all 4 quadrants. Pt left semi-reclined in bed at end of session with needs met and spouse present.   Therapy Documentation Precautions:  Precautions Precautions: Fall Restrictions Weight Bearing Restrictions: No Pain: Pain Assessment Pain Assessment: No/denies pain  See Function Navigator for Current Functional Status.   Therapy/Group: Individual Therapy  Valma Cava 05/15/2016, 3:32 PM

## 2016-05-15 NOTE — Progress Notes (Signed)
[place] PHYSICAL MEDICINE & REHABILITATION     PROGRESS NOTE    Subjective/Complaints: Up with OT bathing. Denies any complaints this morning.   ROS: pt denies nausea, vomiting, diarrhea, cough, shortness of breath or chest pain  Objective: Vital Signs: Blood pressure (!) 157/72, pulse 81, temperature 98.2 F (36.8 C), temperature source Oral, resp. rate 18, SpO2 95 %. No results found. No results for input(s): WBC, HGB, HCT, PLT in the last 72 hours.  Recent Labs  05/15/16 0544  NA 137  K 3.5  CL 104  GLUCOSE 253*  BUN 10  CREATININE 0.81  CALCIUM 8.7*   CBG (last 3)   Recent Labs  05/14/16 1700 05/14/16 2119 05/15/16 0653  GLUCAP 170* 280* 244*    Wt Readings from Last 3 Encounters:  05/23/14 72.6 kg (160 lb)  05/09/12 82.1 kg (181 lb)  04/15/12 87.7 kg (193 lb 4.8 oz)    Physical Exam:  Constitutional: flat.  HENT:  Head: Normocephalic and atraumatic.  Mouth/Throat: Oropharynx is pink/moist.  Eyes: PERRL.  Neck: Normal range of motion. Neck supple.  Cardiovascular: RRR.   No murmur heard. Respiratory: CTA---bilaterally GI: Soft. Bowel sounds are normal. She exhibits no distension. There is no tenderness.  Musculoskeletal: She exhibits no edema or tenderness.  Neurological: She is alert and oriented to person, place, and time.  Left facial weakness/central 7. LUQ anopsia remains  Oriented to self/place Left inattention..  Motor: RUE/RLE: 4+/5 proximal to distal LUE/LLE: 3 to 4-/5 proximal to distal with inconsistent activation DTRs symmetric Sensation intact to light touch   Skin: Skin is warm and dry. No sacral breakdown Psychiatric: Her affect is blunt. Her speech is delayed. She is slowed. She exhibits abnormal recent memory and abnormal remote memory.   Assessment/Plan: 1. Functional, cognitive, and mobility deficits secondary to right MCA infarct which require 3+ hours per day of interdisciplinary therapy in a comprehensive inpatient  rehab setting. Physiatrist is providing close team supervision and 24 hour management of active medical problems listed below. Physiatrist and rehab team continue to assess barriers to discharge/monitor patient progress toward functional and medical goals.  Function:  Bathing Bathing position   Position: Other (comment) (Sitting on BSC)  Bathing parts Body parts bathed by patient: Right arm, Chest, Abdomen, Front perineal area, Right upper leg, Left upper leg, Buttocks, Left arm, Right lower leg, Left lower leg Body parts bathed by helper: Back  Bathing assist Assist Level: Touching or steadying assistance(Pt > 75%)      Upper Body Dressing/Undressing Upper body dressing   What is the patient wearing?: Pull over shirt/dress     Pull over shirt/dress - Perfomed by patient: Thread/unthread right sleeve, Put head through opening, Pull shirt over trunk Pull over shirt/dress - Perfomed by helper: Thread/unthread left sleeve        Upper body assist Assist Level: Touching or steadying assistance(Pt > 75%)      Lower Body Dressing/Undressing Lower body dressing   What is the patient wearing?: Pants, Non-skid slipper socks     Pants- Performed by patient: Thread/unthread right pants leg Pants- Performed by helper: Thread/unthread left pants leg, Pull pants up/down Non-skid slipper socks- Performed by patient: Don/doff right sock, Don/doff left sock Non-skid slipper socks- Performed by helper: Don/doff right sock, Don/doff left sock                  Lower body assist Assist for lower body dressing: Touching or steadying assistance (Pt > 75%)  Toileting Toileting   Toileting steps completed by patient: Performs perineal hygiene Toileting steps completed by helper: Adjust clothing prior to toileting, Adjust clothing after toileting Toileting Assistive Devices: Grab bar or rail  Toileting assist Assist level: Touching or steadying assistance (Pt.75%)    Transfers Chair/bed transfer   Chair/bed transfer method: Squat pivot Chair/bed transfer assist level: Maximal assist (Pt 25 - 49%/lift and lower) Chair/bed transfer assistive device: Armrests     Locomotion Ambulation     Max distance: 50 ft  Assist level: Moderate assist (Pt 50 - 74%)   Wheelchair   Type: Manual Max wheelchair distance: 150 ft Assist Level: Maximal assistance (Pt 25 - 49%)  Cognition Comprehension Comprehension assist level: Follows basic conversation/direction with no assist  Expression Expression assist level: Expresses basic 75 - 89% of the time/requires cueing 10 - 24% of the time. Needs helper to occlude trach/needs to repeat words.  Social Interaction Social Interaction assist level: Interacts appropriately 90% of the time - Needs monitoring or encouragement for participation or interaction.  Problem Solving Problem solving assist level: Solves basic 75 - 89% of the time/requires cueing 10 - 24% of the time  Memory Memory assist level: Recognizes or recalls 75 - 89% of the time/requires cueing 10 - 24% of the time   Medical Problem List and Plan: 1.  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  -continue CIR, PT, OT, SLP 2.  DVT Prophylaxis/Anticoagulation: Pharmaceutical: Lovenox 3. Pain Management: Back/sacral pain. ?contusion  - Kpad and  balm 4. Mood: LCSW to follow for evaluation and support.  5. Neuropsych: This patient is not fully capable of making decisions on her own behalf. 6. Skin/Wound Care: routine pressure relief measures.  7. Fluids/Electrolytes/Nutrition: Monitor I/O. I personally reviewed the patient's labs today.   -BUN improved today--continue HS IVF for now 8. HTN:  Lisinopril on hold with control at present  9. T2DM:  Had been on Lantus 18 units daily with SSI for elevated BS- .  .   -changed lantus to nph insulin 12u bid---still poorly controlled. Increase to 16u bid  -continue  tradjenta (on januvia at home) Monitor for hypoglycemia CBG (last 3)   Recent Labs  05/14/16 1700 05/14/16 2119 05/15/16 0653  GLUCAP 170* 280* 244*    10 Anxiety/depression: team to provide ego support. She was on Abilify, Klonopin and Wellbutrin at home. Klonopin resumed.  resumed low dose Wellbutrin..   -add edprn xanax for breakthrough anxiety   11. Dyslipidemia: On Lipitor.    12. Nausea:  Will resume Prilosec. Anxiety may be a component.    -ua equivocal--ucx with multispecies  -wbc's down to 9.7 most recently   LOS (Days) 5 A FACE TO FACE EVALUATION WAS PERFORMED  Anne Sebring T 05/15/2016 8:55 AM

## 2016-05-15 NOTE — Progress Notes (Addendum)
Physical Therapy Session Note  Patient Details  Name: Chloe Owens MRN: [id number] Date of Birth: 04/08/1956  Today's Date: 05/15/2016 PT Individual Time: 1005-1105 PT Individual Time Calculation (min): 60 min    Skilled Therapeutic Interventions/Progress Updates:    Treatment initiated with pt in room with pt sitting up in wheelchair.  Treatment today focused on improving problem solving skills during function, improving step length with ambulation with front wheel walker, and motor planning with stairs. Pt wheeled to therapy gym and back in wheelchair.  Pt demos improved step length with t-band tied across walker was visual/external cue to promote improved step length.  Pt continues to require significant verbal cueing for planning through a functional task.  Specifically, when attempting to turn walker around during ambulation in a tight space, pt will lose focus on location of lower extremities while trying to maneuver which substantially hinders pt safety.  Pt does show fair to good static balance while performing upper extremity task in standing.  Requires assistance for advancing L LE with stairs as well.    Therapy Documentation Precautions:  Precautions Precautions: Fall Restrictions Weight Bearing Restrictions: No     Pain:  8/10 pre and post in low back.  Had negative x-ray of sacrum.    See Function Navigator for Current Functional Status.   Therapy/Group: Individual Therapy  [name] 05/15/2016, 12:57 PM

## 2016-05-15 NOTE — Progress Notes (Signed)
Occupational Therapy Session Note  Patient Details  Name: Chloe Owens MRN: [id number] Date of Birth: [birth year]  Today's Date: 05/15/2016 OT Individual Time: [id number] OT Individual Time Calculation (min): 60 min     Short Term Goals:Week 1:  OT Short Term Goal 1 (Week 1): Pt will demonstrate hemiplegic techniques for UB dressing with min A. OT Short Term Goal 2 (Week 1): Pt will demonstrate hemiplegic techniques for LB dressing with mod A in order to increase I in self care. OT Short Term Goal 3 (Week 1): Pt will perform toilet transfer with mod A to decrease level of assist with functional transfer. OT Short Term Goal 4 (Week 1): Pt will maintain standing balance with mod A during LB clothing management.   Skilled Therapeutic Interventions/Progress Updates:    Pt seen for OT ADL bathing/dressing session. Pt in supine upon arrival, agreeable to tx session. She transferred to EOB with assist to shift L hip forward to EOB. Stand pivot transfer completed EOB> BSC transferring to pt's L side. Pt with strong L lean requiring mod-max A withVCs for midline orientation and safe [facility]. Pt bathed seated on BSC verbal and tactile cueing for intiiating use of L UE into functional task. Intermittent steadying assist required for dynamic sitting balance during bathing/dressing task. Reviewed hemi dressing technique, and pt with poor carry over of technique from one clothing item to the next. She required multiple trials of sit <> stand to pull brief and pants up due to decreased standing tolerance and balance with mod A standing balance due to L lean. Pt able to correct with VCs however, unable to maintain for extended period of time.  Pt ate breakfast seated EOB. Hand over hand assist provided for use of L UE at stabilizer level. She ate breakfast with VCs to maintain swallowing pre-cautions per SLP orders. Pt returned to w/c at end of session, transferring in same method as described above. Pt  left seated in w/c with QRB donned, lap tray in place, and all needs in reach.   Therapy Documentation Precautions:  Precautions Precautions: Fall Restrictions Weight Bearing Restrictions: No Pain:   Voiced soreness in lower back from laying in bed, repositioned to w/c.   See Function Navigator for Current Functional Status.   Therapy/Group: Individual Therapy  Lewis, Yehoshua Vitelli C 05/15/2016, 7:06 AM

## 2016-05-15 NOTE — Progress Notes (Signed)
Speech Language Pathology Daily Session Note  Patient Details  Name: Chloe Owens J Anstead MRN: [id number] Date of Birth: Dec 17, 1955  Today's Date: 05/15/2016 SLP Individual Time: 1310-1425 SLP Individual Time Calculation (min): 75 min (15 minutes of make-up time)   Short Term Goals: Week 1: SLP Short Term Goal 1 (Week 1): Patient will utilize external aids to recall new, daily information with Mod A multimodal cues.  SLP Short Term Goal 1 - Progress (Week 1): Discontinued (comment) SLP Short Term Goal 2 (Week 1): Patient will demonstrate functional problem solving for basic tasks with Mod A multimodal cues.  SLP Short Term Goal 2 - Progress (Week 1): Discontinued (comment) SLP Short Term Goal 3 (Week 1): Patient will self-monitor and correct errors with functional tasks Mod A question cues.  SLP Short Term Goal 3 - Progress (Week 1): Discontinued (comment) SLP Short Term Goal 4 (Week 1): Patient will consume current diet with minimal overt s/s of aspiration with Mod I for use of swallowing compensatory strategies. SLP Short Term Goal 5 (Week 1): Patient will utilize an increased vocal intensity at the sentence level with Mod I to achieve 100% intelligibility.   Skilled Therapeutic Interventions: Skilled treatment session focused on speech and dysphagia goals. SLP facilitated session by providing total A for recall of speech intelligibility strategies. However, patient utilized strategies during a structured task at the sentence level with supervision verbal cues to achieve ~90% intelligibility. Patient consumed thin liquids via cup without overt s/s of aspiration and was Mod I for use of swallowing compensatory strategies. Recommend patient's plan of care change to 3X/week to address higher-level speech and swallowing goals. Both the patient and her husband verbalized understanding and agreement. Patient left supine in bed with all needs within reach. Continue with current plan of care.    Function:  Eating Eating   Modified Consistency Diet: Yes Eating Assist Level: Supervision or verbal cues   Eating Set Up Assist For: Opening containers;Cutting food       Cognition Comprehension Comprehension assist level: Follows basic conversation/direction with no assist  Expression   Expression assist level: Expresses basic 90% of the time/requires cueing < 10% of the time.  Social Interaction Social Interaction assist level: Interacts appropriately 75 - 89% of the time - Needs redirection for appropriate language or to initiate interaction.  Problem Solving Problem solving assist level: Solves basic 75 - 89% of the time/requires cueing 10 - 24% of the time  Memory Memory assist level: Requires cues to use assistive device;Recognizes or recalls 75 - 89% of the time/requires cueing 10 - 24% of the time    Pain Pain Assessment Pain Assessment: No/denies pain  Therapy/Group: Individual Therapy  Marcelia Petersen 05/15/2016, 4:00 PM

## 2016-05-16 ENCOUNTER — Inpatient Hospital Stay (HOSPITAL_COMMUNITY): Payer: Medicare Other | Admitting: Physical Therapy

## 2016-05-16 ENCOUNTER — Inpatient Hospital Stay (HOSPITAL_COMMUNITY): Payer: Medicare Other | Admitting: Speech Pathology

## 2016-05-16 ENCOUNTER — Inpatient Hospital Stay (HOSPITAL_COMMUNITY): Payer: Medicare Other | Admitting: Occupational Therapy

## 2016-05-16 LAB — GLUCOSE, CAPILLARY
Glucose-Capillary: 211 mg/dL — ABNORMAL HIGH (ref 65–99)
Glucose-Capillary: 314 mg/dL — ABNORMAL HIGH (ref 65–99)
Glucose-Capillary: 101 mg/dL — ABNORMAL HIGH (ref 65–99)
Glucose-Capillary: 245 mg/dL — ABNORMAL HIGH (ref 65–99)

## 2016-05-16 MED ORDER — INSULIN ASPART 100 UNIT/ML ~~LOC~~ SOLN
5.0000 [IU] | Freq: Two times a day (BID) | SUBCUTANEOUS | Status: DC
  Administered 2016-05-17 – 2016-05-22 (×12): 5 [IU] via SUBCUTANEOUS

## 2016-05-16 NOTE — Progress Notes (Signed)
[place] PHYSICAL MEDICINE & REHABILITATION     PROGRESS NOTE    Subjective/Complaints: Restless last night. Typically develops anxiety in the evening.   ROS: pt denies nausea, vomiting, diarrhea, cough, shortness of breath or chest pain   Objective: Vital Signs: Blood pressure (!) 155/74, pulse 92, temperature 98.2 F (36.8 C), temperature source Oral, resp. rate 18, SpO2 94 %. No results found. No results for input(s): WBC, HGB, HCT, PLT in the last 72 hours.  Recent Labs  05/15/16 0544  NA 137  K 3.5  CL 104  GLUCOSE 253*  BUN 10  CREATININE 0.81  CALCIUM 8.7*   CBG (last 3)   Recent Labs  05/15/16 1638 05/15/16 2131 05/16/16 0634  GLUCAP 166* 229* 211*    Wt Readings from Last 3 Encounters:  05/23/14 72.6 kg (160 lb)  05/09/12 82.1 kg (181 lb)  04/15/12 87.7 kg (193 lb 4.8 oz)    Physical Exam:  Constitutional: flat.  HENT:  Head: Normocephalic and atraumatic.  Mouth/Throat: Oropharynx is pink/moist.  Eyes: PERRL.  Neck: Normal range of motion. Neck supple.  Cardiovascular: RRR.   No murmur heard. Respiratory: clear GI: Soft. Bowel sounds are normal. She exhibits no distension. There is no tenderness.  Musculoskeletal: She exhibits no edema or tenderness.  Neurological: She is alert and oriented to person, place, and time.  Left facial weakness/central 7. LUQ anopsia remains  Oriented to self/place Left inattention..  Motor: RUE/RLE: 4+/5 proximal to distal LUE/LLE: 3 to 4-/5 proximal to distal with inconsistent activation Emerging tone in biceps, shoulder 1/4 Sensation intact to light touch   Skin: Skin is warm and dry. No sacral breakdown Psychiatric: Her affect is flat Her speech is delayed. She is slowed. She exhibits abnormal recent memory and abnormal remote memory.   Assessment/Plan: 1. Functional, cognitive, and mobility deficits secondary to right MCA infarct which require 3+ hours per day of interdisciplinary therapy in a  comprehensive inpatient rehab setting. Physiatrist is providing close team supervision and 24 hour management of active medical problems listed below. Physiatrist and rehab team continue to assess barriers to discharge/monitor patient progress toward functional and medical goals.  Function:  Bathing Bathing position   Position: Shower  Bathing parts Body parts bathed by patient: Right arm, Chest, Abdomen, Front perineal area, Right upper leg, Left upper leg, Left arm, Right lower leg, Left lower leg Body parts bathed by helper: Back, Buttocks  Bathing assist Assist Level: Touching or steadying assistance(Pt > 75%)      Upper Body Dressing/Undressing Upper body dressing   What is the patient wearing?: Pull over shirt/dress     Pull over shirt/dress - Perfomed by patient: Thread/unthread right sleeve, Put head through opening, Thread/unthread left sleeve Pull over shirt/dress - Perfomed by helper: Pull shirt over trunk        Upper body assist Assist Level: Touching or steadying assistance(Pt > 75%)      Lower Body Dressing/Undressing Lower body dressing   What is the patient wearing?: Pants, Non-skid slipper socks     Pants- Performed by patient: (P) Thread/unthread right pants leg Pants- Performed by helper: (P) Thread/unthread left pants leg, Pull pants up/down Non-skid slipper socks- Performed by patient: Don/doff right sock, Don/doff left sock Non-skid slipper socks- Performed by helper: (P) Don/doff right sock, Don/doff left sock                  Lower body assist Assist for lower body dressing: Touching or steadying assistance (Pt >  75%)      Toileting Toileting   Toileting steps completed by patient: Performs perineal hygiene Toileting steps completed by helper: Adjust clothing prior to toileting, Adjust clothing after toileting Toileting Assistive Devices: Grab bar or rail  Toileting assist Assist level: Touching or steadying assistance (Pt.75%)    Transfers Chair/bed transfer   Chair/bed transfer method: Stand pivot Chair/bed transfer assist level: Touching or steadying assistance (Pt > 75%) Chair/bed transfer assistive device: Armrests     Locomotion Ambulation     Max distance: 150 ft Assist level: Touching or steadying assistance (Pt > 75%)   Wheelchair   Type: Manual Max wheelchair distance: 150 ft Assist Level: Moderate assistance (Pt 50 - 74%)  Cognition Comprehension Comprehension assist level: Follows basic conversation/direction with no assist  Expression Expression assist level: Expresses basic needs/ideas: With no assist  Social Interaction Social Interaction assist level: Interacts appropriately 90% of the time - Needs monitoring or encouragement for participation or interaction.  Problem Solving Problem solving assist level: Solves basic problems with no assist  Memory Memory assist level: Recognizes or recalls 90% of the time/requires cueing < 10% of the time   Medical Problem List and Plan: 1.  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  -continue CIR, PT, OT, SLP   -team conference 2.  DVT Prophylaxis/Anticoagulation: Pharmaceutical: Lovenox 3. Pain Management: Back/sacral pain.    - Kpad and  balm 4. Mood: LCSW to follow for evaluation and support.  5. Neuropsych: This patient is not fully capable of making decisions on her own behalf. 6. Skin/Wound Care: routine pressure relief measures.  7. Fluids/Electrolytes/Nutrition: Monitor I/O. I personally reviewed the patient's labs today.   -BUN improved yesterday  -will hold IVF  -recheck bmet on Thursday 8. HTN:  Lisinopril on hold with control at present  9. T2DM:  Had been on Lantus 18 units daily with SSI for elevated BS- .  .   -changed lantus to nph insulin 12u bid---still poorly controlled. Increased to 16u bid yesterday---trending toward improvement---obsv today  -continue tradjenta  CBG (last  3)   Recent Labs  05/15/16 1638 05/15/16 2131 05/16/16 0634  GLUCAP 166* 229* 211*    10 Anxiety/depression: team to provide ego support. She was on Abilify, Klonopin and Wellbutrin at home. Klonopin resumed.  resumed low dose Wellbutrin..   -continue prn xanax for breakthrough anxiety   11. Dyslipidemia: On Lipitor.    12. Nausea:  Will resume Prilosec. Anxiety may be a component.    -ua equivocal--ucx with multispecies  -wbc's down to 9.7 most recently   LOS (Days) 6 A FACE TO FACE EVALUATION WAS PERFORMED  Mamoudou Mulvehill T 05/16/2016 8:39 AM

## 2016-05-16 NOTE — Patient Care Conference (Signed)
Inpatient RehabilitationTeam Conference and Plan of Care Update Date: 05/16/2016   Time: 2:10 pm    [facility]      Medical Record Number: [id number]  Date of Birth: 06-Dec-1955 Sex: Female         Room/Bed: 4W01C/4W01C-01 Payor Info: Payor: MEDICARE / Plan: MEDICARE PART A AND B / Product Type: *No Product type* /    Admitting Diagnosis: CVA  Admit Date/Time:  05/10/2016  3:59 PM Admission Comments: No comment available   Primary Diagnosis:  <principal problem not specified> Principal Problem: <principal problem not specified>  Patient Active Problem List   Diagnosis Date Noted  . Acute ischemic right MCA stroke (HCC) 05/10/2016  . Cognitive deficit, post-stroke   . Adjustment disorder with mixed anxiety and depressed mood   . Nausea without vomiting   . Hemiparesis of right nondominant side due to cerebral infarction (HCC)   . Dysarthria, post-stroke   . Hemiparesis of left nondominant side due to cerebral infarction (HCC)   . History of CVA with residual deficit   . Benign essential HTN   . Non-rheumatic mitral regurgitation   . Tobacco abuse   . Leukocytosis   . Dysphagia, post-stroke   . Depression 05/07/2016  . Generalized anxiety disorder 05/07/2016  . Ankle fracture, left 04/14/2012  . Fall due to stumbling 04/14/2012  . Hypoglycemia 04/14/2012  . Hypotension 04/14/2012  . Diabetes mellitus (HCC) 04/14/2012  . Hyperlipidemia 04/14/2012  . History of CVA (cerebrovascular accident) 04/14/2012    Expected Discharge Date: Expected Discharge Date: 05/27/16  Team Members Present: Physician leading conference: Dr. Faith RogueZachary Swartz Social Worker Present: Amada JupiterLucy Ellise Kovack, LCSW Nurse Present: Carmie EndAngie Joyce, RN PT Present: Alyson ReedyElizabeth Tygielski, PT OT Present: Johnsie CancelAmy Lewis, OT SLP Present: Fae PippinMelissa Bowie, SLP PPS Coordinator present : Tora DuckMarie Noel, RN, CRRN     Current Status/Progress Goal Weekly Team Focus  Medical   cva with left hemiparesis and inattention,  anxieyt issues,   improve visual=spatial awareness  anxiety, nutrition, safety   Bowel/Bladder   Continent of bowel and bladder. LBM 05/14/16  Pt to remain continent of bowel and bladder  Assist to bathroom or BSC at night   Swallow/Nutrition/ Hydration   Regular textures with thin liquids, full supervision for use of strategies   Mod I  Increase use of swallow strategies    ADL's   Mod-max A LB dressing; Mod A toileting; Mod- max A standing balance; Mod A stand pivot transfers  Suprevision overall  ADL re-training, standing balance, activity tolerance, Neuro re-ed   Mobility   S bed mobility, minA transfers, gait with RW  S overall, minA stairs  BLE NMR, gait training, transfer training, activity tolerance   Communication   Supervision  Mod I   Increased use of speech intelligibility strategies    Safety/Cognition/ Behavioral Observations  At baseline          Pain   No c/o pain  <3  Monitor for nonverbal cues of pain   Skin   CDI-Intermitent bruising to abdomen  CDI  Assess q shift    Rehab Goals Patient on target to meet rehab goals: Yes *See Care Plan and progress notes for long and short-term goals.  Barriers to Discharge: safety awareness, left visual-spatial deficits    Possible Resolutions to Barriers:  continued v-s training, safety training, family ed, supervision at home    Discharge Planning/Teaching Needs:  Plan to return home with spouse who can provide 24/7 assistance.  To be done  closer to d/c   Team Discussion:  Poor sleep; anxiety; adjusting DM meds.  Min - mod assist overall with PT/OT and needs cues to use left hand.  ST d/c'd cogn goals as husband reports she is at baseline.  Still a full supervision for po but on reg diet with thin.  Setting supervision goals but may require some downgrading by next team conference.  Revisions to Treatment Plan:  None   Continued Need for Acute Rehabilitation Level of Care: The patient requires daily medical management  by a physician with specialized training in physical medicine and rehabilitation for the following conditions: Daily direction of a multidisciplinary physical rehabilitation program to ensure safe treatment while eliciting the highest outcome that is of practical value to the patient.: Yes Daily medical management of patient stability for increased activity during participation in an intensive rehabilitation regime.: Yes Daily analysis of laboratory values and/or radiology reports with any subsequent need for medication adjustment of medical intervention for : Mood/behavior problems  Chas Axel 05/18/2016, 6:26 AM

## 2016-05-16 NOTE — Progress Notes (Signed)
Social Work Patient ID: Chloe Owens, female   DOB: 1955-07-17, 60 y.o.   MRN: [id number]   Met with pt and spouse this afternoon to review team conference.  Pt in bed and yawning throughout discussion and makes no comments to information.  Both aware and agreeable with targeted d/c date of 12/23 with supervision goals.  They also understand that team may need to downgrade some goals to min assist dependent on gains made this week.  Husband confirms he is able to provide the 24/7 assistance and is here daily.  Will continue to follow.  Jasan Doughtie, LCSW

## 2016-05-16 NOTE — Progress Notes (Signed)
Occupational Therapy Session Note  Patient Details  Name: Chloe Owens MRN: [id number] Date of Birth: 1955/06/09  Today's Date: 05/16/2016 OT Individual Time: 0700-0800 OT Individual Time Calculation (min): 60 min     Short Term Goals:Week 1:  OT Short Term Goal 1 (Week 1): Pt will demonstrate hemiplegic techniques for UB dressing with min A. OT Short Term Goal 2 (Week 1): Pt will demonstrate hemiplegic techniques for LB dressing with mod A in order to increase I in self care. OT Short Term Goal 3 (Week 1): Pt will perform toilet transfer with mod A to decrease level of assist with functional transfer. OT Short Term Goal 4 (Week 1): Pt will maintain standing balance with mod A during LB clothing management.   Skilled Therapeutic Interventions/Progress Updates:    Pt seen for OT ADL bathing/dressing session. Pt in supine upon arrival, voicing having not slept well but agreeable to tx session and bathing at shower level.  Throughout session, stand pivots completed with mod A and max VCs for sequencing of steps and attention to advance L LE.  She bathed seated on tub bench, Pt intermittently using L LE spontaneously, while other times requiring min VCs to initiate into functional task.  She dressed seated in w/c at sink. No carry over of hemi dressing technique taught in yesterday's session and required constant re-education of technique for each article of clothing donned. Pt with improved static standing balance today vs. Previous session. Close supervision for static standing balance at RW, however, with L lean during dynamic standing tasks. She required constant cuing throughout session for hand placement on RW/ w/c during sit <> stands demonstrating no carryover of education.  Toileting task completed x2 throughout session, pt able to complete hygiene, requiring assist for clothing management due to decreased standing balance and impaired grasp in L UE. Pt left sitting in w/c at end of  session, QRB donned and NT present to assist with breakfast. Pt educated throughout session regarding hemi dressing techniques, mobility techniques, fall risk, and d/c planning.   Therapy Documentation Precautions:  Precautions Precautions: Fall Restrictions Weight Bearing Restrictions: No Pain:   No/ denies pain  See Function Navigator for Current Functional Status.   Therapy/Group: Individual Therapy  Lewis, Doreena Maulden C 05/16/2016, 6:38 AM

## 2016-05-16 NOTE — Progress Notes (Signed)
Speech Language Pathology Daily Session Note  Patient Details  Name: Chloe Owens MRN: [id number] Date of Birth: 12-06-1955  Today's Date: 05/16/2016 SLP Individual Time: 1500-1555 SLP Individual Time Calculation (min): 55 min   Short Term Goals: Week 1: SLP Short Term Goal 1 (Week 1): Patient will utilize external aids to recall new, daily information with Mod A multimodal cues.  SLP Short Term Goal 1 - Progress (Week 1): Discontinued (comment) SLP Short Term Goal 2 (Week 1): Patient will demonstrate functional problem solving for basic tasks with Mod A multimodal cues.  SLP Short Term Goal 2 - Progress (Week 1): Discontinued (comment) SLP Short Term Goal 3 (Week 1): Patient will self-monitor and correct errors with functional tasks Mod A question cues.  SLP Short Term Goal 3 - Progress (Week 1): Discontinued (comment) SLP Short Term Goal 4 (Week 1): Patient will consume current diet with minimal overt s/s of aspiration with Mod I for use of swallowing compensatory strategies. SLP Short Term Goal 5 (Week 1): Patient will utilize an increased vocal intensity at the sentence level with Mod I to achieve 100% intelligibility.   Skilled Therapeutic Interventions:   Skilled treatment session focused on addressing dysphagia and dysarthria goals. SLP facilitated session by providing set-up of regular textures and thin liquids via cup, which patient consumed with Mod I use of safe swallow strategies for oral clearance and no overt s/s of aspiration.  SLP also facilitated session with a structured paragraph level, reading task that patient completed with Supervision level question cues to recall and utilize over articulation and loud speech.  Patient required increased cues, Min assist to carryover and utilize in less structured phrase-sentence level verbal expression exchanges.  Continue with current plan of care.    Function:  Eating Eating   Modified Consistency Diet: No Eating Assist Level:  Set up assist for   Eating Set Up Assist For: Opening containers       Cognition Comprehension Comprehension assist level: Follows basic conversation/direction with no assist  Expression   Expression assist level: Expresses basic 90% of the time/requires cueing < 10% of the time.  Social Interaction Social Interaction assist level: Interacts appropriately 75 - 89% of the time - Needs redirection for appropriate language or to initiate interaction.  Problem Solving Problem solving assist level: Solves basic 75 - 89% of the time/requires cueing 10 - 24% of the time  Memory Memory assist level: Requires cues to use assistive device;Recognizes or recalls 75 - 89% of the time/requires cueing 10 - 24% of the time    Pain Pain Assessment Pain Assessment: No/denies pain  Therapy/Group: Individual Therapy  Chloe Owens, M.A., Chloe Owens [phone]  Chloe Owens 05/16/2016, 4:13 PM

## 2016-05-16 NOTE — Progress Notes (Signed)
Occupational Therapy Session Note  Patient Details  Name: Chloe Owens J Luka MRN: [id number] Date of Birth: 1955/10/22  Today's Date: 05/16/2016 OT Individual Time: 1135-1200 OT Individual Time Calculation (min): 25 min     Short Term Goals: Week 1:  OT Short Term Goal 1 (Week 1): Pt will demonstrate hemiplegic techniques for UB dressing with min A. OT Short Term Goal 2 (Week 1): Pt will demonstrate hemiplegic techniques for LB dressing with mod A in order to increase I in self care. OT Short Term Goal 3 (Week 1): Pt will perform toilet transfer with mod A to decrease level of assist with functional transfer. OT Short Term Goal 4 (Week 1): Pt will maintain standing balance with mod A during LB clothing management.   Skilled Therapeutic Interventions/Progress Updates:    Treatment session with focus on functional transfers and LUE NMR.  Pt received in bed asleep but easily awakened and willing to participate in treatment session.  Engaged in LUE NMR in sitting at EOB with focus on reaching into various planes and thumb opposition to pick up small items and reach across midline to place into container. Pt with difficulty picking up items, however in isolation demonstrates thumb opposition to first and second digit.  Pt reported need to toilet.  Completed stand pivot transfer bed > BSC with min assist.  Pt completing clothing management at hygiene with min assist for standing balance.  Transferred BSC to w/c and left upright in w/c awaiting lunch.  Therapy Documentation Precautions:  Precautions Precautions: Fall Restrictions Weight Bearing Restrictions: No Pain:  Pt with no c/o pain  See Function Navigator for Current Functional Status.   Therapy/Group: Individual Therapy  Rosalio LoudHOXIE, Denzel Etienne 05/16/2016, 3:11 PM

## 2016-05-16 NOTE — Progress Notes (Signed)
Physical Therapy Session Note  Patient Details  Name: Chloe Owens J Miotke MRN: [id number] Date of Birth: 27-Aug-1955  Today's Date: 05/16/2016 PT Individual Time: 0900-1000 PT Individual Time Calculation (min): 60 min    Short Term Goals: Week 1:  PT Short Term Goal 1 (Week 1): Pt will ambulate 50 ft with min A x 1. PT Short Term Goal 2 (Week 1): Pt will perform sit to stand and mat to chair transfer with min A x 1 PT Short Term Goal 3 (Week 1): Pt will perform supine to sit and sit to supine wiht min A PT Short Term Goal 4 (Week 1): Pt will ascend and descend 2 stairs with mod A.  Skilled Therapeutic Interventions/Progress Updates:   Pt received seated in w/c, c/o pain 8/10 in low back and agreeable to treatment with encouragement. Pt fatigues quickly during session and requires encouragement to participate. Stand pivot transfer w/c <>bed with RW and min/modA for LLE progression and weight shifting to R. Adjusted w/c leg rests for improved positioning and pressure distribution. Gait x50' with RW and minA with ace wrap on LLE, cues at R hip during stance phase for weight shift to R and cues at L hip flexors during swing for progression and increased step length. Standing weight shifting with pt reaching for horseshoes on R side with RUE. Seated connect 4 game, initially encouraging pt to use LUE to retrieve game pieces with hand-over-hand assist however poor fine motor control and pt easily frustrated; completed game with RUE for sustained attention and problem solving. Gait x75' with minA for weight shifting and LLE progression. Sit >supine with S and increased time, cues for technique. Upon reaching supine position, pt reports she needs to use restroom. Supine>sit with S. Stand pivot transfer bed <>BSC with min/modA, and maxA for toileting to don/doff pants in standing. Returned to bed, sit>supine with S. Remained supine in bed at end of session, alarm intact and all needs in reach. RN alerted to pt  request for medication for indigestion.   Therapy Documentation Precautions:  Precautions Precautions: Fall Restrictions Weight Bearing Restrictions: No   See Function Navigator for Current Functional Status.   Therapy/Group: Individual Therapy  Vista Lawmanlizabeth J Tygielski 05/16/2016, 10:03 AM

## 2016-05-17 ENCOUNTER — Inpatient Hospital Stay (HOSPITAL_COMMUNITY): Payer: Medicare Other | Admitting: Physical Therapy

## 2016-05-17 ENCOUNTER — Inpatient Hospital Stay (HOSPITAL_COMMUNITY): Payer: Medicare Other | Admitting: Occupational Therapy

## 2016-05-17 LAB — BASIC METABOLIC PANEL
Anion gap: 10 (ref 5–15)
BUN: 12 mg/dL (ref 6–20)
CO2: 25 mmol/L (ref 22–32)
Calcium: 8.9 mg/dL (ref 8.9–10.3)
Chloride: 107 mmol/L (ref 101–111)
Creatinine, Ser: 0.86 mg/dL (ref 0.44–1.00)
GFR calc Af Amer: >60 mL/min (ref >60)
GFR calc non Af Amer: >60 mL/min (ref >60)
Glucose, Bld: 84 mg/dL (ref 65–99)
Potassium: 3.3 mmol/L — ABNORMAL LOW (ref 3.5–5.1)
Sodium: 142 mmol/L (ref 135–145)

## 2016-05-17 LAB — CBC
HCT: 36.9 % (ref 36.0–46.0)
Hemoglobin: 12.8 g/dL (ref 12.0–15.0)
MCH: 29.6 pg (ref 26.0–34.0)
MCHC: 34.7 g/dL (ref 30.0–36.0)
MCV: 85.4 fL (ref 78.0–100.0)
Platelets: 252 10*3/uL (ref 150–400)
RBC: 4.32 MIL/uL (ref 3.87–5.11)
RDW: 12.7 % (ref 11.5–15.5)
WBC: 8.8 10*3/uL (ref 4.0–10.5)

## 2016-05-17 LAB — GLUCOSE, CAPILLARY
Glucose-Capillary: 105 mg/dL — ABNORMAL HIGH (ref 65–99)
Glucose-Capillary: 129 mg/dL — ABNORMAL HIGH (ref 65–99)
Glucose-Capillary: 95 mg/dL (ref 65–99)
Glucose-Capillary: 241 mg/dL — ABNORMAL HIGH (ref 65–99)

## 2016-05-17 MED ORDER — POTASSIUM CHLORIDE CRYS ER 20 MEQ PO TBCR
20.0000 meq | EXTENDED_RELEASE_TABLET | Freq: Every day | ORAL | Status: AC
  Administered 2016-05-17 – 2016-05-19 (×3): 20 meq via ORAL
  Filled 2016-05-17 (×3): qty 1

## 2016-05-17 NOTE — Progress Notes (Addendum)
PHYSICAL MEDICINE & REHABILITATION     PROGRESS NOTE    Subjective/Complaints: States she slept better last night. No back pain.   ROS: pt denies nausea, vomiting, diarrhea, cough, shortness of breath or chest pain   Objective: Vital Signs: Blood pressure (!) 138/45, pulse 74, temperature 97.6 F (36.4 C), temperature source Oral, resp. rate 16, weight 77.2 kg (170 lb 3.1 oz), SpO2 96 %. No results found.  Recent Labs  05/17/16 0649  WBC 8.8  HGB 12.8  HCT 36.9  PLT 252    Recent Labs  05/15/16 0544 05/17/16 0649  NA 137 142  K 3.5 3.3*  CL 104 107  GLUCOSE 253* 84  BUN 10 12  CREATININE 0.81 0.86  CALCIUM 8.7* 8.9   CBG (last 3)   Recent Labs  05/16/16 1646 05/16/16 2041 05/17/16 0618  GLUCAP 101* 245* 105*    Wt Readings from Last 3 Encounters:  05/17/16 77.2 kg (170 lb 3.1 oz)  05/23/14 72.6 kg (160 lb)  05/09/12 82.1 kg (181 lb)    Physical Exam:  Constitutional: flat.  HENT:  Head: Normocephalic and atraumatic.  Mouth/Throat: Oropharynx is pink/moist.  Eyes: PERRL.  Neck: Normal range of motion. Neck supple.  Cardiovascular: RRR   No murmur heard. Respiratory: clear GI: Soft. Bowel sounds are normal. She exhibits no distension. There is no tenderness.  Musculoskeletal: She exhibits no edema or tenderness.  Neurological: She is alert and oriented to person, place, and time.  Left facial weakness/central 7. LUQ anopsia remains  Oriented to self/place/reason she's here.  Left inattention persists. Engages more Motor: RUE/RLE: 4+/5 proximal to distal LUE/LLE: 3+ to 4/5 proximal to distal with inconsistent activation Emerging tone in biceps, shoulder 1/4 Sensation intact to light touch   Skin: Skin is warm and dry. No sacral breakdown Psychiatric: Her affect is flat Her speech is delayed.     Assessment/Plan: 1. Functional, cognitive, and mobility deficits secondary to right MCA infarct which require 3+ hours per day of  interdisciplinary therapy in a comprehensive inpatient rehab setting. Physiatrist is providing close team supervision and 24 hour management of active medical problems listed below. Physiatrist and rehab team continue to assess barriers to discharge/monitor patient progress toward functional and medical goals.  Function:  Bathing Bathing position   Position: Sitting EOB  Bathing parts Body parts bathed by patient: Right arm, Chest, Abdomen, Right upper leg, Left upper leg, Left arm, Right lower leg, Left lower leg, Front perineal area Body parts bathed by helper: Buttocks, Back  Bathing assist Assist Level: Touching or steadying assistance(Pt > 75%)      Upper Body Dressing/Undressing Upper body dressing   What is the patient wearing?: Pull over shirt/dress     Pull over shirt/dress - Perfomed by patient: Thread/unthread right sleeve, Put head through opening, Thread/unthread left sleeve, Pull shirt over trunk Pull over shirt/dress - Perfomed by helper: Pull shirt over trunk        Upper body assist Assist Level: Touching or steadying assistance(Pt > 75%)      Lower Body Dressing/Undressing Lower body dressing   What is the patient wearing?: Pants     Pants- Performed by patient: Thread/unthread right pants leg, Thread/unthread left pants leg Pants- Performed by helper: Pull pants up/down Non-skid slipper socks- Performed by patient: Don/doff right sock, Don/doff left sock Non-skid slipper socks- Performed by helper: Don/doff right sock, Don/doff left sock  Lower body assist Assist for lower body dressing: Touching or steadying assistance (Pt > 75%)      Toileting Toileting   Toileting steps completed by patient: Performs perineal hygiene Toileting steps completed by helper: Adjust clothing prior to toileting, Adjust clothing after toileting Toileting Assistive Devices: Grab bar or rail  Toileting assist Assist level: Touching or steadying  assistance (Pt.75%)   Transfers Chair/bed transfer   Chair/bed transfer method: Stand pivot Chair/bed transfer assist level: Touching or steadying assistance (Pt > 75%) Chair/bed transfer assistive device: Walker, Designer, fashion/clothingArmrests     Locomotion Ambulation     Max distance: 75 Assist level: Touching or steadying assistance (Pt > 75%)   Wheelchair   Type: Manual Max wheelchair distance: 150 ft Assist Level: Moderate assistance (Pt 50 - 74%)  Cognition Comprehension Comprehension assist level: Follows basic conversation/direction with no assist  Expression Expression assist level: Expresses basic needs/ideas: With no assist  Social Interaction Social Interaction assist level: Interacts appropriately 90% of the time - Needs monitoring or encouragement for participation or interaction.  Problem Solving Problem solving assist level: Solves basic problems with no assist  Memory Memory assist level: Recognizes or recalls 90% of the time/requires cueing < 10% of the time   Medical Problem List and Plan: 1.  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  -continue CIR, PT, OT, SLP   -ELOS 12/23 2.  DVT Prophylaxis/Anticoagulation: Pharmaceutical: Lovenox 3. Pain Management: Back/sacral pain.    - Kpad and  balm 4. Mood: LCSW to follow for evaluation and support.  5. Neuropsych: This patient is not fully capable of making decisions on her own behalf. 6. Skin/Wound Care: routine pressure relief measures.  7. Fluids/Electrolytes/Nutrition: Monitor I/O. I personally reviewed the patient's labs today.   -BUN improved   -will hold IVF  -labs reviewed today 8. HTN:  Lisinopril on hold with control at present  9. T2DM:  Had been on Lantus 18 units daily with SSI for elevated BS- .  .   -nph increased to 16u  -novolog added with breakfast and lunch yesterday---  -continue tradjenta   -observe for pattern today CBG (last 3)   Recent Labs   05/16/16 1646 05/16/16 2041 05/17/16 0618  GLUCAP 101* 245* 105*    10 Anxiety/depression: team to provide ego support. She was on Abilify, Klonopin and Wellbutrin at home. Klonopin resumed.  resumed low dose Wellbutrin..   -continue prn xanax for breakthrough anxiety   11. Dyslipidemia: On Lipitor.    12. Nausea:  Will resume Prilosec. Anxiety may be a component.    -ua equivocal--ucx with multispecies  -wbc's down to 9.7 most recently   LOS (Days) 7 A FACE TO FACE EVALUATION WAS PERFORMED  Keyleigh Manninen T 05/17/2016 8:44 AM

## 2016-05-17 NOTE — Progress Notes (Signed)
Occupational Therapy Session Note  Patient Details  Name: Chloe Owens J Bezdek MRN: [id number] Date of Birth: 22-Jun-1955  Today's Date: 05/17/2016 OT Individual Time: 1027-25360730-0830 OT Individual Time Calculation (min): 60 min     Short Term Goals:Week 1:  OT Short Term Goal 1 (Week 1): Pt will demonstrate hemiplegic techniques for UB dressing with min A. OT Short Term Goal 2 (Week 1): Pt will demonstrate hemiplegic techniques for LB dressing with mod A in order to increase I in self care. OT Short Term Goal 3 (Week 1): Pt will perform toilet transfer with mod A to decrease level of assist with functional transfer. OT Short Term Goal 4 (Week 1): Pt will maintain standing balance with mod A during LB clothing management.   Skilled Therapeutic Interventions/Progress Updates:    Pt seen for OT ADL bathing/dressing session. Pt in supine upon arrival, voicing having had a better night's sleep and agreeable to tx session. She completed bathing/dressing seated EOB, supervision for dynamic sitting balance. Hand over hand assist provided for use of L UE to reach to wash opposite shoulder/ arm.  She dressed requiring verbal and tactile cues for hemi dressing technique and sequencing.  [facility] and min-mod A and VCs for L foot attention. Multiple sit <> stands required to complete 3/3 toileting tasks due to decreased standing tolerance/ attention. She ate breakfast seated EOB, hand over hand assist provided to use L UE at gross stabilizer level. VCs provided for adherence to swallowing precautions.  AAROM for neuro re-ed and practicing grasping/ releasing of cup completed with L UE. Verbal and tactile cuing provided. Pt requested return to supine at end of session, left with all needs in reach and bed alarm on.   Therapy Documentation Precautions:  Precautions Precautions: Fall Restrictions Weight Bearing Restrictions: No  See Function Navigator for Current Functional  Status.   Therapy/Group: Individual Therapy  Lewis, Williard Keller C 05/17/2016, 7:04 AM

## 2016-05-17 NOTE — Progress Notes (Signed)
Physical Therapy Session Note  Patient Details  Name: Chloe Owens J Buswell MRN: [id number] Date of Birth: 02-24-1956  Today's Date: 05/17/2016 PT Individual Time: 1100-1200 and 1300 and 1400 PT Individual Time Calculation (min): 60 min and 60 min (total 120 min)   Short Term Goals: Week 1:  PT Short Term Goal 1 (Week 1): Pt will ambulate 50 ft with min A x 1. PT Short Term Goal 2 (Week 1): Pt will perform sit to stand and mat to chair transfer with min A x 1 PT Short Term Goal 3 (Week 1): Pt will perform supine to sit and sit to supine wiht min A PT Short Term Goal 4 (Week 1): Pt will ascend and descend 2 stairs with mod A.  Skilled Therapeutic Interventions/Progress Updates:   Tx 1: Pt received supine in bed, denies pain and agreeable to treatment. Bed mobility with S. Stand pivot transfer w/c <>mat table with minA x4 during session. Standing balance with LLE toe taps for R weight shifting and LLE coordination. Gait x75' and x30' with L HHA and facilitation for R weight shifting and cues for LLE progression and step length. Pt fatigues quickly on second trial and declines additional attempts at ambulation. Stand pivot transfer w/c <>nustep with minA. Performed nustep x10 min with BUE/BLE for strengthening and endurance. Pt's husband present for end of session; discussed pt's progress, balance impairments, and current barriers. Pt ambulated back to room x75' with HHA before fatigued. Remained seated in w/c at end of session, all needs in reach.   Tx 2: Pt received seated in w/c, c/o mid/low back pain but otherwise agreeable to treatment. Transported to gym totalA for energy conservation. Stairs x4 with B handrails and min guard at 6" height with min cues for gait pattern to lead with stronger RLE. Lateral step ups x10 reps LLE with BUE support for LLE coordination and strengthening with min guard. Stand pivot transfer w/c <>mat table x4 during session with min guard. Standing balance on foam wedge for  gastroc/soleus stretch and anterior tibialis facilitation for ankle strategy with posterior LOB. While resting in sitting, pt performed LUE reaching to retrieve checkers at various heights/distances for LUE coordination and motor planning, then placed game pieces in connect 4 grid with minA. Pt easily frustrated, reports increased back pain. PROM to thoracic musculature with upright sitting posture for decreasing excessive thoracic kyphosis and stretching pecs. Perfomed x15 reps scapular retraction for posterior chain activation and postural reeducation. Gait 2x75' with L HHA, LLE ace wrap dorsiflexion assist, and facilitation for R weight shift reduced with repetition of activity. Remained seated in recliner at end of session, husband present and instructed to alert nursing for transfer back to bed, and plan to train husband in bed <>chair transfers tomorrow. All needs in reach.   Therapy Documentation Precautions:  Precautions Precautions: Fall Restrictions Weight Bearing Restrictions: No General: PT Amount of Missed Time (min): 15 Minutes PT Missed Treatment Reason: Patient fatigue   See Function Navigator for Current Functional Status.   Therapy/Group: Individual Therapy  Vista Lawmanlizabeth J Tygielski 05/17/2016, 12:56 PM

## 2016-05-18 ENCOUNTER — Inpatient Hospital Stay (HOSPITAL_COMMUNITY): Payer: Medicare Other | Admitting: Speech Pathology

## 2016-05-18 ENCOUNTER — Inpatient Hospital Stay (HOSPITAL_COMMUNITY): Payer: Medicare Other | Admitting: Physical Therapy

## 2016-05-18 ENCOUNTER — Inpatient Hospital Stay (HOSPITAL_COMMUNITY): Payer: Medicare Other | Admitting: Occupational Therapy

## 2016-05-18 ENCOUNTER — Ambulatory Visit (HOSPITAL_COMMUNITY): Payer: Medicare Other | Admitting: Physical Therapy

## 2016-05-18 LAB — GLUCOSE, CAPILLARY
Glucose-Capillary: 125 mg/dL — ABNORMAL HIGH (ref 65–99)
Glucose-Capillary: 245 mg/dL — ABNORMAL HIGH (ref 65–99)
Glucose-Capillary: 102 mg/dL — ABNORMAL HIGH (ref 65–99)
Glucose-Capillary: 236 mg/dL — ABNORMAL HIGH (ref 65–99)

## 2016-05-18 MED ORDER — PANTOPRAZOLE SODIUM 40 MG PO TBEC
40.0000 mg | DELAYED_RELEASE_TABLET | Freq: Two times a day (BID) | ORAL | Status: DC
  Administered 2016-05-18 – 2016-05-27 (×18): 40 mg via ORAL
  Filled 2016-05-18 (×18): qty 1

## 2016-05-18 MED ORDER — CLONAZEPAM 0.5 MG PO TABS
0.5000 mg | ORAL_TABLET | Freq: Once | ORAL | Status: AC
  Administered 2016-05-18: 0.5 mg via ORAL

## 2016-05-18 MED ORDER — BUPROPION HCL ER (SR) 100 MG PO TB12
100.0000 mg | ORAL_TABLET | Freq: Two times a day (BID) | ORAL | Status: DC
  Administered 2016-05-18 – 2016-05-27 (×18): 100 mg via ORAL
  Filled 2016-05-18 (×19): qty 1

## 2016-05-18 NOTE — Progress Notes (Signed)
Chloe Owens PHYSICAL MEDICINE & REHABILITATION     PROGRESS NOTE    Subjective/Complaints: No new complaints today. Slept ok  ROS: pt denies nausea, vomiting, diarrhea, cough, shortness of breath or chest pain   Objective: Vital Signs: Blood pressure (!) 153/81, pulse 82, temperature 97.6 F (36.4 C), temperature source Oral, resp. rate 18, height 5\' 8"  (1.727 m), weight 77.2 kg (170 lb 3.1 oz), SpO2 98 %. No results found.  Recent Labs  05/17/16 0649  WBC 8.8  HGB 12.8  HCT 36.9  PLT 252    Recent Labs  05/17/16 0649  NA 142  K 3.3*  CL 107  GLUCOSE 84  BUN 12  CREATININE 0.86  CALCIUM 8.9   CBG (last 3)   Recent Labs  05/17/16 1632 05/17/16 2026 05/18/16 0613  GLUCAP 95 241* 125*    Wt Readings from Last 3 Encounters:  05/17/16 77.2 kg (170 lb 3.1 oz)  05/23/14 72.6 kg (160 lb)  05/09/12 82.1 kg (181 lb)    Physical Exam:  Constitutional: flat.  HENT:  Head: Normocephalic and atraumatic.  Mouth/Throat: Oropharynx is pink/moist.  Eyes: PERRL.  Neck: Normal range of motion. Neck supple.  Cardiovascular: RRR   No murmur heard. Respiratory: clear GI: Soft. Bowel sounds are normal. She exhibits no distension. There is no tenderness.  Musculoskeletal: She exhibits no edema or tenderness.  Neurological: She is alert and oriented to person, place, and time.  Left facial weakness/central 7. LUQ anopsia persistent  Oriented to self/place/reason she's here.  Left inattention persists. Engages more Motor: RUE/RLE: 4+/5 proximal to distal LUE/LLE: 3+ to 4/5 proximal to distal with inconsistent activation Emerging tone in biceps, shoulder 1/4 Sensation intact to light touch   Skin: Skin is warm and dry. No sacral breakdown Psychiatric: Her affect is flat Her speech is delayed.     Assessment/Plan: 1. Functional, cognitive, and mobility deficits secondary to right MCA infarct which require 3+ hours per day of interdisciplinary therapy in a  comprehensive inpatient rehab setting. Physiatrist is providing close team supervision and 24 hour management of active medical problems listed below. Physiatrist and rehab team continue to assess barriers to discharge/monitor patient progress toward functional and medical goals.  Function:  Bathing Bathing position   Position: Sitting EOB  Bathing parts Body parts bathed by patient: Right arm, Chest, Abdomen, Right upper leg, Left upper leg, Left arm, Right lower leg, Left lower leg, Front perineal area Body parts bathed by helper: Buttocks, Back  Bathing assist Assist Level: Touching or steadying assistance(Pt > 75%)      Upper Body Dressing/Undressing Upper body dressing   What is the patient wearing?: Pull over shirt/dress     Pull over shirt/dress - Perfomed by patient: Thread/unthread right sleeve, Put head through opening, Thread/unthread left sleeve, Pull shirt over trunk Pull over shirt/dress - Perfomed by helper: Pull shirt over trunk        Upper body assist Assist Level: Touching or steadying assistance(Pt > 75%)      Lower Body Dressing/Undressing Lower body dressing   What is the patient wearing?: Pants     Pants- Performed by patient: Thread/unthread right pants leg, Thread/unthread left pants leg Pants- Performed by helper: Pull pants up/down Non-skid slipper socks- Performed by patient: Don/doff right sock, Don/doff left sock Non-skid slipper socks- Performed by helper: Don/doff right sock, Don/doff left sock                  Lower body assist Assist  for lower body dressing: Touching or steadying assistance (Pt > 75%)      Toileting Toileting   Toileting steps completed by patient: Performs perineal hygiene Toileting steps completed by helper: Adjust clothing prior to toileting, Adjust clothing after toileting Toileting Assistive Devices: Grab bar or rail  Toileting assist Assist level: Touching or steadying assistance (Pt.75%)    Transfers Chair/bed transfer   Chair/bed transfer method: Stand pivot Chair/bed transfer assist level: Touching or steadying assistance (Pt > 75%) Chair/bed transfer assistive device: Armrests     Locomotion Ambulation     Max distance: 125 Assist level: Touching or steadying assistance (Pt > 75%)   Wheelchair   Type: Manual Max wheelchair distance: 150 ft Assist Level: Moderate assistance (Pt 50 - 74%)  Cognition Comprehension Comprehension assist level: Follows basic conversation/direction with no assist  Expression Expression assist level: Expresses basic needs/ideas: With no assist  Social Interaction Social Interaction assist level: Interacts appropriately 75 - 89% of the time - Needs redirection for appropriate language or to initiate interaction.  Problem Solving Problem solving assist level: Solves basic 75 - 89% of the time/requires cueing 10 - 24% of the time  Memory Memory assist level: Recognizes or recalls 75 - 89% of the time/requires cueing 10 - 24% of the time   Medical Problem List and Plan: 1.  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  -continue CIR, PT, OT, SLP   -ELOS 12/23 2.  DVT Prophylaxis/Anticoagulation: Pharmaceutical: Lovenox 3. Pain Management: Back/sacral pain improved.    - Kpad and  balm 4. Mood: LCSW to follow for evaluation and support.  5. Neuropsych: This patient is not fully capable of making decisions on her own behalf. 6. Skin/Wound Care: routine pressure relief measures.  7. Fluids/Electrolytes/Nutrition: Monitor I/O. I personally reviewed the patient's labs today.   -BUN improved   -off IVF  -eating well 8. HTN:  Lisinopril on hold with control at present  9. T2DM:  Had been on Lantus 18 units daily with SSI for elevated BS- .  .   -nph increased to 16u  -novolog added with breakfast and lunch yesterday---  -continue tradjenta   -improved patterns over last 24 hours CBG (last  3)   Recent Labs  05/17/16 1632 05/17/16 2026 05/18/16 0613  GLUCAP 95 241* 125*    10 Anxiety/depression: team to provide ego support. She was on Abilify, Klonopin and Wellbutrin at home. Klonopin resumed.  resumed low dose Wellbutrin..   -continue prn xanax for breakthrough anxiety   11. Dyslipidemia: On Lipitor.    12. Nausea: improved  -continue prilosec  . Anxiety may be a component.    -ua equivocal--ucx with multispecies  -wbc's down to 9.7 most recently   LOS (Days) 8 A FACE TO FACE EVALUATION WAS PERFORMED  Oneka Parada T 05/18/2016 8:40 AM

## 2016-05-18 NOTE — Progress Notes (Signed)
Speech Language Pathology Weekly Progress and Session Note  Patient Details  Name: Chloe Owens MRN: [id number] Date of Birth: [birth year]  Beginning of progress report period: May 11, 2016 End of progress report period: May 18, 2016  Today's Date: 05/18/2016 SLP Individual Time: 1315-1400 SLP Individual Time Calculation (min): 45 min   Short Term Goals: Week 1: SLP Short Term Goal 1 (Week 1): Patient will utilize external aids to recall new, daily information with Mod A multimodal cues.  SLP Short Term Goal 1 - Progress (Week 1): Discontinued (comment) SLP Short Term Goal 2 (Week 1): Patient will demonstrate functional problem solving for basic tasks with Mod A multimodal cues.  SLP Short Term Goal 2 - Progress (Week 1): Discontinued (comment) SLP Short Term Goal 3 (Week 1): Patient will self-monitor and correct errors with functional tasks Mod A question cues.  SLP Short Term Goal 3 - Progress (Week 1): Discontinued (comment) SLP Short Term Goal 4 (Week 1): Patient will consume current diet with minimal overt s/s of aspiration with Mod I for use of swallowing compensatory strategies. SLP Short Term Goal 4 - Progress (Week 1): Not met SLP Short Term Goal 5 (Week 1): Patient will utilize an increased vocal intensity at the sentence level with Mod I to achieve 100% intelligibility.  SLP Short Term Goal 5 - Progress (Week 1): Not met    New Short Term Goals: Week 2: SLP Short Term Goal 1 (Week 2): Patient will consume current diet with minimal overt s/s of aspiration with Mod I for use of swallowing compensatory strategies. SLP Short Term Goal 2 (Week 2): Patient will utilize an increased vocal intensity at the sentence level with Mod I to achieve 100% intelligibility.   Weekly Progress Updates: Patient has made functional but inconsistent gains and has not met any STG's this reporting period. Currently, patient is consuming regular textures with thin liquids with minimal overt  s/s of aspiration and requires intermittent supervision verbal cues for use of swallowing compensatory strategies. Patient is also ~90-100% intelligible at the phrase and sentence level with intermittent supervision verbal cues needed for use of speech intelligibility strategies. Patient and family education is ongoing. Patient would benefit from continued skilled SLP intervention to maximize her swallowing and speech function prior to discharge home.    Intensity: Minumum of 1-2 x/day, 30 to 90 minutes Frequency: 3 to 5 out of 7 days Duration/Length of Stay: 12/23 Treatment/Interventions: Speech/Language facilitation;Patient/family education;Dysphagia/aspiration precaution training;Functional tasks;Cueing hierarchy   Daily Session  Skilled Therapeutic Interventions: Skilled treatment session focused on speech and swallowing goals. Patient consumed thin liquids without overt s/s of aspiration with Mod I but declined snack of regular textures despite multiple attempts and encouragement. Patient particiapted minimally in a functional conversation with encouragement in a moderately noisy environment and was 100% intelligible with supervision verbal cues. Patient handed off to OT. Continue with current plan of care.      Function:   Eating Eating   Modified Consistency Diet: No Eating Assist Level: Swallowing techniques: self managed           Cognition Comprehension Comprehension assist level: Follows basic conversation/direction with no assist  Expression   Expression assist level: Expresses basic needs/ideas: With no assist  Social Interaction Social Interaction assist level: Interacts appropriately 75 - 89% of the time - Needs redirection for appropriate language or to initiate interaction.  Problem Solving Problem solving assist level: Solves basic 75 - 89% of the time/requires cueing 10 - 24% of  the time  Memory Memory assist level: Recognizes or recalls 75 - 89% of the time/requires  cueing 10 - 24% of the time   Pain No reports of pain   Therapy/Group: Individual Therapy  Westlynn Fifer 05/18/2016, 3:03 PM

## 2016-05-18 NOTE — Progress Notes (Signed)
Physical Therapy Session Note  Patient Details  Name: Chloe Owens J Danielski MRN: [id number] Date of Birth: 1955/07/24  Today's Date: 05/18/2016 PT Individual Time: 1000-1040 PT Individual Time Calculation (min): 40 min    Short Term Goals: Week 1:  PT Short Term Goal 1 (Week 1): Pt will ambulate 50 ft with min A x 1. PT Short Term Goal 2 (Week 1): Pt will perform sit to stand and mat to chair transfer with min A x 1 PT Short Term Goal 3 (Week 1): Pt will perform supine to sit and sit to supine wiht min A PT Short Term Goal 4 (Week 1): Pt will ascend and descend 2 stairs with mod A.  Skilled Therapeutic Interventions/Progress Updates:   Pt received supine in recliner; denies pain and agreeable to treatment. Ambulatory transfer to w/c with minA. Gait in hallway to locate and retrieve numbered targets with minA overall; assist needed for facilitating weight shift to R side to improve LLE foot clearance. Increased assist needed with cognitive distractions and direction changes while looking for targets. Requires one rest break due to fatigue. After activity, pt reporting significant nausea; no emesis occurred however pt declines additional activities. Returned to room and transferred to bed minA stand pivot. Once supine, pt reports she needs to urinate. Ambulated in/out of bathroom with minA and cues to slow movement due to impulsivity with needing to urinate. Pt pulled down pants and performed hygiene without A; required assist for pulling pants over L hip due to LUE decreased fine motor control. Pt returned to bed as above. Remained supine in bed at end of session, all needs in reach. RN alerted to pt nausea, present at end of session to administer medication.   Therapy Documentation Precautions:  Precautions Precautions: Fall Restrictions Weight Bearing Restrictions: No General: PT Amount of Missed Time (min): 20 Minutes PT Missed Treatment Reason: Patient ill (Comment) (nausea)   See Function  Navigator for Current Functional Status.   Therapy/Group: Individual Therapy  Vista Lawmanlizabeth J Tygielski 05/18/2016, 10:43 AM

## 2016-05-18 NOTE — Progress Notes (Signed)
Occupational Therapy Session Note  Patient Details  Name: Chloe Owens MRN: [id number] Date of Birth: 01/04/1956  Today's Date: 05/18/2016 OT Individual Time: 0800-0900 and 1400-1430 OT Individual Time Calculation (min): 60 min and 30 min    Short Term Goals:Week 1:  OT Short Term Goal 1 (Week 1): Pt will demonstrate hemiplegic techniques for UB dressing with min A. OT Short Term Goal 2 (Week 1): Pt will demonstrate hemiplegic techniques for LB dressing with mod A in order to increase I in self care. OT Short Term Goal 3 (Week 1): Pt will perform toilet transfer with mod A to decrease level of assist with functional transfer. OT Short Term Goal 4 (Week 1): Pt will maintain standing balance with mod A during LB clothing management.   Skilled Therapeutic Interventions/Progress Updates:    Session One: Pt seen for ADL bathing/dressing session. Pt in supine upon arrival with NT present assisting with bedpan. [facility]our[facility] pt and NT [facility]  Throughout session, pt completed stand pivot transfers with overall min A and VCs for sequencing of steps. She bathed seated on tub bench, able to actively incorportate L UE to bathe, however, requires encouragement to use as gross stabilizer level.  She dressed seated in w/c, able to recall hemi dressing techniques. Assist required to hold L LE in ankle over knee position to thread pants and wash foot. She required seated rest breaks throughout session due to decreased activity tolerance.  Stood to brush hair at sink with CGA, improved from previous sessions. She sat to complete oral care, hand over hand assist provided to use L UE to grasp tioth brush during set-up. Completed x10 shoulder flexion using UE reacher, however, following activity pt with increased complaints of indegestion and fatigue, declining any further therapy. She ambulated ~794ft to recliner with min A and VCs for foot placement.  Pt left seated in recliner  at end of session, QRB donned and RN present administerering medications for reflux. Discussed with pt throughout session PLOF, benefits of OOB, importance of incorporation on L UE into functional tasks, and d/c planning.   Session Two: Pt seen for OT session focusing on fine motor coordination and attention to task. Pt received with hand off from SLP. Pt declined attending holiday party. Took pt to therapy gym in hopes of increased alertness/ participation with tasks. Completed fine motor task at table top level focusing on L UE fine motor coordination. Min hand over hand assist provided.  Pt requesting to return to room. On way back, "Chloe Owens" delivered gift to pt. In room, fine motor to open and retrieve items from gift bag. Cont to require max A VCs for encouragement as pt self limiting when not successful on first attempt.  She ambulated ~6 ft to recliner, Mod VCs and assist for weightshift to advance L LE. Pt left seated in recliner at end of session, husband present therefore QRB not donned. Instructed husband to alert staff if pt to be left unattended.   Therapy Documentation Precautions:  Precautions Precautions: Fall Restrictions Weight Bearing Restrictions: No  See Function Navigator for Current Functional Status.   Therapy/Group: Individual Therapy  Lewis, Jasn Xia C 05/18/2016, 7:09 AM

## 2016-05-19 ENCOUNTER — Inpatient Hospital Stay (HOSPITAL_COMMUNITY): Payer: Medicare Other | Admitting: Speech Pathology

## 2016-05-19 ENCOUNTER — Inpatient Hospital Stay (HOSPITAL_COMMUNITY): Payer: Medicare Other | Admitting: Physical Therapy

## 2016-05-19 ENCOUNTER — Inpatient Hospital Stay (HOSPITAL_COMMUNITY): Payer: Medicare Other | Admitting: Occupational Therapy

## 2016-05-19 LAB — BASIC METABOLIC PANEL
Anion gap: 5 (ref 5–15)
BUN: 13 mg/dL (ref 6–20)
CO2: 28 mmol/L (ref 22–32)
Calcium: 9.1 mg/dL (ref 8.9–10.3)
Chloride: 105 mmol/L (ref 101–111)
Creatinine, Ser: 1.1 mg/dL — ABNORMAL HIGH (ref 0.44–1.00)
GFR calc Af Amer: >60 mL/min (ref >60)
GFR calc non Af Amer: 53 mL/min — ABNORMAL LOW (ref >60)
Glucose, Bld: 107 mg/dL — ABNORMAL HIGH (ref 65–99)
Potassium: 3.6 mmol/L (ref 3.5–5.1)
Sodium: 138 mmol/L (ref 135–145)

## 2016-05-19 LAB — GLUCOSE, CAPILLARY
Glucose-Capillary: 54 mg/dL — ABNORMAL LOW (ref 65–99)
Glucose-Capillary: 94 mg/dL (ref 65–99)
Glucose-Capillary: 235 mg/dL — ABNORMAL HIGH (ref 65–99)
Glucose-Capillary: 83 mg/dL (ref 65–99)
Glucose-Capillary: 232 mg/dL — ABNORMAL HIGH (ref 65–99)

## 2016-05-19 MED ORDER — INSULIN NPH (HUMAN) (ISOPHANE) 100 UNIT/ML ~~LOC~~ SUSP
16.0000 [IU] | Freq: Two times a day (BID) | SUBCUTANEOUS | Status: DC
  Administered 2016-05-19 – 2016-05-23 (×8): 16 [IU] via SUBCUTANEOUS
  Filled 2016-05-19: qty 10

## 2016-05-19 MED ORDER — LIDOCAINE 5 % EX PTCH
1.0000 | MEDICATED_PATCH | CUTANEOUS | Status: DC
  Administered 2016-05-19 – 2016-05-26 (×8): 1 via TRANSDERMAL
  Filled 2016-05-19 (×9): qty 1

## 2016-05-19 NOTE — Progress Notes (Signed)
Physical Therapy Session Note  Patient Details  Name: Chloe Owens MRN: [id number] Date of Birth: August 08, 1955  Today's Date: 05/19/2016 PT Individual Time: 1107-1207 PT Individual Time Calculation (min): 60 min    Short Term Goals: Week 2:     Skilled Therapeutic Interventions/Progress Updates:    Therapy initiated today with pt lying in bed. Pt reports that she is very tired but does agree to participation today.  Pt continues to demonstrate poor problem solving with ambulation in hallway when needing to maneuver around objects.  Therapist and pt worked today on slowing down when approaching obstacles, and problem solving how to maneuver walker and self correctly.  Pt worked on ambulation today with L ankle ace-wrapped into dorsiflexion, while this did help with foot clearance, pt continues to require significant manual cueing to R in order to clear L foot.  Pt continues to require assist for manual wheelchair propulsion and impulsivity when returning to chair after ambulation.  Despite attempting blocked practice for safe technique with sit to stand to walker from wheelchair, pt continues to require constant verbal cueing for safety.  Pt was left in bed with 3 rails up, bed alarm on, and all needs met.  Pt pain rating today was 5/10 in low back.  Pt returned to bed to rest due to pain/fatigue c/o. Therapy Documentation Precautions:  Precautions Precautions: Fall Restrictions Weight Bearing Restrictions: No  See Function Navigator for Current Functional Status.   Therapy/Group: Individual Therapy  Cordaro Mukai Hilario Quarry 05/19/2016, 12:17 PM

## 2016-05-19 NOTE — Progress Notes (Signed)
Speech Language Pathology Daily Session Note  Patient Details  Name: Chloe Owens MRN: [name] Date of Birth: [birth year]  Today's Date: 05/19/2016 SLP Individual Time: [id number] SLP Individual Time Calculation (min): 30 min   Short Term Goals: Week 2: SLP Short Term Goal 1 (Week 2): Patient will consume current diet with minimal overt s/s of aspiration with Mod I for use of swallowing compensatory strategies. SLP Short Term Goal 2 (Week 2): Patient will utilize an increased vocal intensity at the sentence level with Mod I to achieve 100% intelligibility.   Skilled Therapeutic Interventions:  Pt was seen for skilled ST targeting dysphagia goals.  SLP completed skilled observations during presentations of pt's currently prescribed diet.  Pt consumed a grilled cheese sandwich, short bread cookies, pudding, and thin liquids with mod I use of swallowing precautions following tray set up.  No overt s/s of aspiration were evident with solids or liquids.  Pt's mastication of solids was somewhat prolonged; however, she was able to clear residuals from the oral cavity with extra time.  Pt was left in bed with bed alarm set and call bell within reach.  Continue per current plan of care.    Function:  Eating Eating   Modified Consistency Diet: No Eating Assist Level: Swallowing techniques: self managed;Set up assist for   Eating Set Up Assist For: Opening containers       Cognition Comprehension Comprehension assist level: Follows basic conversation/direction with no assist  Expression   Expression assist level: Expresses basic needs/ideas: With no assist  Social Interaction Social Interaction assist level: Interacts appropriately 75 - 89% of the time - Needs redirection for appropriate language or to initiate interaction.  Problem Solving Problem solving assist level: Solves basic 75 - 89% of the time/requires cueing 10 - 24% of the time  Memory Memory assist level: Recognizes or recalls 75  - 89% of the time/requires cueing 10 - 24% of the time    Pain Pain Assessment Pain Assessment: No/denies pain  Therapy/Group: Individual Therapy  Chloe Owens, Chloe Owens 05/19/2016, 1:50 PM

## 2016-05-19 NOTE — Progress Notes (Signed)
Physical Therapy Session Note  Patient Details  Name: Chloe Owens MRN: [id number] Date of Birth: [birth year]  Today's Date: 05/19/2016 PT Individual Time: 8381-8403 PT Individual Time Calculation (min): 27 min    Short Term Goals: Week 2:     Skilled Therapeutic Interventions/Progress Updates:    Pt resting in bed with no c/o pain and agreeable to therapy session.  Session focus on LUE NMR and L attention using dynavision in standing and sitting, mode A x2 minutes each trial.   Standing Trial 1: 4 hits with LUE, average reaction time 15 seconds (1 min trial) Standing Trial 2: 20 hits with BUE, average reaction time 16 seconds (2 min trial)  Sitting Trial 1: 72 hits with RUE, average reaction time 1.67 seconds (2 min trial) Sitting Trial 2: 14 hits with LUE (hand over hand assist from PT for lights above shoulder level), average reaction time 8.57 seconds  Pt returned to room at end of session and performed squat/pivot to recliner, left positioned to comfort with call bell in reach and needs met.   Therapy Documentation Precautions:  Precautions Precautions: Fall Restrictions Weight Bearing Restrictions: No   See Function Navigator for Current Functional Status.   Therapy/Group: Individual Therapy  Earnest Conroy Penven-Crew 05/19/2016, 4:35 PM

## 2016-05-19 NOTE — Progress Notes (Signed)
[facility]     PROGRESS NOTE    Subjective/Complaints: Low cbg's this morning. Didn't eat quite as well yesterday.  ROS: pt denies nausea, vomiting, diarrhea, cough, shortness of breath or chest pain    Objective: Vital Signs: Blood pressure 116/64, pulse 94, temperature 98.3 F (36.8 C), temperature source Oral, resp. rate 16, height 5\' 8"  (1.727 m), weight 77.2 kg (170 lb 3.1 oz), SpO2 94 %. No results found.  Recent Labs  05/17/16 0649  WBC 8.8  HGB 12.8  HCT 36.9  PLT 252    Recent Labs  05/17/16 0649 05/19/16 0455  NA 142 138  K 3.3* 3.6  CL 107 105  GLUCOSE 84 107*  BUN 12 13  CREATININE 0.86 1.10*  CALCIUM 8.9 9.1   CBG (last 3)   Recent Labs  05/19/16 0656 05/19/16 0731 05/19/16 1211  GLUCAP 54* 94 235*    Wt Readings from Last 3 Encounters:  05/17/16 77.2 kg (170 lb 3.1 oz)  05/23/14 72.6 kg (160 lb)  05/09/12 82.1 kg (181 lb)    Physical Exam:  Constitutional: flat.  HENT:  Head: Normocephalic and atraumatic.  Mouth/Throat: Oropharynx is pink/moist.  Eyes: PERRL.  Neck: Normal range of motion. Neck supple.  Cardiovascular: RRR   No murmur heard. Respiratory: clear GI: Soft. Bowel sounds are normal. She exhibits no distension. There is no tenderness.  Musculoskeletal: She exhibits no edema or tenderness.  Neurological: She is alert and oriented to person, place, and time.  Left facial weakness/central 7. LUQ anopsia persistent  Oriented to self/place/reason she's here.  Left inattention persists. Engages more Motor: RUE/RLE: 4+/5 proximal to distal LUE/LLE: 3+ to 4/5 proximal to distal with inconsistent activation Emerging tone in biceps, shoulder 1/4 Sensation intact to light touch   Skin: Skin is warm and dry. No sacral breakdown Psychiatric: Her affect is flat Her speech is delayed.     Assessment/Plan: 1. Functional, cognitive, and mobility deficits secondary to right MCA infarct which  require 3+ hours per day of interdisciplinary therapy in a comprehensive inpatient rehab setting. Physiatrist is providing close team supervision and 24 hour management of active medical problems listed below. Physiatrist and rehab team continue to assess barriers to discharge/monitor patient progress toward functional and medical goals.  Function:  Bathing Bathing position   Position: Wheelchair/chair at sink  Bathing parts Body parts bathed by patient: Right arm, Chest, Abdomen, Right upper leg, Left upper leg, Left arm, Right lower leg, Left lower leg, Front perineal area, Buttocks Body parts bathed by helper: Back  Bathing assist Assist Level: Touching or steadying assistance(Pt > 75%)      Upper Body Dressing/Undressing Upper body dressing   What is the patient wearing?: Pull over shirt/dress     Pull over shirt/dress - Perfomed by patient: Thread/unthread right sleeve, Put head through opening, Thread/unthread left sleeve, Pull shirt over trunk Pull over shirt/dress - Perfomed by helper: Pull shirt over trunk        Upper body assist Assist Level: Set up, Supervision or verbal cues   Set up : To obtain clothing/put away  Lower Body Dressing/Undressing Lower body dressing   What is the patient wearing?: Underwear, Pants, Shoes Underwear - Performed by patient: Thread/unthread right underwear leg, Thread/unthread left underwear leg, Pull underwear up/down Underwear - Performed by helper: Pull underwear up/down Pants- Performed by patient: Thread/unthread right pants leg, Thread/unthread left pants leg, Pull pants up/down Pants- Performed by helper: Pull pants up/down Non-skid  slipper socks- Performed by patient: Don/doff right sock, Don/doff left sock Non-skid slipper socks- Performed by helper: Don/doff right sock, Don/doff left sock     Shoes - Performed by patient: Don/doff right shoe, Don/doff left shoe            Lower body assist Assist for lower body dressing:  Touching or steadying assistance (Pt > 75%)      Toileting Toileting   Toileting steps completed by patient: Performs perineal hygiene, Adjust clothing prior to toileting Toileting steps completed by helper: Adjust clothing after toileting Toileting Assistive Devices: Grab bar or rail  Toileting assist Assist level: Touching or steadying assistance (Pt.75%)   Transfers Chair/bed transfer   Chair/bed transfer method: Stand pivot Chair/bed transfer assist level: Touching or steadying assistance (Pt > 75%) Chair/bed transfer assistive device: Bedrails, Armrests     Locomotion Ambulation     Max distance: 100 ft Assist level: Touching or steadying assistance (Pt > 75%)   Wheelchair   Type: Manual Max wheelchair distance: 75 ft Assist Level: Moderate assistance (Pt 50 - 74%)  Cognition Comprehension Comprehension assist level: Follows basic conversation/direction with no assist  Expression Expression assist level: Expresses basic needs/ideas: With no assist  Social Interaction Social Interaction assist level: Interacts appropriately 75 - 89% of the time - Needs redirection for appropriate language or to initiate interaction.  Problem Solving Problem solving assist level: Solves basic 75 - 89% of the time/requires cueing 10 - 24% of the time  Memory Memory assist level: Recognizes or recalls 75 - 89% of the time/requires cueing 10 - 24% of the time   Medical Problem List and Plan: 1.  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  -continue CIR, PT, OT, SLP   -ELOS 12/23 2.  DVT Prophylaxis/Anticoagulation: Pharmaceutical: Lovenox 3. Pain Management: Back/sacral pain generally improved.    - Kpad and  balm 4. Mood: LCSW to follow for evaluation and support.  5. Neuropsych: This patient is not fully capable of making decisions on her own behalf. 6. Skin/Wound Care: routine pressure relief measures.  7.  Fluids/Electrolytes/Nutrition: Monitor I/O. I personally reviewed the patient's labs today.   -BUN holding steady off IVF  -eating well---continue to encourage intake  -potassium improved--last supp dose today 8. HTN:  Lisinopril on hold with control at present  9. T2DM:  Had been on Lantus 18 units daily with SSI for elevated BS- .  .   -nph recently increased to 16u bid. Today,  pm dose adjusted to supper time to avoid morning lows  -novolog  with breakfast and lunch   -holding tradjenta for now   CBG (last 3)   Recent Labs  05/19/16 0656 05/19/16 0731 05/19/16 1211  GLUCAP 54* 94 235*    10 Anxiety/depression: team to provide ego support. She was on Abilify, Klonopin and Wellbutrin at home. Klonopin resumed.  resumed low dose Wellbutrin..   -continue prn xanax for breakthrough anxiety   11. Dyslipidemia: On Lipitor.    12. Nausea: improved  -continue prilosec  . Anxiety may be a component.    -ua equivocal--ucx with multispecies  -wbc's down to 9.7 most recently   LOS (Days) 9 A FACE TO FACE EVALUATION WAS PERFORMED  Chloe Owens T 05/19/2016 12:28 PM

## 2016-05-19 NOTE — Progress Notes (Signed)
Occupational Therapy Session Note  Patient Details  Name: Chloe Owens J Archambeault MRN: [id number] Date of Birth: 07-08-1955  Today's Date: 05/19/2016 OT Individual Time: [id number] OT Individual Time Calculation (min): 25 min     Short Term Goals: Week 2:  OT Short Term Goal 1 (Week 2): STG=LTG due to ELOS  Skilled Therapeutic Interventions/Progress Updates:    Treatment session with focus on stand pivot transfers and LUE NMR.  Completed stand pivot transfer recliner > w/c > therapy mat to pt's Rt with min assist.  Pt requires increased cues for technique and initiation due to reports of fearfulness with standing.  Engaged in LUE NMR with focus on weight bearing through LUE in extension and abduction to increase passive and active stress during reaching activity with RUE.  Therapist providing manual facilitation to maintain weight bearing and stretch during reaching.  Post weight bearing, engaged in reaching and grasping with LUE to pick up cups and stack them.  Utilized UE Ranger with focus on shoulder flexion with reaching forwards and in abduction to continue to address tone in elbow and shoulder.  Returned mat > w/c > recliner to pt's Lt with mod assist and tactile cues along trunk due to Lt lean during transfer to Lt.  Therapy Documentation Precautions:  Precautions Precautions: Fall Restrictions Weight Bearing Restrictions: No Pain: Pain Assessment Pain Assessment: No/denies pain  See Function Navigator for Current Functional Status.   Therapy/Group: Individual Therapy  Rosalio LoudHOXIE, Cipriana Biller 05/19/2016, 9:59 AM

## 2016-05-19 NOTE — Progress Notes (Addendum)
Occupational Therapy Weekly Progress Note  Patient Details  Name: Chloe Owens MRN: [id number] Date of Birth: [birth year]  Beginning of progress report period: May 11, 2016 End of progress report period: May 19, 2016  Today's Date: 05/19/2016 OT Individual Time: 0800-0900 and 1400-1430 OT Individual Time Calculation (min): 60 min and 30 min   Patient has met 4 of 4 short term goals.  Pt making steady progress towards OT goals. She is min A overall, however, requires increased time, encouragement and rest breaks during all tasks. She cont to require assist for standing balance and functional transfers due to decreased standing balance. Pt with decreased carryover of education of hemi-dressing technique. She can complete bathing/ dressing with steadying assist, however, is quick to ask for unneeded assistance when task requires increased effort and incorporation of weaker R UE.    Patient continues to demonstrate the following deficits: abnormal posture, cognitive deficits, hemiplegia affecting non-dominant side and coordination disorder, weakness and therefore will continue to benefit from skilled OT intervention to enhance overall performance with BADL and Reduce care partner burden.  Patient progressing toward long term goals..  Continue plan of care.  OT Short Term Goals Week 1:  OT Short Term Goal 1 (Week 1): Pt will demonstrate hemiplegic techniques for UB dressing with min A. OT Short Term Goal 1 - Progress (Week 1): Met OT Short Term Goal 2 (Week 1): Pt will demonstrate hemiplegic techniques for LB dressing with mod A in order to increase I in self care. OT Short Term Goal 2 - Progress (Week 1): Met OT Short Term Goal 3 (Week 1): Pt will perform toilet transfer with mod A to decrease level of assist with functional transfer. OT Short Term Goal 3 - Progress (Week 1): Met OT Short Term Goal 4 (Week 1): Pt will maintain standing balance with mod A during LB clothing management.   OT Short Term Goal 4 - Progress (Week 1): Met Week 2:  OT Short Term Goal 1 (Week 2): STG=LTG due to ELOS   Skilled Therapeutic Interventions/Progress Updates:    Session One: Pt seen for OT ADL bathing/dressing session. Pt received with hand off from NT assisting pt with breakfast.  She transferred to EOB with HOB elevated, requiring assist to reposition L hip to EOB. She ambulated with mod A and VCs for RW management and attention to L LE into bathroom and completed toileting task with steadying assist (did not do clothing management following void as going to bathe following task).  She ambulated back to w/c at sink to complete bathing task. Hand over hand assist required to use L UE to reach R armpit. She demonstrated ability to reacher remainder of arm without assist. Steadying assist required for pt to complete buttock  Hygiene/ pericare. She cont to require VCs for use of hemi dressing technique. With increased time and encouragement, pt able to dress with supervision, however is quick to ask for un-needed assist. Throughout bathing/dressnig/ grooming tasks pt displayed difficulty with sequencing, never initiating the next step of task. However, once encouraged to cont, pt correctly follows through with remainder of task.  Completed fine motor task at table top level focusing on gross grasp with R UE picking up large pieces of fruit. Pt demonstrated ability to pick up large/ taller items vs. Object that is closer to table. Cont to require encouragement to attempt task. She completed stand pivot transfer with hand held assist to recliner. Left seated in recliner with RN present, all needs  in reach.   Session Two: Pt seen for OT session focusing on functional transfers and caregiver education. Pt in supine upon arrival with husband present. Pt agreeable to tx session. She transferred to EOB and ambulated with min A with VCs for weightshift to advance L LE and RW management. In ADL apartment, pt  completed bed mobility on standard bed with supervision/ VCs for technique. She ambulated from EOB> bathroom with assist required for attention to obstacles on L side with VCs for managing RW around obstacles. Completed simulated tub/shower transfer utilizing tub bench. Completed with overall min A with VCs for technique. Educated pt and caregiver regarding recommendation for hand held shower head and grab bars. Pt returned to room at end of session, transferred to recliner and all needs in reach.   Therapy Documentation Precautions:  Precautions Precautions: Fall Restrictions Weight Bearing Restrictions: No Pain:   No/ denies pain  See Function Navigator for Current Functional Status.   Therapy/Group: Individual Therapy  Lewis, Zacary Bauer C 05/19/2016, 6:37 AM

## 2016-05-19 NOTE — Progress Notes (Signed)
Social Work Patient ID: Chloe Owens, female   DOB: [birth year], 60 y.o.   MRN: [id number]  Anselm PancoastLucy R Nadira Single, LCSW Social Worker Signed   Patient Care Conference Date of Service: 05/16/2016  6:06 PM      Hide copied text Hover for attribution information Inpatient RehabilitationTeam Conference and Plan of Care Update Date: 05/16/2016   Time: 2:10 pm      [facility]      Medical Record Number: [id number]  Date of Birth: [birth year] Sex: Female         Room/Bed: 4W01C/4W01C-01 Payor Info: Payor: MEDICARE / Plan: MEDICARE PART A AND B / Product Type: *No Product type* /     Admitting Diagnosis: CVA  Admit Date/Time:  05/10/2016  3:59 PM Admission Comments: No comment available    Primary Diagnosis:  <principal problem not specified> Principal Problem: <principal problem not specified>       Patient Active Problem List    Diagnosis Date Noted  . Acute ischemic right MCA stroke (HCC) 05/10/2016  . Cognitive deficit, post-stroke    . Adjustment disorder with mixed anxiety and depressed mood    . Nausea without vomiting    . 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 Dysarthria, post-stroke    . Hemiparesis of left nondominant side due to cerebral infarction (HCC)    . History of CVA with residual deficit    . Benign essential HTN    . Non-rheumatic mitral regurgitation    . Tobacco abuse    . Leukocytosis    . Dysphagia, post-stroke    . Depression 05/07/2016  . Generalized anxiety disorder 05/07/2016  . Ankle fracture, left 04/14/2012  . Fall due to stumbling 04/14/2012  . Hypoglycemia 04/14/2012  . Hypotension 04/14/2012  . Diabetes mellitus (HCC) 04/14/2012  . Hyperlipidemia 04/14/2012  . History of CVA (cerebrovascular accident) 04/14/2012      Expected Discharge Date: Expected Discharge Date: 05/27/16   Team Members Present: Physician leading conference: Dr. Faith RogueZachary Swartz Social Worker Present: Amada JupiterLucy Tyera Hansley, LCSW Nurse  Present: Carmie EndAngie Joyce, RN PT Present: Alyson ReedyElizabeth Tygielski, PT OT Present: Johnsie CancelAmy Lewis, OT SLP Present: Fae PippinMelissa Bowie, SLP PPS Coordinator present : Tora DuckMarie Noel, RN, CRRN       Current Status/Progress Goal Weekly Team Focus  Medical   cva with left hemiparesis and inattention, anxieyt issues,   improve visual=spatial awareness  anxiety, nutrition, safety   Bowel/Bladder   Continent of bowel and bladder. LBM 05/14/16  Pt to remain continent of bowel and bladder  Assist to bathroom or BSC at night   Swallow/Nutrition/ Hydration   Regular textures with thin liquids, full supervision for use of strategies   Mod I  Increase use of swallow strategies    ADL's   Mod-max A LB dressing; Mod A toileting; Mod- max A standing balance; Mod A stand pivot transfers  Suprevision overall  ADL re-training, standing balance, activity tolerance, Neuro re-ed   Mobility   S bed mobility, minA transfers, gait with RW  S overall, minA stairs  BLE NMR, gait training, transfer training, activity tolerance   Communication   Supervision  Mod I   Increased use of speech intelligibility strategies    Safety/Cognition/ Behavioral Observations At baseline          Pain   No c/o pain  <3  Monitor for nonverbal cues of pain   Skin   CDI-Intermitent bruising to abdomen  CDI  Assess q shift     Rehab Goals Patient on target to meet rehab goals: Yes *See Care Plan and progress notes for long and short-term goals.   Barriers to Discharge: safety awareness, left visual-spatial deficits   Possible Resolutions to Barriers:  continued v-s training, safety training, family ed, supervision at home   Discharge Planning/Teaching Needs:  Plan to return home with spouse who can provide 24/7 assistance.  To be done closer to d/c   Team Discussion:  Poor sleep; anxiety; adjusting DM meds.  Min - mod assist overall with PT/OT and needs cues to use left hand.  ST d/c'd cogn goals as husband reports she is at baseline.  Still a full  supervision for po but on reg diet with thin.  Setting supervision goals but may require some downgrading by next team conference.  Revisions to Treatment Plan:  None    Continued Need for Acute Rehabilitation Level of Care: The patient requires daily medical management by a physician with specialized training in physical medicine and rehabilitation for the following conditions: Daily direction of a multidisciplinary physical rehabilitation program to ensure safe treatment while eliciting the highest outcome that is of practical value to the patient.: Yes Daily medical management of patient stability for increased activity during participation in an intensive rehabilitation regime.: Yes Daily analysis of laboratory values and/or radiology reports with any subsequent need for medication adjustment of medical intervention for : Mood/behavior problems   Parminder Trapani 05/18/2016, 6:26 AM

## 2016-05-19 NOTE — Progress Notes (Addendum)
Patient had  Low BS this am--is getting NPH at bedtime instead of supper leading to highs at bedtime. Will adjust NPH to breakfast/supper and hold trajenta today. Hold am meal coverage.   Has been labile and reporting increased anxiety and fear regarding current situation to nursing last pm. Increased Wellbutrin to bid and extra dose Klonopin given last pm. May need ability resumed.

## 2016-05-20 DIAGNOSIS — E1149 Type 2 diabetes mellitus with other diabetic neurological complication: Secondary | ICD-10-CM

## 2016-05-20 LAB — GLUCOSE, CAPILLARY
Glucose-Capillary: 179 mg/dL — ABNORMAL HIGH (ref 65–99)
Glucose-Capillary: 379 mg/dL — ABNORMAL HIGH (ref 65–99)
Glucose-Capillary: 56 mg/dL — ABNORMAL LOW (ref 65–99)
Glucose-Capillary: 76 mg/dL (ref 65–99)
Glucose-Capillary: 77 mg/dL (ref 65–99)

## 2016-05-20 NOTE — Plan of Care (Signed)
Problem: RH BOWEL ELIMINATION Goal: RH STG MANAGE BOWEL WITH ASSISTANCE STG Manage Bowel with min  Assistance.  Outcome: Not Progressing To give supp this evening

## 2016-05-20 NOTE — Progress Notes (Signed)
Chloe Owens is a 60 y.o. female November 04, 1955 [id number]  Subjective: Reserved related to neuro deficits. complains of right side ache, not positional or reproducible with touch, activity. No other new problems. Slept well.  Objective: Vital signs in last 24 hours: Temp:  [98 F (36.7 C)-98.2 F (36.8 C)] 98 F (36.7 C) (12/16 0527) Pulse Rate:  [78-87] 78 (12/16 0527) Resp:  [18-20] 18 (12/16 0527) BP: (131-138)/(66-71) 138/71 (12/16 0527) SpO2:  [97 %-98 %] 98 % (12/16 0527) Weight change:  Last BM Date: 05/17/16 (pt reported large last pm,)  Intake/Output from previous day: 12/15 0701 - 12/16 0700 In: 580 [P.O.:580] Out: -   Physical Exam General: No apparent distress   Sitting upright in bed Lungs: Normal effort. Lungs clear to auscultation, no crackles or wheezes. Cardiovascular: Regular rate and rhythm, no edema Neurological: No new neurological deficits Skin: no bruise or rash over R flank area of pt concern.  Lab Results: BMET    Component Value Date/Time   NA 138 05/19/2016 0455   K 3.6 05/19/2016 0455   CL 105 05/19/2016 0455   CO2 28 05/19/2016 0455   GLUCOSE 107 (H) 05/19/2016 0455   BUN 13 05/19/2016 0455   CREATININE 1.10 (H) 05/19/2016 0455   CALCIUM 9.1 05/19/2016 0455   GFRNONAA 53 (L) 05/19/2016 0455   GFRAA >60 05/19/2016 0455   CBC    Component Value Date/Time   WBC 8.8 05/17/2016 0649   RBC 4.32 05/17/2016 0649   HGB 12.8 05/17/2016 0649   HCT 36.9 05/17/2016 0649   PLT 252 05/17/2016 0649   MCV 85.4 05/17/2016 0649   MCH 29.6 05/17/2016 0649   MCHC 34.7 05/17/2016 0649   RDW 12.7 05/17/2016 0649   LYMPHSABS 2.3 05/11/2016 0521   MONOABS 0.8 05/11/2016 0521   EOSABS 0.1 05/11/2016 0521   BASOSABS 0.0 05/11/2016 0521   CBG's (last 3):   Recent Labs  05/19/16 1636 05/19/16 2055 05/20/16 0640  GLUCAP 83 232* 179*   LFT's Lab Results  Component Value Date   ALT 14 05/11/2016   AST 13 (L) 05/11/2016   ALKPHOS 66 05/11/2016   BILITOT 1.1 05/11/2016    Studies/Results: No results found.  Medications:  I have reviewed the patient's current medications. Scheduled Medications: . aspirin EC  325 mg Oral Daily  . atorvastatin  40 mg Oral q1800  . buPROPion  100 mg Oral BID  . clonazePAM  0.5 mg Oral BID  . clopidogrel  75 mg Oral Daily  . enoxaparin (LOVENOX) injection  40 mg Subcutaneous Q24H  . insulin aspart  0-15 Units Subcutaneous TID WC  . insulin aspart  0-5 Units Subcutaneous QHS  . insulin aspart  5 Units Subcutaneous BID WC  . insulin NPH Human  16 Units Subcutaneous BID AC  . lidocaine  1 patch Transdermal Q24H  . linagliptin  5 mg Oral Daily  . pantoprazole  40 mg Oral BID AC   PRN Medications: acetaminophen, alum & mag hydroxide-simeth, bisacodyl, diphenhydrAMINE, guaiFENesin-dextromethorphan, prochlorperazine **OR** prochlorperazine **OR** prochlorperazine, senna-docusate, sodium phosphate, traMADol, traZODone  Assessment/Plan: Active Problems:   Acute ischemic right MCA stroke (HCC)   Cognitive deficit, post-stroke   Adjustment disorder with mixed anxiety and depressed mood   Nausea without vomiting   Dysarthria, post-stroke   Hemiparesis of left nondominant side due to cerebral infarction (HCC)  1. 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             -  continue CIR, PT, OT, SLP                -ELOS 12/23 2. DVT Prophylaxis/Anticoagulation: Pharmaceutical: Lovenox 3. Pain Management: Back/sacral pain generally improved, with continued R flank ache; continue heating pad and muscle rub as ordered, support and reassurance 4. Mood: LCSW to follow for evaluation and support.  5. Neuropsych: This patient is not fully capable of making decisions on her own behalf. 6. Skin/Wound Care: routine pressure relief measures.  7. Fluids/Electrolytes/Nutrition: Monitor I/O. I personally reviewed the patient's labs today.              -eating  well---continue to encourage intake 8. HTN:  Lisinopril on hold with control at present 9. T2DM:  prn SSI for elevated cbg             -nph 16u bid.              -novolog with breakfast and lunch     10 Anxiety/depression: team to provide ego support. She was on Abilify, Klonopin and Wellbutrin at home. Klonopin and low dose Wellbutrin resumed              -continue prn xanax for breakthrough anxiety  11. Dyslipidemia: On Lipitor.      Length of stay, days: 10  Tyiesha Brackney A. Felicity CoyerLeschber, MD 05/20/2016, 9:51 AM

## 2016-05-21 ENCOUNTER — Inpatient Hospital Stay (HOSPITAL_COMMUNITY): Payer: Medicare Other | Admitting: Physical Therapy

## 2016-05-21 LAB — GLUCOSE, CAPILLARY
Glucose-Capillary: 247 mg/dL — ABNORMAL HIGH (ref 65–99)
Glucose-Capillary: 226 mg/dL — ABNORMAL HIGH (ref 65–99)
Glucose-Capillary: 51 mg/dL — ABNORMAL LOW (ref 65–99)
Glucose-Capillary: 94 mg/dL (ref 65–99)
Glucose-Capillary: 163 mg/dL — ABNORMAL HIGH (ref 65–99)

## 2016-05-21 NOTE — Plan of Care (Signed)
Problem: RH BOWEL ELIMINATION Goal: RH STG MANAGE BOWEL WITH ASSISTANCE STG Manage Bowel with min  Assistance.  Outcome: Not Progressing No BM at this time. PRN medication provided

## 2016-05-21 NOTE — Progress Notes (Signed)
Chloe Owens is a 60 y.o. female 08/19/1955 [id number]  Subjective: No concerns - Reports discomfort in flank/back not bad at present. Asks about date for DC home and desire for LUE to recover at quicker pace  Objective: Vital signs in last 24 hours: Temp:  [97.7 F (36.5 C)-98.4 F (36.9 C)] 98.4 F (36.9 C) (12/17 0551) Pulse Rate:  [84-85] 84 (12/17 0551) Resp:  [16] 16 (12/17 0551) BP: (139-152)/(62-74) 139/62 (12/17 0551) SpO2:  [94 %-95 %] 94 % (12/17 0551) Weight change:  Last BM Date: 05/17/16  Intake/Output from previous day: 12/16 0701 - 12/17 0700 In: 600 [P.O.:600] Out: -   Physical Exam General: No apparent distress   Sitting upright in bed Lungs: Normal effort. Lungs clear to auscultation, no crackles or wheezes. Cardiovascular: Regular rate and rhythm, no edema Neurological: No new neurological deficits   Lab Results: BMET    Component Value Date/Time   NA 138 05/19/2016 0455   K 3.6 05/19/2016 0455   CL 105 05/19/2016 0455   CO2 28 05/19/2016 0455   GLUCOSE 107 (H) 05/19/2016 0455   BUN 13 05/19/2016 0455   CREATININE 1.10 (H) 05/19/2016 0455   CALCIUM 9.1 05/19/2016 0455   GFRNONAA 53 (L) 05/19/2016 0455   GFRAA >60 05/19/2016 0455   CBC    Component Value Date/Time   WBC 8.8 05/17/2016 0649   RBC 4.32 05/17/2016 0649   HGB 12.8 05/17/2016 0649   HCT 36.9 05/17/2016 0649   PLT 252 05/17/2016 0649   MCV 85.4 05/17/2016 0649   MCH 29.6 05/17/2016 0649   MCHC 34.7 05/17/2016 0649   RDW 12.7 05/17/2016 0649   LYMPHSABS 2.3 05/11/2016 0521   MONOABS 0.8 05/11/2016 0521   EOSABS 0.1 05/11/2016 0521   BASOSABS 0.0 05/11/2016 0521   CBG's (last 3):    Recent Labs  05/20/16 1751 05/20/16 2058 05/21/16 0642  GLUCAP 76 77 247*   LFT's Lab Results  Component Value Date   ALT 14 05/11/2016   AST 13 (L) 05/11/2016   ALKPHOS 66 05/11/2016   BILITOT 1.1 05/11/2016    Studies/Results: No results found.  Medications:  I have  reviewed the patient's current medications. Scheduled Medications: . aspirin EC  325 mg Oral Daily  . atorvastatin  40 mg Oral q1800  . buPROPion  100 mg Oral BID  . clonazePAM  0.5 mg Oral BID  . clopidogrel  75 mg Oral Daily  . enoxaparin (LOVENOX) injection  40 mg Subcutaneous Q24H  . insulin aspart  0-15 Units Subcutaneous TID WC  . insulin aspart  0-5 Units Subcutaneous QHS  . insulin aspart  5 Units Subcutaneous BID WC  . insulin NPH Human  16 Units Subcutaneous BID AC  . lidocaine  1 patch Transdermal Q24H  . linagliptin  5 mg Oral Daily  . pantoprazole  40 mg Oral BID AC   PRN Medications: acetaminophen, alum & mag hydroxide-simeth, bisacodyl, diphenhydrAMINE, guaiFENesin-dextromethorphan, prochlorperazine **OR** prochlorperazine **OR** prochlorperazine, senna-docusate, sodium phosphate, traMADol, traZODone  Assessment/Plan: Active Problems:   Acute ischemic right MCA stroke (HCC)   Cognitive deficit, post-stroke   Adjustment disorder with mixed anxiety and depressed mood   Nausea without vomiting   Dysarthria, post-stroke   Hemiparesis of left nondominant side due to cerebral infarction (HCC)  1. 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             -continue CIR, PT, OT, SLP                -  ELOS 12/23 2. DVT Prophylaxis/Anticoagulation: Pharmaceutical: Lovenox 3. Pain Management: Back/sacral pain generally improved, continue heating pad and muscle rub as ordered, support and reassurance 4. Mood: LCSW to follow for evaluation and support.  5. Neuropsych: This patient is not fully capable of making decisions on her own behalf. 6. Skin/Wound Care: routine pressure relief measures.  7. Fluids/Electrolytes/Nutrition: Monitor I/O. I personally reviewed the patient's labs today.              -eating well---continue to encourage intake 8. HTN:  Lisinopril on hold with control at present 9. T2DM:  prn SSI for elevated  cbg             -nph 16u bid.              -novolog with breakfast and lunch     10 Anxiety/depression: team to provide ego support. She was on Abilify, Klonopin and Wellbutrin at home. Klonopin and low dose Wellbutrin resumed              -continue prn xanax for breakthrough anxiety  11. Dyslipidemia: On Lipitor.      Length of stay, days: 11  Maury Groninger A. Felicity CoyerLeschber, MD 05/21/2016, 9:52 AM

## 2016-05-21 NOTE — Progress Notes (Signed)
Physical Therapy Session Note  Patient Details  Name: Chloe Owens MRN: [id number] Date of Birth: 04/19/1956  Today's Date: 05/21/2016 PT Individual Time: [id number] PT Individual Time Calculation (min): 45 min    Short Term Goals: Week 1:  PT Short Term Goal 1 (Week 1): Pt will ambulate 50 ft with min A x 1. PT Short Term Goal 2 (Week 1): Pt will perform sit to stand and mat to chair transfer with min A x 1 PT Short Term Goal 3 (Week 1): Pt will perform supine to sit and sit to supine wiht min A PT Short Term Goal 4 (Week 1): Pt will ascend and descend 2 stairs with mod A.  Skilled Therapeutic Interventions/Progress Updates:    Pt received in recliner & agreeable to tx with c/o stomach pain 2/2 constipation - RN notified. Pt's husband present initally during session & reported he assisted her bed>recliner. Educated pt & husband on need to be checked off by therapy to assist pt with transfers & pt voiced understanding. Pt propelled w/c with R hemi technique & min assist x 50 ft with cuing for hand placement and propulsion on drive whees. Pt negotiated 24 steps (6" + 3") with B rails & min/mod assist. Pt required max cuing to attend to LUE, foot placement on step, and assist with weight shifting. Educated pt on compensatory pattern for first 12 steps & then pt self selected gait pattern for additional 12 steps with activity focusing on LLE strengthening & neuro re-ed. Pt frequently stated "I'm weak" and "I'm going to fall" with max cuing & reassurance during task. After task pt emotionally labile with husband present - husband exited session & pt then stated "I don't feel good" with regards to her stomach, but agreeable to continued participation. Gait training x 40 ft with RW & min assist with therapist facilitating weight shifting L/R. Pt with very minimal L hip/knee flexion & ankle dorsiflexion with absent foot clearance. Educated pt on need to trial AFO but due to inappropriate footwear, unable  to on this date. Requested pt have husband bring her in some tennis shoes. Pt utilized nu-step level 1 x 10 minutes with L hand piece with pt reporting 5/10 difficulty with task. Task focused on coordination of reciprocal movements & endurance training. Transported pt back to room & requested husband bring in tennis shoes but he reports they are here in pt's closet. Pt would benefit from AFO trial in near future. Pt assisted to recliner via stand pivot with min assist. Pt left in recliner with call bell within reach, BLE elevated, QRB donned & husband present.   Therapy Documentation Precautions:  Precautions Precautions: Fall Restrictions Weight Bearing Restrictions: No   See Function Navigator for Current Functional Status.   Therapy/Group: Individual Therapy  Sandi MariscalVictoria M Lenton Gendreau 05/21/2016, 5:19 PM

## 2016-05-21 NOTE — Plan of Care (Signed)
Problem: RH BLADDER ELIMINATION Goal: RH STG MANAGE BLADDER WITH ASSISTANCE STG Manage Bladder With minimal assist  Outcome: Not Progressing Bedpan used for bladder; encouraged use of toilet.

## 2016-05-21 NOTE — Progress Notes (Signed)
Hypoglycemic Event  CBG: 51  Treatment: 15 GM carbohydrate snack  Symptoms: None  Follow-up CBG: Time:1834 CBG Result:94  Possible Reasons for Event:  Morning dose too high or possibly spread out Novilin to 12 hours. Comments/MD notified: Recheck 2694 after dinner    Scot JunKaren E Janice Seales

## 2016-05-21 NOTE — Plan of Care (Signed)
Problem: RH COGNITION-NURSING Goal: RH STG USES MEMORY AIDS/STRATEGIES W/ASSIST TO PROBLEM SOLVE STG Uses Memory Aids/Strategies With  Min Assistance to Problem Solve.  Outcome: Not Progressing Pt requires cuing for memory strategies with min assistance  Goal: RH STG ANTICIPATES NEEDS/CALLS FOR ASSIST W/ASSIST/CUES STG Anticipates Needs/Calls for Assist With  Min Assistance/Cues.  Outcome: Not Progressing Pt requires frequent cuing for call bell use.    Problem: RH PAIN MANAGEMENT Goal: RH STG PAIN MANAGED AT OR BELOW PT'S PAIN GOAL Less than or equal to 2   Outcome: Not Progressing Pt pain is managed at a 4 at this time with PRN pain medication provided  Problem: RH KNOWLEDGE DEFICIT Goal: RH STG INCREASE KNOWLEDGE OF DIABETES Outcome: Progressing Continued teaching to increase diabetes knowledge

## 2016-05-22 ENCOUNTER — Inpatient Hospital Stay (HOSPITAL_COMMUNITY): Payer: Medicare Other | Admitting: Physical Therapy

## 2016-05-22 ENCOUNTER — Inpatient Hospital Stay (HOSPITAL_COMMUNITY): Payer: Medicare Other | Admitting: Occupational Therapy

## 2016-05-22 ENCOUNTER — Inpatient Hospital Stay (HOSPITAL_COMMUNITY): Payer: Medicare Other | Admitting: Speech Pathology

## 2016-05-22 DIAGNOSIS — K5903 Drug induced constipation: Secondary | ICD-10-CM

## 2016-05-22 DIAGNOSIS — R52 Pain, unspecified: Secondary | ICD-10-CM

## 2016-05-22 DIAGNOSIS — E119 Type 2 diabetes mellitus without complications: Secondary | ICD-10-CM

## 2016-05-22 DIAGNOSIS — N179 Acute kidney failure, unspecified: Secondary | ICD-10-CM

## 2016-05-22 LAB — GLUCOSE, CAPILLARY
Glucose-Capillary: 113 mg/dL — ABNORMAL HIGH (ref 65–99)
Glucose-Capillary: 269 mg/dL — ABNORMAL HIGH (ref 65–99)
Glucose-Capillary: 55 mg/dL — ABNORMAL LOW (ref 65–99)
Glucose-Capillary: 73 mg/dL (ref 65–99)
Glucose-Capillary: 106 mg/dL — ABNORMAL HIGH (ref 65–99)

## 2016-05-22 MED ORDER — SENNOSIDES-DOCUSATE SODIUM 8.6-50 MG PO TABS
2.0000 | ORAL_TABLET | Freq: Every day | ORAL | Status: DC
  Administered 2016-05-22 – 2016-05-26 (×5): 2 via ORAL
  Filled 2016-05-22 (×5): qty 2

## 2016-05-22 MED ORDER — INSULIN ASPART 100 UNIT/ML ~~LOC~~ SOLN
0.0000 [IU] | Freq: Every day | SUBCUTANEOUS | Status: DC
  Administered 2016-05-23: 3 [IU] via SUBCUTANEOUS

## 2016-05-22 MED ORDER — POLYETHYLENE GLYCOL 3350 17 G PO PACK
17.0000 g | PACK | Freq: Every day | ORAL | Status: DC
  Administered 2016-05-22 – 2016-05-27 (×6): 17 g via ORAL
  Filled 2016-05-22 (×6): qty 1

## 2016-05-22 MED ORDER — INSULIN ASPART 100 UNIT/ML ~~LOC~~ SOLN
0.0000 [IU] | Freq: Three times a day (TID) | SUBCUTANEOUS | Status: DC
  Administered 2016-05-23: 8 [IU] via SUBCUTANEOUS
  Administered 2016-05-23: 5 [IU] via SUBCUTANEOUS
  Administered 2016-05-24: 11 [IU] via SUBCUTANEOUS
  Administered 2016-05-24: 10 [IU] via SUBCUTANEOUS
  Administered 2016-05-24: 11 [IU] via SUBCUTANEOUS

## 2016-05-22 MED ORDER — INSULIN ASPART 100 UNIT/ML ~~LOC~~ SOLN
5.0000 [IU] | Freq: Every day | SUBCUTANEOUS | Status: DC
  Administered 2016-05-23: 5 [IU] via SUBCUTANEOUS

## 2016-05-22 NOTE — Progress Notes (Signed)
Physical Therapy Session Note  Patient Details  Name: Chloe Owens MRN: [id number] Date of Birth: 1956-01-04  Today's Date: 05/22/2016 PT Individual Time: 0830-0930 PT Individual Time Calculation (min): 60 min     Therapy Documentation Precautions:  Precautions Precautions: Fall Restrictions Weight Bearing Restrictions: No   Pain: Pain Assessment Pain Assessment: 0-10 Pain Score: 5  Pain Location: Flank Pain Orientation: Right Pain Descriptors / Indicators: Aching Patients Stated Pain Goal: 2 Pain Intervention(s): Medication (See eMAR)    Patient received supine in bed.   Patient performed lower body dressing for pants hospital brief already donned on arrival. Patient willing to have female assist with lower body dressing. Mod assist overall  Supine to short sit edge of bed min assist with use of bed rail.   Stand pivot transfer from bed to wheelchair min assist. Tactile cues for weight shift to the left. Verbal cues for proper hand placement.  Sit to and from stand transfer with RW close supervision  Patient ambulated 100 feet with RW with left built up hand min assist. Patient ambulated with a step through pattern. Manual facilitation provided for weight shifting, pelvis rotation and for improved left foot clearance.  Patient ambulated 50 feetx2 with RW with left built up hand min assist. Therapist trailed theraband proprioceptive wrap in order to improve hip flexion, knee flexion and dorsiflexion on left. Patient demonstrates improvements with foot clearance however continues to requires similar assistance.   Patient up and down 12 steps with right handrail and left handheld assist; min asssit. Patient performed stairs with a step to pattern. Verbal cues throughout for proper sequence and technque.  Nu-Step level 5 8 minutes.   Block practice sit to and from stand transfer without assistive device focusing on even weight distribution and increased power during  transfer. Patient had arms across chest during activity.  Occasional min assist for balance.   Patient tolerated tx session well with rest breaks provided throughout session. Patient returned to room at end of session with all needs met resting comfortably in recliner chair with quick release belt donned.      See Function Navigator for Current Functional Status.   Therapy/Group: Individual Therapy  Retta Diones 05/22/2016, 9:33 AM

## 2016-05-22 NOTE — Plan of Care (Signed)
Problem: RH COGNITION-NURSING Goal: RH STG ANTICIPATES NEEDS/CALLS FOR ASSIST W/ASSIST/CUES STG Anticipates Needs/Calls for Assist With  Min Assistance/Cues.  Outcome: Not Progressing Doesn't use call light.

## 2016-05-22 NOTE — Significant Event (Signed)
Hypoglycemic Event  CBG: 55  Treatment: 15 GM carbohydrate snack  Symptoms: None  Follow-up CBG: Time:   CBG Result:73  Possible Reasons for Event: Medication regimen: meal coverage/ssi at lunch  Comments/MD notified:PA notified of event    Pamelia HoitSharp, Rayaan Lorah B

## 2016-05-22 NOTE — Progress Notes (Signed)
Occupational Therapy Session Note  Patient Details  Name: Chloe Owens MRN: [id number] Date of Birth: Dec 29, 1955  Today's Date: 05/22/2016 OT Individual Time: [id number] OT Individual Time Calculation (min): 45 min     Short Term Goals:Week 2:  OT Short Term Goal 1 (Week 2): STG=LTG due to ELOS  Skilled Therapeutic Interventions/Progress Updates:    Pt seen for OT ADL bathing/dressing session. Pt sitting up in recliner upon arrival with husband present (he didn't stay for session). Pt voicing increased pain in bottom due to prolonged sitting. With encouragement pt willing to participate in bathing at shower level. Ambulated into bathroom with CGA and VCs for R/L weightshift to advance L LE and assist for RW management.  She bathed seated on tub transfer bench with close supervision. She stood with use of grab bars with CGA to complete buttock hygiene. She required VCs throughout session for incorporation/ problem solving of using L UE in functional manner to assist with bathing. Pt very self-limiting and often saying "I can't do it" or asking for assist prior to initiating task. Cont to educate and encourage regarding importance of independence with self care task and importance of use of L UE. She dressed seated in w/c, requiring assist for hemi dressing technique. [facility] up, without UE support, pt with L lean, requiring min-mod A to regain balance and VCs for awareness.  Pt left seated at sink with husband present assisting with grooming at end of session.   Therapy Documentation Precautions:  Precautions Precautions: Fall Restrictions Weight Bearing Restrictions: No  See Function Navigator for Current Functional Status.   Therapy/Group: Individual Therapy  Lewis, Marlyn Tondreau C 05/22/2016, 7:23 AM

## 2016-05-22 NOTE — Progress Notes (Signed)
Speech Language Pathology Discharge Summary & Final Treatment Note  Patient Details  Name: Chloe Owens MRN: [id number] Date of Birth: November 22, 1955  Today's Date: 05/22/2016 SLP Individual Time: 1515-1600 SLP Individual Time Calculation (min): 45 min    Skilled Therapeutic Interventions:   Skilled treatment session focused on addressing speech and swallow goals. SLP facilitated session by providing set-up of regular textures and thin liquids which patient consumed with extra time for use of safe swallow strategies and effective oral clearance.  Patient with report of pain and RN present to administer pain medication, which patient was observed to take whole with thin liquid sip with no overt s/s of aspiration.  Recommend patient receive intermittent supervision with current diet and upgrade to meds whole, one at a time with thin; orders also modified.  SLP also facilitated session with discussion regarding home management tasks and modifications that will be needed upon discharge; patient 100% intelligible at the sentence level in a mildly distracting environment Mod I.  Goals met this session and education completed.  As a result, no further skilled SLP services are warranted at this time.    Patient has met 2 of 2 long term goals.  Patient to discharge at overall Modified Independent level.  Reasons goals not met: n/a   Clinical Impression/Discharge Summary:    Patient has made functional gains during this rehab admission and has met 2 out of 2 long term goals due to improved speech and swallow abilities.  Patient is currently Mod I for verbal expression intelligibility at the sentence level and Mod I for utilization of swallowing compensatory strategies to minimize overt s/s of aspiration with regular textures and thin liquids.  Patient and family education has been completed and patient will discharge home with necessary assist. Patient does not require any additional SLP follow up due to being  Mod I for above mentioned goals and at baseline for cogniton per spouse's report.    Care Partner:  Caregiver Able to Provide Assistance: Other (comment) (goals are Mod I for speech and swallowing and spouse reports cognition to be baseline )  Type of Caregiver Assistance: Cognitive  Recommendation:  None;Other (comment) (Intermittent supervision )  Rationale for SLP Follow Up:  (n/a)   Equipment: none   Reasons for discharge: Treatment goals met   Patient/Family Agrees with Progress Made and Goals Achieved: Yes   Function:  Eating Eating   Modified Consistency Diet: No Eating Assist Level: More than reasonable amount of time;Swallowing techniques: self managed;Set up assist for   Eating Set Up Assist For: Opening containers       Cognition Comprehension Comprehension assist level: Follows basic conversation/direction with extra time/assistive device  Expression   Expression assist level: Expresses basic needs/ideas: With extra time/assistive device  Social Interaction Social Interaction assist level: Interacts appropriately 90% of the time - Needs monitoring or encouragement for participation or interaction.  Problem Solving Problem solving assist level: Solves basic 90% of the time/requires cueing < 10% of the time  Memory Memory assist level: Recognizes or recalls 75 - 89% of the time/requires cueing 10 - 24% of the time   Carmelia Roller., CCC-SLP [phone]  Shemicka Cohrs 05/22/2016, 4:17 PM

## 2016-05-22 NOTE — Progress Notes (Signed)
Physical Therapy Weekly Progress Note  Patient Details  Name: Chloe Owens MRN: [id number] Date of Birth: November 09, 1955  Beginning of progress report period: May 12, 2016 End of progress report period: May 22, 2016  Today's Date: 05/22/2016 PT Individual Time: 1300-1400 PT Individual Time Calculation (min): 60 min    Patient has met 4 of 4 short term goals.  Pt currently requires minA overall for transfers, ambulation with RW and stairs. Pt limited by L inattention, LLE strength/coordination deficits, impaired attention/memory/recall, decreased frustration tolerance. Pt with significant L lateral lean in standing increasing risk of falls, and decreased stepping/righting reactions on L side to assist with preventing falls.   Patient continues to demonstrate the following deficits muscle weakness, unbalanced muscle activation, decreased coordination and decreased motor planning, decreased attention to left and decreased motor planning, decreased initiation, decreased attention, decreased awareness, decreased problem solving, decreased safety awareness, decreased memory and delayed processing and decreased sitting balance, decreased standing balance, decreased postural control, hemiplegia and decreased balance strategies and therefore will continue to benefit from skilled PT intervention to increase functional independence with mobility.  Patient progressing toward long term goals..  Continue plan of care.  PT Short Term Goals Week 1:  PT Short Term Goal 1 (Week 1): Pt will ambulate 50 ft with min A x 1. PT Short Term Goal 1 - Progress (Week 1): Met PT Short Term Goal 2 (Week 1): Pt will perform sit to stand and mat to chair transfer with min A x 1 PT Short Term Goal 2 - Progress (Week 1): Met PT Short Term Goal 3 (Week 1): Pt will perform supine to sit and sit to supine wiht min A PT Short Term Goal 3 - Progress (Week 1): Met PT Short Term Goal 4 (Week 1): Pt will ascend and descend 2  stairs with mod A. PT Short Term Goal 4 - Progress (Week 1): Met Week 2:  PT Short Term Goal 1 (Week 2): =LTG due to estimated LOS  Skilled Therapeutic Interventions/Progress Updates:   Pt received seated in w/c, denies pain and agreeable to treatment. Performed ascent/descent of four 6-inch stairs with B handrails and min guard, self-selecting reciprocal ascent and step-to descent. Requires cues for attention to LUE to fully place on handrails. When completing trial pt reports she needs to use the restroom and then impulsively attempts to sit in w/c before completing turn to chair or removing LUE from handrail and has LOB requiring mod/maxA to return to safely sit in w/c. Stand pivot transfer w/c <>toilet with minA. Pt performed hygiene without assist, but requires assist for managing clothing due to LUE inattention, L lateral lean and occasional LOBs. Standing balance with LLE on 1" step, RUE reaching to R side to retrieve cards and match to board in front of pt; performed for R lateral weight shifting for carryover into gait, visual scanning and attention. Gait 2x50' with LLE theraband proprioception wrapping to facilitate LLE clearance and normalized gait pattern; required min guard overall when ambulating in straight trajectory. Performed additional trial navigating around cones to assess gait when changing directions, and pt with several LOBs due to L inattention, L foot drag and absent righting reactions, requiring modA overall to provide cueing and assist with R weight shift, LLE progression to prevent LOB. Returned to room and remained seated in recliner at end of session, quick release belt intact and all needs in reach.   Therapy Documentation Precautions:  Precautions Precautions: Fall Restrictions Weight Bearing Restrictions: No  See Function Navigator for Current Functional Status.  Therapy/Group: Individual Therapy  Luberta Mutter 05/22/2016, 2:21 PM

## 2016-05-22 NOTE — Progress Notes (Signed)
[place] PHYSICAL MEDICINE & REHABILITATION     PROGRESS NOTE    Subjective/Complaints: Pt seen laying in bed this AM. She slept well overnight and overall, had a good weekend.    ROS: Denies CP, SOB, N/V/D.  Objective: Vital Signs: Blood pressure 103/60, pulse 73, temperature 98 F (36.7 C), temperature source Oral, resp. rate 16, height 5\' 8"  (1.727 m), weight 77.2 kg (170 lb 3.1 oz), SpO2 96 %. No results found. No results for input(s): WBC, HGB, HCT, PLT in the last 72 hours. No results for input(s): NA, K, CL, GLUCOSE, BUN, CREATININE, CALCIUM in the last 72 hours.  Invalid input(s): CO CBG (last 3)   Recent Labs  05/21/16 1833 05/21/16 2038 05/22/16 0651  GLUCAP 94 163* 113*    Wt Readings from Last 3 Encounters:  05/17/16 77.2 kg (170 lb 3.1 oz)  05/23/14 72.6 kg (160 lb)  05/09/12 82.1 kg (181 lb)    Physical Exam:  Constitutional: NAD. Vital signs reviewed. HENT: Normocephalic and atraumatic.  Eyes: No discharge.  Cardiovascular: RRR. No JVD. Respiratory: clear. Unlabored GI: Soft. Bowel sounds are normal.  Musculoskeletal: She exhibits no edema or tenderness.  Neurological: She is alert and oriented.  Left facial weakness/central 7.  LUQ anopsia persistent Motor: RUE/RLE: 4/5 proximal to distal (stronger than left side) LUE/LLE: 4/5 proximal to distal Sensation intact to light touch   Skin: Skin is warm and dry. No sacral breakdown Psychiatric: Her affect is flat Her speech is delayed.    Assessment/Plan: 1. Functional, cognitive, and mobility deficits secondary to right MCA infarct which require 3+ hours per day of interdisciplinary therapy in a comprehensive inpatient rehab setting. Physiatrist is providing close team supervision and 24 hour management of active medical problems listed below. Physiatrist and rehab team continue to assess barriers to discharge/monitor patient progress toward functional and medical  goals.  Function:  Bathing Bathing position   Position: Wheelchair/chair at sink  Bathing parts Body parts bathed by patient: Right arm, Chest, Abdomen, Right upper leg, Left upper leg, Left arm, Right lower leg, Left lower leg, Front perineal area, Buttocks Body parts bathed by helper: Back  Bathing assist Assist Level: Touching or steadying assistance(Pt > 75%)      Upper Body Dressing/Undressing Upper body dressing   What is the patient wearing?: Pull over shirt/dress     Pull over shirt/dress - Perfomed by patient: Thread/unthread right sleeve, Put head through opening, Thread/unthread left sleeve, Pull shirt over trunk Pull over shirt/dress - Perfomed by helper: Pull shirt over trunk        Upper body assist Assist Level: Set up, Supervision or verbal cues   Set up : To obtain clothing/put away  Lower Body Dressing/Undressing Lower body dressing   What is the patient wearing?: Underwear, Pants, Shoes Underwear - Performed by patient: Thread/unthread right underwear leg, Thread/unthread left underwear leg, Pull underwear up/down Underwear - Performed by helper: Pull underwear up/down Pants- Performed by patient: Thread/unthread right pants leg, Thread/unthread left pants leg, Pull pants up/down Pants- Performed by helper: Pull pants up/down Non-skid slipper socks- Performed by patient: Don/doff right sock, Don/doff left sock Non-skid slipper socks- Performed by helper: Don/doff right sock, Don/doff left sock     Shoes - Performed by patient: Don/doff right shoe, Don/doff left shoe            Lower body assist Assist for lower body dressing: Touching or steadying assistance (Pt > 75%)      Financial traderToileting Toileting Toileting  activity did not occur: Refused Toileting steps completed by patient: Performs perineal hygiene, Adjust clothing prior to toileting Toileting steps completed by helper: Adjust clothing prior to toileting, Performs perineal hygiene, Adjust clothing  after toileting Toileting Assistive Devices: Toilet aid  Toileting assist Assist level: Touching or steadying assistance (Pt.75%)   Transfers Chair/bed transfer   Chair/bed transfer method: Stand pivot Chair/bed transfer assist level: Touching or steadying assistance (Pt > 75%) Chair/bed transfer assistive device: Armrests     Locomotion Ambulation     Max distance: 40 ft Assist level: Touching or steadying assistance (Pt > 75%)   Wheelchair   Type: Manual Max wheelchair distance: 50 ft Assist Level: Touching or steadying assistance (Pt > 75%)  Cognition Comprehension Comprehension assist level: Follows basic conversation/direction with no assist  Expression Expression assist level: Expresses basic needs/ideas: With extra time/assistive device  Social Interaction Social Interaction assist level: Interacts appropriately 75 - 89% of the time - Needs redirection for appropriate language or to initiate interaction.  Problem Solving Problem solving assist level: Solves basic 75 - 89% of the time/requires cueing 10 - 24% of the time  Memory Memory assist level: Recognizes or recalls 75 - 89% of the time/requires cueing 10 - 24% of the time   Medical Problem List and Plan: 1.  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  -continue CIR  2.  DVT Prophylaxis/Anticoagulation: Pharmaceutical: Lovenox 3. Pain Management:   4. Mood: LCSW to follow for evaluation and support.  5. Neuropsych: This patient is not fully capable of making decisions on her own behalf. 6. Skin/Wound Care: routine pressure relief measures.  7. Fluids/Electrolytes/Nutrition: Monitor I/O. 8. HTN:  Lisinopril on hold with control at present  9. T2DM:    Had been on Lantus 18 units daily with SSI for elevated BS  Cont nph, novolog with breakfast and lunch   holding tradjenta for now  Labile, but appear to be improving   CBG (last 3)   Recent Labs  05/21/16 1833  05/21/16 2038 05/22/16 0651  GLUCAP 94 163* 113*  10 Anxiety/depression: team to provide ego support. She was on Abilify, Klonopin and Wellbutrin at home.   Klonopin resumed.     Rresumed low dose Wellbutrin..   continue prn xanax for breakthrough anxiety   11. Dyslipidemia: On Lipitor.    12. Nausea: improved  continue prilosec  Anxiety may be a component.    -ua equivocal--ucx with multispecies 13. AKI  Encourage intake  Labs ordered for tomorrow 14. Constipation  Bowel reg increased 12/18   LOS (Days) 12 A FACE TO FACE EVALUATION WAS PERFORMED  Ankit Karis Jubanil Patel 05/22/2016 8:52 AM

## 2016-05-23 ENCOUNTER — Inpatient Hospital Stay (HOSPITAL_COMMUNITY): Payer: Medicare Other | Admitting: Physical Therapy

## 2016-05-23 ENCOUNTER — Inpatient Hospital Stay (HOSPITAL_COMMUNITY): Payer: Medicare Other | Admitting: Occupational Therapy

## 2016-05-23 ENCOUNTER — Inpatient Hospital Stay (HOSPITAL_COMMUNITY): Payer: Medicare Other | Admitting: Speech Pathology

## 2016-05-23 DIAGNOSIS — D62 Acute posthemorrhagic anemia: Secondary | ICD-10-CM | POA: Insufficient documentation

## 2016-05-23 DIAGNOSIS — R7309 Other abnormal glucose: Secondary | ICD-10-CM | POA: Insufficient documentation

## 2016-05-23 LAB — CBC WITH DIFFERENTIAL/PLATELET
Basophils Absolute: 0 10*3/uL (ref 0.0–0.1)
Basophils Relative: 0 %
Eosinophils Absolute: 0.2 10*3/uL (ref 0.0–0.7)
Eosinophils Relative: 3 %
HCT: 35.5 % — ABNORMAL LOW (ref 36.0–46.0)
Hemoglobin: 11.9 g/dL — ABNORMAL LOW (ref 12.0–15.0)
Lymphocytes Relative: 37 %
Lymphs Abs: 2.8 10*3/uL (ref 0.7–4.0)
MCH: 29.3 pg (ref 26.0–34.0)
MCHC: 33.5 g/dL (ref 30.0–36.0)
MCV: 87.4 fL (ref 78.0–100.0)
Monocytes Absolute: 0.6 10*3/uL (ref 0.1–1.0)
Monocytes Relative: 8 %
Neutro Abs: 4.1 10*3/uL (ref 1.7–7.7)
Neutrophils Relative %: 52 %
Platelets: 262 10*3/uL (ref 150–400)
RBC: 4.06 MIL/uL (ref 3.87–5.11)
RDW: 13 % (ref 11.5–15.5)
WBC: 7.7 10*3/uL (ref 4.0–10.5)

## 2016-05-23 LAB — BASIC METABOLIC PANEL
Anion gap: 6 (ref 5–15)
BUN: 13 mg/dL (ref 6–20)
CO2: 30 mmol/L (ref 22–32)
Calcium: 9.1 mg/dL (ref 8.9–10.3)
Chloride: 101 mmol/L (ref 101–111)
Creatinine, Ser: 1.03 mg/dL — ABNORMAL HIGH (ref 0.44–1.00)
GFR calc Af Amer: >60 mL/min (ref >60)
GFR calc non Af Amer: 57 mL/min — ABNORMAL LOW (ref >60)
Glucose, Bld: 191 mg/dL — ABNORMAL HIGH (ref 65–99)
Potassium: 3.9 mmol/L (ref 3.5–5.1)
Sodium: 137 mmol/L (ref 135–145)

## 2016-05-23 LAB — GLUCOSE, CAPILLARY
Glucose-Capillary: 226 mg/dL — ABNORMAL HIGH (ref 65–99)
Glucose-Capillary: 252 mg/dL — ABNORMAL HIGH (ref 65–99)
Glucose-Capillary: 41 mg/dL — CL (ref 65–99)
Glucose-Capillary: 66 mg/dL (ref 65–99)
Glucose-Capillary: 294 mg/dL — ABNORMAL HIGH (ref 65–99)

## 2016-05-23 MED ORDER — INSULIN GLARGINE 100 UNIT/ML ~~LOC~~ SOLN
10.0000 [IU] | Freq: Every day | SUBCUTANEOUS | Status: DC
  Administered 2016-05-23: 10 [IU] via SUBCUTANEOUS
  Filled 2016-05-23: qty 0.1

## 2016-05-23 MED ORDER — INSULIN ASPART 100 UNIT/ML ~~LOC~~ SOLN
5.0000 [IU] | Freq: Three times a day (TID) | SUBCUTANEOUS | Status: DC
  Administered 2016-05-23: 5 [IU] via SUBCUTANEOUS

## 2016-05-23 MED ORDER — INSULIN NPH (HUMAN) (ISOPHANE) 100 UNIT/ML ~~LOC~~ SUSP
10.0000 [IU] | Freq: Two times a day (BID) | SUBCUTANEOUS | Status: DC
  Filled 2016-05-23: qty 10

## 2016-05-23 MED ORDER — ARIPIPRAZOLE 5 MG PO TABS
15.0000 mg | ORAL_TABLET | Freq: Every day | ORAL | Status: DC
  Administered 2016-05-23 – 2016-05-26 (×4): 15 mg via ORAL
  Filled 2016-05-23 (×4): qty 3

## 2016-05-23 MED ORDER — INSULIN ASPART 100 UNIT/ML ~~LOC~~ SOLN
5.0000 [IU] | Freq: Every day | SUBCUTANEOUS | Status: DC
  Administered 2016-05-24 – 2016-05-25 (×2): 5 [IU] via SUBCUTANEOUS

## 2016-05-23 NOTE — Progress Notes (Signed)
Occupational Therapy Session Note  Patient Details  Name: Chloe Owens MRN: [id number] Date of Birth: January 02, 1956  Today's Date: 05/23/2016 OT Individual Time: 1100-1200 OT Individual Time Calculation (min): 60 min     Short Term Goals:Week 2:  OT Short Term Goal 1 (Week 2): STG=LTG due to ELOS  Skilled Therapeutic Interventions/Progress Updates:    Pt seen for OT session focusing on functional transfers, L neuro re-ed with emphasis on fine motor control and weightshift. Pt sittting up in w/c upon arrival, agreeable to tx session with encouragement.  In ADL apartment, pt ambulated with min A and with mod VCs for foot placement and weight shift, especially during turns to walk into bathroom. She completed simulated tub/shower transfer with min A using pants to assist with pulling LEs over wall.  Neuro re-ed: Completed cup stacking activity using L UE. Required significantly increased time, but with encouragement pt demonstrated ability to manipulate cup and place on opposite side of table. Min A required for elbow extension to reach cup placed further away.  Utilized high/low table in standing for weightbearing through L UE and L LE during weight shift for table top activity.  Completed x3 trials with seated rest breaks provided btwn trials. Pt cont to require increased time and encouragement with all tasks due to self limiting behavior and decreased initiation. Pt returned to room at end of session, reviewed need for call bell and assist with mobility. Encouraged pt to stay up in w/c for lunch. QRB donned and all needs in reach.     Therapy Documentation Precautions:  Precautions Precautions: Fall Restrictions Weight Bearing Restrictions: No  See Function Navigator for Current Functional Status.   Therapy/Group: Individual Therapy  Lewis, Kary Sugrue C 05/23/2016, 7:09 AM

## 2016-05-23 NOTE — Progress Notes (Signed)
Brief note:  Please see nursing note and previous note:  Extremely labile CBGs with hypoglycemia and hyperglycemic episodes.  Insulin further adjusted.  Maryla MorrowAnkit Patel, MD, [place] DomFAAPMR

## 2016-05-23 NOTE — Progress Notes (Signed)
Physical Therapy Session Note  Patient Details  Name: Chloe Owens MRN: [id number] Date of Birth: Nov 12, 1955  Today's Date: 05/23/2016 PT Individual Time: 1300-1400 PT Individual Time Calculation (min): 60 min    Short Term Goals: Week 2:  PT Short Term Goal 1 (Week 2): =LTG due to estimated LOS  Skilled Therapeutic Interventions/Progress Updates:   Pt received seated in w/c, c/o pain as below and agreeable to treatment. Gait 3x100' with RW and minA. First trial performed with RW only, second trial RW and LLE PLS AFO with no improvement of LLE clearance, third trial with theraband proprioception wrap with improved foot clearance and facilitation at pelvis for posterior pelvic tilt with significant improvement. Continues to demonstrate L lateral lean and poor stepping strategies on L side when changing directions or turning to sit in chair on L side. Educated pt on purpose of theraband to improve awareness and LE positioning during ambulation. Stand pivot transfer w/c <>bed in ADL apartment with RW and min guard; improved foot clearance on carpet and with cues to slow down. Returned to room with gait 2x115' with min guard and much improved LLE clearance and self-initiating posterior pelvic tilt. Remained seated in w/c at end of session, all needs in reach.   Therapy Documentation Precautions:  Precautions Precautions: Fall Restrictions Weight Bearing Restrictions: No Pain: Pain Assessment Pain Assessment: 0-10 Pain Score: 3  Pain Type: Acute pain Pain Location: Flank Pain Orientation: Right   See Function Navigator for Current Functional Status.   Therapy/Group: Individual Therapy  Vista Lawmanlizabeth J Tygielski 05/23/2016, 1:45 PM

## 2016-05-23 NOTE — Progress Notes (Signed)
Occupational Therapy Session Note  Patient Details  Name: Chloe Owens MRN: [id number] Date of Birth: [birth year]  Today's Date: 05/23/2016 OT Individual Time: 7023-0172 OT Individual Time Calculation (min): 55 min     Short Term Goals:Week 1:  OT Short Term Goal 1 (Week 1): Pt will demonstrate hemiplegic techniques for UB dressing with min A. OT Short Term Goal 1 - Progress (Week 1): Met OT Short Term Goal 2 (Week 1): Pt will demonstrate hemiplegic techniques for LB dressing with mod A in order to increase I in self care. OT Short Term Goal 2 - Progress (Week 1): Met OT Short Term Goal 3 (Week 1): Pt will perform toilet transfer with mod A to decrease level of assist with functional transfer. OT Short Term Goal 3 - Progress (Week 1): Met OT Short Term Goal 4 (Week 1): Pt will maintain standing balance with mod A during LB clothing management.  OT Short Term Goal 4 - Progress (Week 1): Met Week 2:  OT Short Term Goal 1 (Week 2): STG=LTG due to ELOS  Skilled Therapeutic Interventions/Progress Updates:    Pt seen this session with a focus on initiation, L side awareness, balance during ADL retraining. Pt received in bed and agreeable to a shower. She needed min A with supine to sit to fully scoot L hip to EOB, sit to stand, and ambulation along with mod cuing to fully attend to LLE as she would drag her L leg behind her. Pt stated she could not move her leg but when cued to look at it she could step it forward.  On tub bench pt bathed with steadying A and cues to wash all body parts.  Pt donned clothing from EOB with cues to orient the clothing and complete all steps. Steadying A with balance to pull pants over hips. She does need A with L shoes.  Pt transferred to w/c to complete grooming. In chair with safety belt on and call light in reach.  Therapy Documentation Precautions:  Precautions Precautions: Fall Restrictions Weight Bearing Restrictions: No   Pain: Pain Assessment Pain  Assessment: 0-10 Pain Score: 5  Pain Type: Acute pain Pain Location: Flank Pain Orientation: Right Pain Descriptors / Indicators: Aching Pain Frequency: Intermittent Pain Onset: On-going Patients Stated Pain Goal: 3 Pain Intervention(s): Medication (See eMAR) ADL:    See Function Navigator for Current Functional Status.   Therapy/Group: Individual Therapy  SAGUIER,JULIA 05/23/2016, 9:59 AM

## 2016-05-23 NOTE — Progress Notes (Signed)
[place] PHYSICAL MEDICINE & REHABILITATION     PROGRESS NOTE    Subjective/Complaints: Pt seen laying in bed this AM.  She states she slept well overnight.  She is worried that her right side is going to start hurting, but is not in any pain at present.    ROS: Denies CP, SOB, N/V/D.  Objective: Vital Signs: Blood pressure 139/67, pulse 88, temperature 98.5 F (36.9 C), temperature source Oral, resp. rate 18, height 5\' 8"  (1.727 m), weight 77.1 kg (169 lb 15.6 oz), SpO2 97 %. No results found.  Recent Labs  05/23/16 0457  WBC 7.7  HGB 11.9*  HCT 35.5*  PLT 262    Recent Labs  05/23/16 0457  NA 137  K 3.9  CL 101  GLUCOSE 191*  BUN 13  CREATININE 1.03*  CALCIUM 9.1   CBG (last 3)   Recent Labs  05/22/16 1646 05/22/16 2057 05/23/16 0639  GLUCAP 73 106* 226*    Wt Readings from Last 3 Encounters:  05/22/16 77.1 kg (169 lb 15.6 oz)  05/23/14 72.6 kg (160 lb)  05/09/12 82.1 kg (181 lb)    Physical Exam:  Constitutional: NAD. Vital signs reviewed. HENT: Normocephalic and atraumatic.  Eyes: No discharge.  Cardiovascular: RRR. No JVD. Respiratory: clear. Unlabored GI: Soft. Bowel sounds are normal.  Musculoskeletal: She exhibits no edema or tenderness.  Neurological: She is alert and oriented x2.  Left facial weakness/central 7.  Motor: RUE/RLE: 4/5 proximal to distal (stronger than left side) LUE/LLE: 4/5 proximal to distal, LUE tremor.   Sensation intact to light touch   Skin: Skin is warm and dry. No sacral breakdown Psychiatric: Her affect is flat Her speech is delayed.    Assessment/Plan: 1. Functional, cognitive, and mobility deficits secondary to right MCA infarct which require 3+ hours per day of interdisciplinary therapy in a comprehensive inpatient rehab setting. Physiatrist is providing close team supervision and 24 hour management of active medical problems listed below. Physiatrist and rehab team continue to assess barriers to  discharge/monitor patient progress toward functional and medical goals.  Function:  Bathing Bathing position   Position: Shower  Bathing parts Body parts bathed by patient: Right arm, Chest, Abdomen, Right upper leg, Left upper leg, Left arm, Right lower leg, Left lower leg, Front perineal area, Buttocks Body parts bathed by helper: Back  Bathing assist Assist Level: Touching or steadying assistance(Pt > 75%)      Upper Body Dressing/Undressing Upper body dressing   What is the patient wearing?: Pull over shirt/dress     Pull over shirt/dress - Perfomed by patient: Thread/unthread right sleeve, Thread/unthread left sleeve, Put head through opening, Pull shirt over trunk Pull over shirt/dress - Perfomed by helper: Pull shirt over trunk        Upper body assist Assist Level: Set up, Supervision or verbal cues   Set up : To obtain clothing/put away  Lower Body Dressing/Undressing Lower body dressing   What is the patient wearing?: Pants, Shoes, Socks Underwear - Performed by patient: Thread/unthread right underwear leg, Thread/unthread left underwear leg, Pull underwear up/down Underwear - Performed by helper: Pull underwear up/down Pants- Performed by patient: Thread/unthread right pants leg, Thread/unthread left pants leg, Pull pants up/down Pants- Performed by helper: Pull pants up/down Non-skid slipper socks- Performed by patient: Don/doff right sock, Don/doff left sock Non-skid slipper socks- Performed by helper: Don/doff right sock, Don/doff left sock     Shoes - Performed by patient: Don/doff right shoe, Don/doff left shoe  Lower body assist Assist for lower body dressing: Touching or steadying assistance (Pt > 75%)      Toileting Toileting Toileting activity did not occur: Refused Toileting steps completed by patient: Performs perineal hygiene Toileting steps completed by helper: Adjust clothing prior to toileting, Adjust clothing after  toileting Toileting Assistive Devices: Toilet aid  Toileting assist Assist level: Touching or steadying assistance (Pt.75%)   Transfers Chair/bed transfer   Chair/bed transfer method: Stand pivot Chair/bed transfer assist level: Touching or steadying assistance (Pt > 75%) Chair/bed transfer assistive device: Armrests     Locomotion Ambulation     Max distance: 75 Assist level: Touching or steadying assistance (Pt > 75%)   Wheelchair   Type: Manual Max wheelchair distance: 50 ft Assist Level: Touching or steadying assistance (Pt > 75%)  Cognition Comprehension Comprehension assist level: Follows basic conversation/direction with extra time/assistive device  Expression Expression assist level: Expresses basic needs/ideas: With extra time/assistive device  Social Interaction Social Interaction assist level: Interacts appropriately 90% of the time - Needs monitoring or encouragement for participation or interaction.  Problem Solving Problem solving assist level: Solves basic 90% of the time/requires cueing < 10% of the time  Memory Memory assist level: Recognizes or recalls 75 - 89% of the time/requires cueing 10 - 24% of the time   Medical Problem List and Plan: 1.  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  -continue CIR  2.  DVT Prophylaxis/Anticoagulation: Pharmaceutical: Lovenox 3. Pain Management:   4. Mood: LCSW to follow for evaluation and support.  5. Neuropsych: This patient is not fully capable of making decisions on her own behalf. 6. Skin/Wound Care: routine pressure relief measures.  7. Fluids/Electrolytes/Nutrition: Monitor I/O. 8. HTN:  Lisinopril on hold with control 12/19  9. T2DM:    Had been on Lantus 18 units daily with SSI for elevated BS  Nph reduced to 10U BID  Novolog increased to 5 TID  holding tradjenta for now  Cont to monitor   CBG (last 3)   Recent Labs  05/22/16 1646 05/22/16 2057  05/23/16 0639  GLUCAP 73 106* 226*  10 Anxiety/depression: team to provide ego support. She was on Abilify, Klonopin and Wellbutrin at home.   Klonopin resumed.     Rresumed low dose Wellbutrin..   Abilify 15mg  qhs resumed on 12/19 due to increased lability per husband  continue prn xanax for breakthrough anxiety   11. Dyslipidemia: On Lipitor.    12. Nausea: improved  continue prilosec  Anxiety may be a component.    -ua equivocal--ucx with multispecies 13. AKI  Cr. 1.03 on 12/19  Encourage fluid intake 14. Constipation  Bowel reg increased 12/18  Improving 15. ABLA  Hb 11.9 on 12/19   Cont to monitor   LOS (Days) 13 A FACE TO FACE EVALUATION WAS PERFORMED  Ankit Karis Jubanil Patel 05/23/2016 9:19 AM

## 2016-05-23 NOTE — Progress Notes (Signed)
Inpatient Diabetes Program Recommendations  AACE/ADA: New Consensus Statement on Inpatient Glycemic Control (2015)  Target Ranges:  Prepandial:   less than 140 mg/dL      Peak postprandial:   less than 180 mg/dL (1-2 hours)      Critically ill patients:  140 - 180 mg/dL   Lab Results  Component Value Date   GLUCAP 226 (H) 05/23/2016   HGBA1C 8.2 (H) 05/07/2016    Review of Glycemic Control Results for Chloe Owens, Chloe Owens (MRN [id number]) as of 05/23/2016 08:35  Ref. Range 05/22/2016 11:21 05/22/2016 16:23 05/22/2016 16:46 05/22/2016 20:57 05/23/2016 06:39  Glucose-Capillary Latest Ref Range: 65 - 99 mg/dL 409269 (H) 55 (L) 73 811106 (H) 226 (H)    Diabetes history: Type 2 Outpatient Diabetes medications: Basaglar insulin 25 units qam, Januvia 100mg /day  Current orders for Inpatient glycemic control: Novolog 5 units with breakfast, Novolog moderate correction 0-15 units tid, Novolog 0-5 units qhs, NPH 16 units bid, Tradjenta 5mg  qam  Inpatient Diabetes Program Recommendations:  Consider d/c NPH insulin bid and switch the patient to Lantus insulin  25 units beginning tonight at bedtime tonight.    (Basaglar insulin has the same properties as Lantus and does not peak- NPH peaks late in the afternoon and this combined with the Novolog 5 units that was given at lunch yesterday, likely caused the low blood sugar yesterday afternoon.)  Thank you for the referral requesting our input on this patient's medications  Susette RacerJulie Treazure Nery, RN, [place], [place]HA, CDE Diabetes Coordinator Inpatient Diabetes Program  [phone] (Team Pager) [phone] [facility] Office) 05/23/2016 8:48 AM

## 2016-05-23 NOTE — Progress Notes (Signed)
Hypoglycemic Event  CBG: 41  Treatment: Dinner  Symptoms:None  Follow-up CBG: Time:1707 CBG Result:66  Possible Reasons for Event: Unknown  Comments/MD notified:Pamela Love PAC    Nakeia Calvi, Ashley MurrainLuz S

## 2016-05-23 NOTE — Progress Notes (Signed)
Occupational Therapy Session Note  Patient Details  Name: Aldona Barerry J Blomquist MRN: [id number] Date of Birth: 1955/07/02  Today's Date: 05/23/2016 OT Individual Time: 1530-1600 OT Individual Time Calculation (min): 30 min     Short Term Goals: Week 2:  OT Short Term Goal 1 (Week 2): STG=LTG due to ELOS  Skilled Therapeutic Interventions/Progress Updates:     Upon entering the room, pt seated in wheelchair with no c/o pain. Pt engaged in L UE finger isolation exercises, thumb circumduction, thumb adduction/abduction. Pt needing encouragement as she reports, "No, I can't do it." Pt reporting need for toileting. Pt ambulated with RW and min A into bathroom for BM. Pt able to perform hygiene and clothing management with steady assistance and increased time. Pt returning to bed at end of session with min A for balance and mod verbal cues for technique. Pt needing cues throughout session for L inattention. Bed alarm activated and call bell within reach upon exiting the room    Therapy Documentation Precautions:  Precautions Precautions: Fall Restrictions Weight Bearing Restrictions: No General:   Vital Signs: Therapy Vitals Temp: 98.6 F (37 C) Temp Source: Oral Pulse Rate: 94 Resp: 18 BP: 108/84 Patient Position (if appropriate): Sitting Oxygen Therapy SpO2: 98 % O2 Device: Not Delivered   See Function Navigator for Current Functional Status.   Therapy/Group: Individual Therapy  Alen BleacherBradsher, Dillon Mcreynolds P 05/23/2016, 4:20 PM

## 2016-05-23 NOTE — Plan of Care (Signed)
Problem: RH Balance Goal: LTG Patient will maintain dynamic standing with ADLs (OT) LTG:  Patient will maintain dynamic standing balance with assist during activities of daily living (OT)   Downgraded due to pt progress 12/19- AL  Problem: RH Dressing Goal: LTG Patient will perform upper body dressing (OT) LTG Patient will perform upper body dressing with assist, with/without cues (OT).  Downgraded due to pt progress 12/19- AL Goal: LTG Patient will perform lower body dressing w/assist (OT) LTG: Patient will perform lower body dressing with assist, with/without cues in positioning using equipment (OT)  Downgraded due to pt progress 12/19- AL  Problem: RH Toilet Transfers Goal: LTG Patient will perform toilet transfers w/assist (OT) LTG: Patient will perform toilet transfers with assist, with/without cues using equipment (OT)  Downgraded due to pt progress 12/19- AL  Problem: RH Tub/Shower Transfers Goal: LTG Patient will perform tub/shower transfers w/assist (OT) LTG: Patient will perform tub/shower transfers with assist, with/without cues using equipment (OT)  Downgraded due to pt progress 12/19- AL

## 2016-05-24 ENCOUNTER — Ambulatory Visit (HOSPITAL_COMMUNITY): Payer: Medicare Other | Admitting: *Deleted

## 2016-05-24 ENCOUNTER — Inpatient Hospital Stay (HOSPITAL_COMMUNITY): Payer: Medicare Other | Admitting: Occupational Therapy

## 2016-05-24 ENCOUNTER — Inpatient Hospital Stay (HOSPITAL_COMMUNITY): Payer: Medicare Other | Admitting: Physical Therapy

## 2016-05-24 DIAGNOSIS — D62 Acute posthemorrhagic anemia: Secondary | ICD-10-CM

## 2016-05-24 LAB — BASIC METABOLIC PANEL
Anion gap: 10 (ref 5–15)
Anion gap: 9 (ref 5–15)
BUN: 12 mg/dL (ref 6–20)
BUN: 14 mg/dL (ref 6–20)
CO2: 25 mmol/L (ref 22–32)
CO2: 25 mmol/L (ref 22–32)
Calcium: 9.3 mg/dL (ref 8.9–10.3)
Calcium: 9.9 mg/dL (ref 8.9–10.3)
Chloride: 101 mmol/L (ref 101–111)
Chloride: 102 mmol/L (ref 101–111)
Creatinine, Ser: 1.05 mg/dL — ABNORMAL HIGH (ref 0.44–1.00)
Creatinine, Ser: 1.19 mg/dL — ABNORMAL HIGH (ref 0.44–1.00)
GFR calc Af Amer: >60 mL/min (ref >60)
GFR calc Af Amer: 56 mL/min — ABNORMAL LOW (ref >60)
GFR calc non Af Amer: 56 mL/min — ABNORMAL LOW (ref >60)
GFR calc non Af Amer: 48 mL/min — ABNORMAL LOW (ref >60)
Glucose, Bld: 373 mg/dL — ABNORMAL HIGH (ref 65–99)
Glucose, Bld: 373 mg/dL — ABNORMAL HIGH (ref 65–99)
Potassium: 4.4 mmol/L (ref 3.5–5.1)
Potassium: 3.6 mmol/L (ref 3.5–5.1)
Sodium: 136 mmol/L (ref 135–145)
Sodium: 136 mmol/L (ref 135–145)

## 2016-05-24 LAB — CBC
HCT: 37.2 % (ref 36.0–46.0)
Hemoglobin: 12.6 g/dL (ref 12.0–15.0)
MCH: 29.4 pg (ref 26.0–34.0)
MCHC: 33.9 g/dL (ref 30.0–36.0)
MCV: 86.9 fL (ref 78.0–100.0)
Platelets: 253 10*3/uL (ref 150–400)
RBC: 4.28 MIL/uL (ref 3.87–5.11)
RDW: 12.8 % (ref 11.5–15.5)
WBC: 7.9 10*3/uL (ref 4.0–10.5)

## 2016-05-24 LAB — GLUCOSE, CAPILLARY
Glucose-Capillary: 340 mg/dL — ABNORMAL HIGH (ref 65–99)
Glucose-Capillary: 447 mg/dL — ABNORMAL HIGH (ref 65–99)
Glucose-Capillary: 351 mg/dL — ABNORMAL HIGH (ref 65–99)
Glucose-Capillary: 168 mg/dL — ABNORMAL HIGH (ref 65–99)
Glucose-Capillary: 97 mg/dL (ref 65–99)
Glucose-Capillary: 131 mg/dL — ABNORMAL HIGH (ref 65–99)
Glucose-Capillary: 298 mg/dL — ABNORMAL HIGH (ref 65–99)

## 2016-05-24 MED ORDER — INSULIN ASPART 100 UNIT/ML ~~LOC~~ SOLN
0.0000 [IU] | Freq: Three times a day (TID) | SUBCUTANEOUS | Status: DC
  Administered 2016-05-25 (×2): 1 [IU] via SUBCUTANEOUS
  Administered 2016-05-25: 5 [IU] via SUBCUTANEOUS
  Administered 2016-05-26: 15 [IU] via SUBCUTANEOUS
  Administered 2016-05-26: 5 [IU] via SUBCUTANEOUS

## 2016-05-24 MED ORDER — INSULIN ASPART 100 UNIT/ML ~~LOC~~ SOLN
0.0000 [IU] | Freq: Every day | SUBCUTANEOUS | Status: DC
  Administered 2016-05-24: 3 [IU] via SUBCUTANEOUS
  Administered 2016-05-26: 2 [IU] via SUBCUTANEOUS

## 2016-05-24 MED ORDER — INSULIN GLARGINE 100 UNIT/ML ~~LOC~~ SOLN
20.0000 [IU] | Freq: Two times a day (BID) | SUBCUTANEOUS | Status: DC
  Administered 2016-05-24 – 2016-05-25 (×3): 20 [IU] via SUBCUTANEOUS
  Filled 2016-05-24 (×4): qty 0.2

## 2016-05-24 NOTE — Progress Notes (Signed)
Inpatient Diabetes Program Recommendations  AACE/ADA: New Consensus Statement on Inpatient Glycemic Control (2015)  Target Ranges:  Prepandial:   less than 140 mg/dL      Peak postprandial:   less than 180 mg/dL (1-2 hours)      Critically ill patients:  140 - 180 mg/dL   Lab Results  Component Value Date   GLUCAP 168 (H) 05/24/2016   HGBA1C 8.2 (H) 05/07/2016    Review of Glycemic Control:  Results for Chloe Owens, Chloe Owens (MRN [id number]) as of 05/24/2016 14:14  Ref. Range 05/24/2016 06:42 05/24/2016 11:21 05/24/2016 12:16 05/24/2016 13:49  Glucose-Capillary Latest Ref Range: 65 - 99 mg/dL 284340 (H) 132447 (H) 440351 (H) 168 (H)   Inpatient Diabetes Program Recommendations:    Consider reducing Lantus to 25 units once daily (this was home dose).  Also consider changing Novolog meal coverage to 3 units tid with meals (hold if patient eats less than 50%).    Thanks, Beryl MeagerJenny Jenalyn Girdner, RN, BC-ADM Inpatient Diabetes Coordinator Pager [phone] (8a-5p)

## 2016-05-24 NOTE — Progress Notes (Signed)
Social Work Patient ID: Chloe Owens, female   DOB: [birth year], 60 y.o.   MRN: [id number]   Have reviewed team conference with pt and spouse.  Both feeling they are ready for d/c end of week.  Spouse will complete all family ed tomorrow.  Reviewed f/u therapies and DME.    Juanito Gonyer, LCSW

## 2016-05-24 NOTE — Patient Care Conference (Signed)
Inpatient RehabilitationTeam Conference and Plan of Care Update Date: 05/23/2016   Time: 9:30 am    Patient Name: Chloe Owens [facility]      Medical Record Number: [id number]  Date of Birth: 01-Mar-1956 Sex: Female         Room/Bed: 4M06C/4M06C-01 Payor Info: Payor: MEDICARE / Plan: MEDICARE PART A AND B / Product Type: *No Product type* /    Admitting Diagnosis: CVA  Admit Date/Time:  05/10/2016  3:59 PM Admission Comments: No comment available   Primary Diagnosis:  <principal problem not specified> Principal Problem: <principal problem not specified>  Patient Active Problem List   Diagnosis Date Noted  . Acute blood loss anemia   . Labile blood glucose   . Pain   . Diabetes mellitus type 2 in nonobese (HCC)   . AKI (acute kidney injury) ([place])   . Constipation due to pain medication   . Acute ischemic right MCA stroke ([place]) 05/10/2016  . Cognitive deficit, post-stroke   . Adjustment disorder with mixed anxiety and depressed mood   . Nausea without vomiting   . Hemiparesis of right nondominant side due to cerebral infarction ([place])   . Dysarthria, post-stroke   . Hemiparesis of left nondominant side due to cerebral infarction ([place])   . History of CVA with residual deficit   . Benign essential HTN   . Non-rheumatic mitral regurgitation   . Tobacco abuse   . Leukocytosis   . Dysphagia, post-stroke   . Depression 05/07/2016  . Generalized anxiety disorder 05/07/2016  . Ankle fracture, left 04/14/2012  . Fall due to stumbling 04/14/2012  . Hypoglycemia 04/14/2012  . Hypotension 04/14/2012  . Diabetes mellitus ([place]) 04/14/2012  . Hyperlipidemia 04/14/2012  . History of CVA (cerebrovascular accident) 04/14/2012    Expected Discharge Date: Expected Discharge Date: 05/27/16  Team Members Present: Physician leading conference: Dr. Delice Lesch Social Worker Present: Lennart Pall, LCSW Nurse Present: Rayetta Pigg, RN PT Present: Kem Parkinson, PT OT Present: Napoleon Form, OT SLP  Present: Weston Anna, SLP PPS Coordinator present : Daiva Nakayama, RN, CRRN     Current Status/Progress Goal Weekly Team Focus  Medical   Cognitive deficits, dysarthria, hemiparesis secondary to right MCA infarct with history of CVA  Improve mobility, comorbidities  See above   Bowel/Bladder   pt is continent of bowel/bladder; last bm 05/21/2016 with supp; stool softners/laxitives scheduled  independent   continue with plan of care   Swallow/Nutrition/ Hydration   Regular textures with thin liquids, Mod I   Mod I  Goals Met, D/C from SLP   ADL's   Min A UB/LB bathing/dressing; Min A transfers; min A standing balance  Suprevision overall  ADL re-training; family education; d/c planning   Mobility   modI bed mobility, min guard transfers, minA gait with RW  S overall, minA stairs- potentially downgrade to minA due to L inattention, impulsivity  Gait training, NMR, dynamic standing balance, activity tolerance and pain management   Communication   Mod I  Mod I  Goals Met, D/C from SLP    Safety/Cognition/ Behavioral Observations            Pain   c/o right flank pain 7/10; tramadol prn and lidoderm patch to area with relief  less than 2  continue with plan of care   Skin   skin is intact with bruising to bilateral abdomen  skin continues to remain intact  continue with plan of care    Rehab Goals Patient on target  to meet rehab goals: Yes *See Care Plan and progress notes for long and short-term goals.  Barriers to Discharge: safety awareness, left neglect, DM, HTN, anxiety, AKI    Possible Resolutions to Barriers:  therapies, family ed, optimize BP, DM, anxiety meds    Discharge Planning/Teaching Needs:  Plan to return home with spouse who can provide 24/7 assistance.  Teaching will be scheduled this week.   Team Discussion:  BSs still variable  - MD adjusting insulin regimen.  Addition of abilfy requested per spouse (on at home).  Currently min-mod assist overall but could  be more supervision if effort from pt was improved.  Min-mod with tfs, gait due to left inattention.  May need to downgrade goals to min assist.  ST has d/c'd as pt mod ind with swallow.  Revisions to Treatment Plan:  Likely downgrading of overall goals to min assist.   Continued Need for Acute Rehabilitation Level of Care: The patient requires daily medical management by a physician with specialized training in physical medicine and rehabilitation for the following conditions: Daily direction of a multidisciplinary physical rehabilitation program to ensure safe treatment while eliciting the highest outcome that is of practical value to the patient.: Yes Daily medical management of patient stability for increased activity during participation in an intensive rehabilitation regime.: Yes Daily analysis of laboratory values and/or radiology reports with any subsequent need for medication adjustment of medical intervention for : Mood/behavior problems;Neurological problems;Diabetes problems;Blood pressure problems  Anniebelle Devore, [place] 05/24/2016, 9:14 AM

## 2016-05-24 NOTE — Progress Notes (Signed)
Recreational Therapy Session Note  Patient Details  Name: Aldona Barerry J Marro MRN: [id number] Date of Birth: [birth year] Today's Date: 05/24/2016  Pain: no c/o Skilled Therapeutic Interventions/Progress Updates: Session focused on discharge planning with focus on use of leisure time &community pursuits with potential adaptations (ie use of transport chair for community pursuits).  Pt ambulated with RW ~100" with min assist to therapy kitchen. Once in the kitchen, pt placed groceries on shelves with close supervision-min assist for standing balance, mod assist for sidestepping and instructional cues and encouragement throughout session for safety and problem solving.  Pt able to perform simple tasks seated with supervision/set up assist and verbal cuing.  Pt perseverative on fatigue throughout session,needing encouragement to participate. Therapy/Group: Co-Treatment   Royston Bekele 05/24/2016, 10:19 AM

## 2016-05-24 NOTE — Progress Notes (Signed)
Occupational Therapy Session Note  Patient Details  Name: Chloe Owens MRN: [id number] Date of Birth: April 14, 1956  Today's Date: 05/24/2016 OT Individual Time: 1100-1158 OT Individual Time Calculation (min): 58 min     Short Term Goals:Week 1:  OT Short Term Goal 1 (Week 1): Pt will demonstrate hemiplegic techniques for UB dressing with min A. OT Short Term Goal 1 - Progress (Week 1): Met OT Short Term Goal 2 (Week 1): Pt will demonstrate hemiplegic techniques for LB dressing with mod A in order to increase I in self care. OT Short Term Goal 2 - Progress (Week 1): Met OT Short Term Goal 3 (Week 1): Pt will perform toilet transfer with mod A to decrease level of assist with functional transfer. OT Short Term Goal 3 - Progress (Week 1): Met OT Short Term Goal 4 (Week 1): Pt will maintain standing balance with mod A during LB clothing management.  OT Short Term Goal 4 - Progress (Week 1): Met Week 2:  OT Short Term Goal 1 (Week 2): STG=LTG due to ELOS      Skilled Therapeutic Interventions/Progress Updates:    Pt seen this session to address L side awareness, L side functional use and balance. Pt received in bed stating she needed to urinate and requesting bed pan, stating the toilet was too difficult to get to. Pt coaxed to get out of bed to use toilet.  She transferred well with stand pivot with steadying A to w/c then to toilet. Stood with steadying A to pull pants over hips.  Washed hands at sink with cues to initiate getting the soap.  Pt's blood sugar levels very high and RN provided more medicine.  Pt taken to gym to work on alternating exercises of standing at parallel bars with stepping L foot out and in to improve balance with transfers/ LB dressing and sitting LUE ROM exercises with dowel bar. Pt's activity tolerance low as she was not feeling well, feeling very tired and hot. RN aware. She could only complete 10 reps at a time with short rest breaks in between.  She worked on forced  use of L hand with grasping and releasing bean bags. She had better hand control with cued to visually attend to her L hand. Pt taken back to room with w/c belt on and all needs met.  Therapy Documentation Precautions:  Precautions Precautions: Fall Restrictions Weight Bearing Restrictions: No  Pain: no c/o pain, pt just feeling hot and tired. RN aware.   ADL:  See Function Navigator for Current Functional Status.   Therapy/Group: Individual Therapy  Ginger Leeth 05/24/2016, 12:17 PM

## 2016-05-24 NOTE — Progress Notes (Signed)
Physical Therapy Session Note  Patient Details  Name: Chloe Owens J Templeman MRN: [id number] Date of Birth: 1956/05/22  Today's Date: 05/24/2016 PT Individual Time: 0934-1000 PT Individual Time Calculation (min): 26 min    Short Term Goals: Week 2:  PT Short Term Goal 1 (Week 2): =LTG due to estimated LOS  Skilled Therapeutic Interventions/Progress Updates:    Pt received in w/c & agreeable to tx. Pt denied c/o pain but noted nausea/indigestion with RN present administering meds. Session focused on stair negotiation. Pt negotiated 24 steps (6" + 3") with B rails & min assist requiring max questioning cuing for L hand placement 2/2 inattention, cuing for compensatory pattern, and assistance with L foot clearance and placement on steps. Pt reported need to use restroom and transported back to room via w/c total assist for time management. Pt performed stand pivot w/c<>toilet with use of grab bar and min assist, with max assist to manage clothing. Pt with continent void & BM with total assist for posterior peri hygiene. Pt required cuing for use of soap dispenser and performed hand hygiene from w/c level. At end of session pt left sitting in w/c with all needs within reach, QRB donned, and encouragement to sit up in w/c as long as possible.  Therapy Documentation Precautions:  Precautions Precautions: Fall Restrictions Weight Bearing Restrictions: No   See Function Navigator for Current Functional Status.   Therapy/Group: Individual Therapy  Sandi MariscalVictoria M Jaloni Davoli 05/24/2016, 10:13 AM

## 2016-05-24 NOTE — Progress Notes (Signed)
Social Work Patient ID: Donnie Mesa, female   DOB: Nov 19, 1955, 60 y.o.   MRN: [id number]  Lowella Curb, LCSW Social Worker Signed   Patient Care Conference Date of Service: 05/24/2016  9:14 AM      Hide copied text Hover for attribution information Inpatient RehabilitationTeam Conference and Plan of Care Update Date: 05/23/2016   Time: 9:30 am      Patient Name: Marie Borowski [facility]      Medical Record Number: [id number]  Date of Birth: 1955/10/13 Sex: Female         Room/Bed: 4M06C/4M06C-01 Payor Info: Payor: MEDICARE / Plan: MEDICARE PART A AND B / Product Type: *No Product type* /     Admitting Diagnosis: CVA  Admit Date/Time:  05/10/2016  3:59 PM Admission Comments: No comment available    Primary Diagnosis:  <principal problem not specified> Principal Problem: <principal problem not specified>       Patient Active Problem List    Diagnosis Date Noted  . Acute blood loss anemia    . Labile blood glucose    . Pain    . Diabetes mellitus type 2 in nonobese (HCC)    . AKI (acute kidney injury) ([place])    . Constipation due to pain medication    . Acute ischemic right MCA stroke ([place]) 05/10/2016  . Cognitive deficit, post-stroke    . Adjustment disorder with mixed anxiety and depressed mood    . Nausea without vomiting    . Hemiparesis of right nondominant side due to cerebral infarction ([place])    . Dysarthria, post-stroke    . Hemiparesis of left nondominant side due to cerebral infarction ([place])    . History of CVA with residual deficit    . Benign essential HTN    . Non-rheumatic mitral regurgitation    . Tobacco abuse    . Leukocytosis    . Dysphagia, post-stroke    . Depression 05/07/2016  . Generalized anxiety disorder 05/07/2016  . Ankle fracture, left 04/14/2012  . Fall due to stumbling 04/14/2012  . Hypoglycemia 04/14/2012  . Hypotension 04/14/2012  . Diabetes mellitus ([place]) 04/14/2012  . Hyperlipidemia 04/14/2012  . History of CVA (cerebrovascular accident)  04/14/2012      Expected Discharge Date: Expected Discharge Date: 05/27/16   Team Members Present: Physician leading conference: Dr. Delice Lesch Social Worker Present: Lennart Pall, LCSW Nurse Present: Rayetta Pigg, RN PT Present: Kem Parkinson, PT OT Present: Napoleon Form, OT SLP Present: Weston Anna, SLP PPS Coordinator present : Daiva Nakayama, RN, CRRN       Current Status/Progress Goal Weekly Team Focus  Medical   Cognitive deficits, dysarthria, hemiparesis secondary to right MCA infarct with history of CVA  Improve mobility, comorbidities  See above   Bowel/Bladder   pt is continent of bowel/bladder; last bm 05/21/2016 with supp; stool softners/laxitives scheduled  independent   continue with plan of care   Swallow/Nutrition/ Hydration   Regular textures with thin liquids, Mod I   Mod I  Goals Met, D/C from SLP   ADL's   Min A UB/LB bathing/dressing; Min A transfers; min A standing balance  Suprevision overall  ADL re-training; family education; d/c planning   Mobility   modI bed mobility, min guard transfers, minA gait with RW  S overall, minA stairs- potentially downgrade to minA due to L inattention, impulsivity  Gait training, NMR, dynamic standing balance, activity tolerance and pain management   Communication   Mod I  Mod I  Goals Met, D/C from SLP    Safety/Cognition/ Behavioral Observations           Pain   c/o right flank pain 7/10; tramadol prn and lidoderm patch to area with relief  less than 2  continue with plan of care   Skin   skin is intact with bruising to bilateral abdomen  skin continues to remain intact  continue with plan of care     Rehab Goals Patient on target to meet rehab goals: Yes *See Care Plan and progress notes for long and short-term goals.   Barriers to Discharge: safety awareness, left neglect, DM, HTN, anxiety, AKI   Possible Resolutions to Barriers:  therapies, family ed, optimize BP, DM, anxiety meds   Discharge Planning/Teaching  Needs:  Plan to return home with spouse who can provide 24/7 assistance.  Teaching will be scheduled this week.   Team Discussion:  BSs still variable  - MD adjusting insulin regimen.  Addition of abilfy requested per spouse (on at home).  Currently min-mod assist overall but could be more supervision if effort from pt was improved.  Min-mod with tfs, gait due to left inattention.  May need to downgrade goals to min assist.  ST has d/c'd as pt mod ind with swallow.  Revisions to Treatment Plan:  Likely downgrading of overall goals to min assist.    Continued Need for Acute Rehabilitation Level of Care: The patient requires daily medical management by a physician with specialized training in physical medicine and rehabilitation for the following conditions: Daily direction of a multidisciplinary physical rehabilitation program to ensure safe treatment while eliciting the highest outcome that is of practical value to the patient.: Yes Daily medical management of patient stability for increased activity during participation in an intensive rehabilitation regime.: Yes Daily analysis of laboratory values and/or radiology reports with any subsequent need for medication adjustment of medical intervention for : Mood/behavior problems;Neurological problems;Diabetes problems;Blood pressure problems   Chanin Frumkin, [place] 05/24/2016, 9:14 AM      Lowella Curb, LCSW Social Worker Signed   Patient Care Conference Date of Service: 05/16/2016  6:06 PM      Hide copied text Hover for attribution information Inpatient RehabilitationTeam Conference and Plan of Care Update Date: 05/16/2016   Time: 2:10 pm      Patient Name: Lakisha Peyser [facility]      Medical Record Number: [id number]  Date of Birth: 12-22-1955 Sex: Female         Room/Bed: 4W01C/4W01C-01 Payor Info: Payor: MEDICARE / Plan: MEDICARE PART A AND B / Product Type: *No Product type* /     Admitting Diagnosis: CVA  Admit Date/Time:  05/10/2016  3:59  PM Admission Comments: No comment available    Primary Diagnosis:  <principal problem not specified> Principal Problem: <principal problem not specified>       Patient Active Problem List    Diagnosis Date Noted  . Acute ischemic right MCA stroke ([place]) 05/10/2016  . Cognitive deficit, post-stroke    . Adjustment disorder with mixed anxiety and depressed mood    . Nausea without vomiting    . Hemiparesis of right nondominant side due to cerebral infarction ([place])    . Dysarthria, post-stroke    . Hemiparesis of left nondominant side due to cerebral infarction ([place])    . History of CVA with residual deficit    . Benign essential HTN    . Non-rheumatic mitral regurgitation    . Tobacco abuse    .  Leukocytosis    . Dysphagia, post-stroke    . Depression 05/07/2016  . Generalized anxiety disorder 05/07/2016  . Ankle fracture, left 04/14/2012  . Fall due to stumbling 04/14/2012  . Hypoglycemia 04/14/2012  . Hypotension 04/14/2012  . Diabetes mellitus ([place]) 04/14/2012  . Hyperlipidemia 04/14/2012  . History of CVA (cerebrovascular accident) 04/14/2012      Expected Discharge Date: Expected Discharge Date: 05/27/16   Team Members Present: Physician leading conference: Dr. Alger Simons Social Worker Present: Lennart Pall, LCSW Nurse Present: Heather Roberts, RN PT Present: Kem Parkinson, PT OT Present: Napoleon Form, OT SLP Present: Gunnar Fusi, SLP PPS Coordinator present : Daiva Nakayama, RN, CRRN       Current Status/Progress Goal Weekly Team Focus  Medical   cva with left hemiparesis and inattention, anxieyt issues,   improve visual=spatial awareness  anxiety, nutrition, safety   Bowel/Bladder   Continent of bowel and bladder. LBM 05/14/16  Pt to remain continent of bowel and bladder  Assist to bathroom or BSC at night   Swallow/Nutrition/ Hydration   Regular textures with thin liquids, full supervision for use of strategies   Mod I  Increase use of swallow strategies     ADL's   Mod-max A LB dressing; Mod A toileting; Mod- max A standing balance; Mod A stand pivot transfers  Suprevision overall  ADL re-training, standing balance, activity tolerance, Neuro re-ed   Mobility   S bed mobility, minA transfers, gait with RW  S overall, minA stairs  BLE NMR, gait training, transfer training, activity tolerance   Communication   Supervision  Mod I   Increased use of speech intelligibility strategies    Safety/Cognition/ Behavioral Observations At baseline          Pain   No c/o pain  <3  Monitor for nonverbal cues of pain   Skin   CDI-Intermitent bruising to abdomen  CDI  Assess q shift     Rehab Goals Patient on target to meet rehab goals: Yes *See Care Plan and progress notes for long and short-term goals.   Barriers to Discharge: safety awareness, left visual-spatial deficits   Possible Resolutions to Barriers:  continued v-s training, safety training, family ed, supervision at home   Discharge Planning/Teaching Needs:  Plan to return home with spouse who can provide 24/7 assistance.  To be done closer to d/c   Team Discussion:  Poor sleep; anxiety; adjusting DM meds.  Min - mod assist overall with PT/OT and needs cues to use left hand.  ST d/c'd cogn goals as husband reports she is at baseline.  Still a full supervision for po but on reg diet with thin.  Setting supervision goals but may require some downgrading by next team conference.  Revisions to Treatment Plan:  None    Continued Need for Acute Rehabilitation Level of Care: The patient requires daily medical management by a physician with specialized training in physical medicine and rehabilitation for the following conditions: Daily direction of a multidisciplinary physical rehabilitation program to ensure safe treatment while eliciting the highest outcome that is of practical value to the patient.: Yes Daily medical management of patient stability for increased activity during participation in an  intensive rehabilitation regime.: Yes Daily analysis of laboratory values and/or radiology reports with any subsequent need for medication adjustment of medical intervention for : Mood/behavior problems   Crystalann Korf 05/18/2016, 6:26 AM

## 2016-05-24 NOTE — Discharge Summary (Signed)
Physician Discharge Summary  Patient ID: Chloe Owens MRN: [id number] DOB/AGE: 62/27/1957 62 y.o.  Admit date: 05/10/2016 Discharge date: 05/31/2016  Discharge Diagnoses:  Principal Problem:   Acute ischemic right MCA stroke [facility](HCC) Active Problems:   Benign essential HTN   Cognitive deficit, post-stroke   Adjustment disorder with mixed anxiety and depressed mood   Dysarthria, post-stroke   Hemiparesis of left nondominant side due to cerebral infarction (HCC)   Diabetes mellitus type 2 in nonobese (HCC)   AKI (acute kidney injury) (HCC)   Headache   Discharged Condition: stable   Significant Diagnostic Studies: Dg Sacrum/coccyx  Result Date: 05/12/2016 CLINICAL DATA:  History of fall at home with pain. Initial encounter. EXAM: SACRUM AND COCCYX - 2+ VIEW COMPARISON:  By CT 05/28/2014 FINDINGS: There is no evidence of fracture or other focal bone lesions. IMPRESSION: Negative. Electronically Signed   By: Marnee SpringJonathon  Watts M.D.   On: 05/12/2016 16:17    Labs:  Basic Metabolic Panel:  Recent Labs Lab 05/26/16 1237  GLUCOSE 414*    CBC: No results for input(s): WBC, NEUTROABS, HGB, HCT, MCV, PLT in the last 168 hours.  CBG:  Recent Labs Lab 05/26/16 1626 05/26/16 1653 05/26/16 1903 05/26/16 2031 05/27/16 0644  GLUCAP 59* 78 199* 226* 108*    Brief HPI:   Chloe Owens is a 60 year old female with history of T2DM, HTN, BUE tremors, CVA 2009 with memory deficits  who was admitted on 05/07/16 with slurred speech, difficulty walking and left sided weakness since evening prior to admission. MRI/MRA brain done revealing acute RIGHT MCA lenticulostriate territory infarct affecting the lentiform nucleus and posterior limb internal capsule, advanced atrophy and SVD, severe bilateral PCA disease. 2D echo with EF 65-70%, moderate LVH, aortic sclerosis without stenosis and heavily calcified MV with mild regurgitation could be source of embolic stroke.  Neurology felt that stroke  thrombotic due to small vessel disease and to continue ASA 325 mg daily. PT/OT evaluations done revealing LUE weakness with poor coordination, balance deficits, question of visual deficits and patient requires cues for sequencing with tasks. CIR recommended for follow up therapy   Hospital Course: Chloe Owens was admitted to rehab 05/10/2016 for inpatient therapies to consist of PT, ST and OT at least three hours five days a week. Past admission physiatrist, therapy team and rehab RN have worked together to provide customized collaborative inpatient rehab. Blood pressures were monitored bid and lisinopril was resumed and titrated to home dose for BP control.  Wellbutrin was resumed and titrated to home dose by discharge. Husband and staff reported lability and Abilify was resumed at bedtime.  Nausea has resolved and po intake has improved. Urine culture was checked and was negative for infection.  Her renal status was monitored with serial check and SCr is up to 1.19.  H/H is relatively stable and no signs of infection seen. She continues to have intermittent HA which have been managed with prn tramadol.   Her diabetes was monitored with ac/hs cbg checks and BS has fluctuated greatly from low 50's to low 400's in 24 hour period. Triall of NPH with SSI did not show any improvement therefore she was transitioned back to Lantus 25 units in am and 20 units in pm.  Patient and husband were advised to delay discharge by additional day to allow for monitoring and adjustment of insulin but declined. They felt that her BS would be better managed at home and will be checking cbgs > qid.  They were advised to call PCP/PMR  for input if BS continued to be labile at home.  Her progress has been making slow steady progress and goal were down graded to min assist. She will continue to receive follow up HHPT, HHOT and HHRN by [facility] at home after discharge.   Rehab course: During patient's stay in rehab weekly team  conferences were held to monitor patient's progress, set goals and discuss barriers to discharge. At admission, patient required max assistance with mobility and max to total assist with ADL tasks. She exhibited moderate cognitive impairments affecting memory, problem solving, awareness and safety.  Mild oromotor weakness affected speech intelligibility.  She has had improvement in activity tolerance, balance, postural control, as well as ability to compensate for deficits. She is has had improvement in functional use LUE  and LLE as well as improved awareness. She is able to complete ADL tasks with min assist. She is able to perform transfers supervision and is able to ambulate  160' with min to min guard assist with use of RW with left hand splint. Family education was completed with husband regarding assistance needed with all tasks as well as importance of HEP.     Disposition: Home  Diet: Heart healthy/Diabetic.   Special Instructions: 1. Check blood sugars before meals and at bedtime. 2. Contact MD if blood sugars are running over 150. Be sure to eat a small snack mid-afternoon to avoid dropping your blood sugar.  3. Needs BMET rechecked in 5-7 days.    Discharge Instructions    Ambulatory referral to Physical Medicine Rehab    Complete by:  As directed    1-2 weeks transitional care follow up     Allergies as of 05/27/2016      Reactions   Codeine Nausea And Vomiting, Rash   Penicillins Rash      Medication List    STOP taking these medications   omeprazole 20 MG capsule Commonly known as:  PRILOSEC Replaced by:  pantoprazole 40 MG tablet     TAKE these medications   ARIPiprazole 30 MG tablet Commonly known as:  ABILIFY Take 0.5 tablets (15 mg total) by mouth daily. What changed:  how much to take  when to take this   aspirin EC 325 MG tablet Take 1 tablet (325 mg total) by mouth daily.   atorvastatin 40 MG tablet Commonly known as:  LIPITOR Take 1 tablet (40 mg  total) by mouth daily at 6 PM.   BASAGLAR KWIKPEN 100 UNIT/ML Sopn Use 25 units with breakfast and 20 units at bedtime. What changed:  how much to take  how to take this  when to take this  additional instructions   buPROPion 150 MG 12 hr tablet Commonly known as:  WELLBUTRIN SR Take 150 mg by mouth 2 (two) times daily.   chlorhexidine 0.12 % solution Commonly known as:  PERIDEX Use as directed 15 mLs in the mouth or throat 4 (four) times daily. Notes to patient:  Use for at least a week --contact your dentist if the tooth does not feel better or gets worse.    clonazePAM 0.5 MG tablet Commonly known as:  KLONOPIN Take 1 tablet (0.5 mg total) by mouth 2 (two) times daily.   clopidogrel 75 MG tablet Commonly known as:  PLAVIX Take 1 tablet (75 mg total) by mouth daily.   JANUVIA 100 MG tablet Generic drug:  sitaGLIPtin Take 100 mg by mouth every evening.   lidocaine 5 %  Commonly known as:  LIDODERM Apply to back at 8 pm and remove at 8 am daily. For pain.   lisinopril 20 MG tablet Commonly known as:  PRINIVIL,ZESTRIL Take 1 tablet (20 mg total) by mouth daily.   pantoprazole 40 MG tablet Commonly known as:  PROTONIX Take 1 tablet (40 mg total) by mouth 2 (two) times daily before a meal. Replaces:  omeprazole 20 MG capsule   polyethylene glycol packet Commonly known as:  MIRALAX / GLYCOLAX Take 17 g by mouth daily.   senna-docusate 8.6-50 MG tablet Commonly known as:  Senokot-S Take 2 tablets by mouth at bedtime.   traMADol 50 MG tablet--Rx # 14 pills  Commonly known as:  ULTRAM Take 0.5-1 tablets (25-50 mg total) by mouth every 12 (twelve) hours as needed for moderate pain (or Headache unrelieved by tylenol).      Follow-up Information    Ranelle Oyster, MD Follow up.   Specialty:  Physical Medicine and Rehabilitation Why:  office will call you with follow up appointment Contact information: [address] [place]  [address] [phone]        Xu,Jindong, MD Follow up in 3 day(s).   Specialty:  Neurology Why:  for follow up appointment Contact information: [address] [place] [address] [phone]        Donzetta Sprung, MD. Call in 3 day(s).   Specialty:  Family Medicine Why:  Tues for post hospital follow up in 1-2 weeks.  Contact information: [address] [place] [address] [phone]           Signed: Jacquelynn Cree 05/31/2016, 6:18 PM

## 2016-05-24 NOTE — Progress Notes (Signed)
CBG 447. Chloe Owens aware, orders received. Pt. asymptomatic.

## 2016-05-24 NOTE — Progress Notes (Signed)
Occupational Therapy Session Note  Patient Details  Name: Aldona Barerry J Aguiniga MRN: [id number] Date of Birth: 12/26/1955  Today's Date: 05/24/2016 OT Individual Time: 1300-1413 OT Individual Time Calculation (min): 73 min     Short Term Goals:Week 2:  OT Short Term Goal 1 (Week 2): STG=LTG due to ELOS  Skilled Therapeutic Interventions/Progress Updates:    Pt seen for OT ADL bathing/dressing session with emphasis on family education with pt's husband. Pt sitting up in w/c upon arrival, her husband present and agreeable to showering in ADL apartment utilizing tub/shower combination in simulation of home environment.  Pt's husband provided assist for ambulation and transfer onto tub transfer bench. Educated pt's husband regarding pt's current level of assist, cuing required for ambulation/ attention to L LE, and assist for RW management, importance of independence/ participation with ADLs, importance of OOB, continuum of care, activity progression and monitoring caregiver burden.  Pt bathed seated on tub bench with cuing for attention to all body parts and problem solving using L UE at gross assist level.  She dressed seated in chair, required cues for problem solving clothing orientation and cont to cue for use of hemi-dressing technique. Min A required for LB dressing as pt unable to keep L ankle supported over knee to access for donning socks/ shoes.  She returned to room and requested use of toilet. Husband assisted with to toilet, she cont to require VCs for attention of advancing/ placement of L LE and RW management. Pt left seated on toilet with husband present to assist with transfer from toilet. Cleared husband for stand pivot transfers with pt, updated safety plan.   Therapy Documentation Precautions:  Precautions Precautions: Fall Restrictions Weight Bearing Restrictions: No  See Function Navigator for Current Functional Status.   Therapy/Group: Individual Therapy  Lewis, Zaidee Rion  C 05/24/2016, 7:17 AM

## 2016-05-24 NOTE — Progress Notes (Signed)
[facility] PHYSICAL MEDICINE & REHABILITATION     PROGRESS NOTE    Subjective/Complaints: Pt seen sitting up in bed this AM, eating breakfast.  She likes her new room.  She slept well overnight.   ROS: Denies CP, SOB, N/V/D.  Objective: Vital Signs: Blood pressure (!) 170/76, pulse 94, temperature 98.4 F (36.9 C), temperature source Oral, resp. rate 18, height 5\' 8"  (1.727 m), weight 75.7 kg (166 lb 12.8 oz), SpO2 94 %. No results found.  Recent Labs  05/23/16 0457 05/24/16 0647  WBC 7.7 7.9  HGB 11.9* 12.6  HCT 35.5* 37.2  PLT 262 253    Recent Labs  05/23/16 0457 05/24/16 0647  NA 137 136  K 3.9 4.4  CL 101 101  GLUCOSE 191* 373*  BUN 13 12  CREATININE 1.03* 1.05*  CALCIUM 9.1 9.3   CBG (last 3)   Recent Labs  05/23/16 1707 05/23/16 2106 05/24/16 0642  GLUCAP 66 294* 340*    Wt Readings from Last 3 Encounters:  05/24/16 75.7 kg (166 lb 12.8 oz)  05/23/14 72.6 kg (160 lb)  05/09/12 82.1 kg (181 lb)    Physical Exam:  Constitutional: NAD. Vital signs reviewed. HENT: Normocephalic and atraumatic.  Eyes: No discharge.  Cardiovascular: RRR. No JVD. Respiratory: clear. Unlabored GI: Soft. Bowel sounds are normal.  Musculoskeletal: She exhibits no edema or tenderness.  Neurological: She is alert and oriented x2.  Left facial weakness/central 7.  Motor: RUE/RLE: 4/5 proximal to distal (stronger than left side) LUE/LLE: 4/5 proximal to distal. Tremor.   Sensation intact to light touch   Skin: Skin is warm and dry. No sacral breakdown Psychiatric: Her affect is flat Her speech is delayed.    Assessment/Plan: 1. Functional, cognitive, and mobility deficits secondary to right MCA infarct which require 3+ hours per day of interdisciplinary therapy in a comprehensive inpatient rehab setting. Physiatrist is providing close team supervision and 24 hour management of active medical problems listed below. Physiatrist and rehab team continue to assess  barriers to discharge/monitor patient progress toward functional and medical goals.  Function:  Bathing Bathing position   Position: Shower  Bathing parts Body parts bathed by patient: Right arm, Chest, Abdomen, Right upper leg, Left upper leg, Left arm, Right lower leg, Left lower leg, Front perineal area, Buttocks Body parts bathed by helper: Back  Bathing assist Assist Level: Touching or steadying assistance(Pt > 75%)      Upper Body Dressing/Undressing Upper body dressing   What is the patient wearing?: Pull over shirt/dress, Bra Bra - Perfomed by patient: Thread/unthread right bra strap, Thread/unthread left bra strap Bra - Perfomed by helper: Hook/unhook bra (pull down sports bra) Pull over shirt/dress - Perfomed by patient: Thread/unthread right sleeve, Thread/unthread left sleeve, Put head through opening, Pull shirt over trunk Pull over shirt/dress - Perfomed by helper: Pull shirt over trunk        Upper body assist Assist Level: Set up, Supervision or verbal cues   Set up : To obtain clothing/put away  Lower Body Dressing/Undressing Lower body dressing   What is the patient wearing?: Pants, Shoes, Socks Underwear - Performed by patient: Thread/unthread right underwear leg, Thread/unthread left underwear leg, Pull underwear up/down Underwear - Performed by helper: Pull underwear up/down Pants- Performed by patient: Thread/unthread right pants leg, Thread/unthread left pants leg, Pull pants up/down Pants- Performed by helper: Pull pants up/down Non-skid slipper socks- Performed by patient: Don/doff right sock, Don/doff left sock Non-skid slipper socks- Performed by helper:  Don/doff right sock, Don/doff left sock   Socks - Performed by helper: Don/doff right sock, Don/doff left sock Shoes - Performed by patient: Don/doff right shoe Shoes - Performed by helper: Don/doff left shoe, Fasten right, Fasten left          Lower body assist Assist for lower body dressing:  Touching or steadying assistance (Pt > 75%)      Toileting Toileting Toileting activity did not occur: Refused Toileting steps completed by patient: Performs perineal hygiene Toileting steps completed by helper: Adjust clothing prior to toileting, Adjust clothing after toileting Toileting Assistive Devices: Toilet aid  Toileting assist Assist level: Touching or steadying assistance (Pt.75%)   Transfers Chair/bed transfer   Chair/bed transfer method: Stand pivot Chair/bed transfer assist level: Touching or steadying assistance (Pt > 75%) Chair/bed transfer assistive device: Armrests, Patent attorneyWalker     Locomotion Ambulation     Max distance: 100 Assist level: Touching or steadying assistance (Pt > 75%)   Wheelchair   Type: Manual Max wheelchair distance: 50 ft Assist Level: Touching or steadying assistance (Pt > 75%)  Cognition Comprehension Comprehension assist level: Follows basic conversation/direction with extra time/assistive device  Expression Expression assist level: Expresses basic needs/ideas: With extra time/assistive device  Social Interaction Social Interaction assist level: Interacts appropriately 90% of the time - Needs monitoring or encouragement for participation or interaction.  Problem Solving Problem solving assist level: Solves basic 75 - 89% of the time/requires cueing 10 - 24% of the time  Memory Memory assist level: Recognizes or recalls 75 - 89% of the time/requires cueing 10 - 24% of the time   Medical Problem List and Plan: 1.  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  -continue CIR  2.  DVT Prophylaxis/Anticoagulation: Pharmaceutical: Lovenox 3. Pain Management:   4. Mood: LCSW to follow for evaluation and support.  5. Neuropsych: This patient is not fully capable of making decisions on her own behalf. 6. Skin/Wound Care: routine pressure relief measures.  7. Fluids/Electrolytes/Nutrition: Monitor  I/O. 8. HTN:  Lisinopril on hold with control 12/19   Extremely labile this AM, will cont to monitor and adjust meds if persistent. 9. T2DM:    Started on Lantus 10 qhs  Novolog 5 with breakfast  Cont to be uncontrolled and extremely labile with low of 41 and high of 340 this AM, eating fairly well, however refusing hypoglycemic treatment at times.  CBG (last 3)   Recent Labs  05/23/16 1707 05/23/16 2106 05/24/16 0642  GLUCAP 66 294* 340*  10 Anxiety/depression: team to provide ego support. She was on Abilify, Klonopin and Wellbutrin at home.   Klonopin resumed.     Rresumed low dose Wellbutrin..   Abilify 15mg  qhs resumed on 12/19 due to increased lability per husband  continue prn xanax for breakthrough anxiety   11. Dyslipidemia: On Lipitor.    12. Nausea: improved  continue prilosec  Anxiety may be a component.    -ua equivocal--ucx with multispecies 13. AKI  Cr. 1.05 on 12/20  Cont to encourage fluid intake 14. Constipation  Bowel reg increased 12/18  Improving 15. ABLA  Hb 12.6 on 12/20  Cont to monitor  LOS (Days) 14 A FACE TO FACE EVALUATION WAS PERFORMED  Ankit Karis Jubanil Patel 05/24/2016 8:16 AM

## 2016-05-24 NOTE — Progress Notes (Signed)
Physical Therapy Session Note  Patient Details  Name: Chloe Owens MRN: [id number] Date of Birth: [birth year]  Today's Date: 05/24/2016 PT Individual Time: 0800-0900 PT Individual Time Calculation (min): 60 min    Short Term Goals: Week 2:  PT Short Term Goal 1 (Week 2): =LTG due to estimated LOS  Skilled Therapeutic Interventions/Progress Updates:   Pt received seated in w/c at sink with handoff from NT; c/o pain in R side 5/10 pre-medicated and with lidocaine patch; agreeable to treatment. Co-treat first 30 min of session with recreational therapist. Gait x100' with RW and min guard; occasional facilitation for R weight shifting. Gait in ADL kitchen to retrieve items and return to high cabinets. Requires mod tactile cueing for R weight shifting during side steps, and instructional cueing for problem solving how to reach items far from the shelf by performing side steps, scooting items closer along counter top, then side stepping to return to cabinet. Pt perseverative on fatigue and unsteadiness throughout session; provided repetitive education regarding stroke-related fatigue and importance of continuing therapy as that type of fatigue is often not resolved by resting. Also discussed possibility of using a transport chair for community access; pt in agreement but will need to talk to her husband. Gait on carpeted surface while watering potted plants; required ongoing cueing of problem solving and side stepping to safely reach objects out of reach. In gym, attempted to perform stairs however pt declining stating she would fall. Continued education regarding fatigue, and situations where pt may be fatigued but will still need to get ready to go to a doctors appointment, use the restroom despite not feeling well. At mention of the restroom, pt reports she needs to do so. Ambulated in/out of bathroom with RW and min guard; pt able to doff pants/brief and perform hygiene, requires A for donning new brief  after pt had been incontinent which she was unaware of. Remained seated in w/c at end of session, quick release belt intact and all needs in reach.   Therapy Documentation Precautions:  Precautions Precautions: Fall Restrictions Weight Bearing Restrictions: No   See Function Navigator for Current Functional Status.   Therapy/Group: Individual Therapy  Chloe Owens 05/24/2016, 9:01 AM

## 2016-05-24 NOTE — Plan of Care (Signed)
Problem: RH Bed to Chair Transfers Goal: LTG Patient will perform bed/chair transfers w/assist (PT) LTG: Patient will perform bed/chair transfers with assistance, with/without cues (PT).  Downgraded d/t slow progress, L inattention  Problem: RH Furniture Transfers Goal: LTG Patient will perform furniture transfers w/assist (OT/PT LTG: Patient will perform furniture transfers  with assistance (OT/PT).  Downgraded d/t slow progress, L inattention  Problem: RH Ambulation Goal: LTG Patient will ambulate in controlled environment (PT) LTG: Patient will ambulate in a controlled environment, # of feet with assistance (PT).  Downgraded d/t slow progress, L inattention Goal: LTG Patient will ambulate in home environment (PT) LTG: Patient will ambulate in home environment, # of feet with assistance (PT).  Downgraded d/t slow progress, L inattention   Problem: RH Wheelchair Mobility Goal: LTG Patient will propel w/c in controlled environment (PT) LTG: Patient will propel wheelchair in controlled environment, # of feet with assist (PT)  Outcome: Not Applicable Date Met: 33/00/76 D/c, pt will not self propel at d/c Goal: LTG Patient will propel w/c in home environment (PT) LTG: Patient will propel wheelchair in home environment, # of feet with assistance (PT).  Outcome: Not Applicable Date Met: 22/63/33 D/c, pt will not self-propel at d/c

## 2016-05-25 ENCOUNTER — Inpatient Hospital Stay (HOSPITAL_COMMUNITY): Payer: Medicare Other | Admitting: Occupational Therapy

## 2016-05-25 ENCOUNTER — Inpatient Hospital Stay (HOSPITAL_COMMUNITY): Payer: Medicare Other | Admitting: Physical Therapy

## 2016-05-25 ENCOUNTER — Ambulatory Visit (HOSPITAL_COMMUNITY): Payer: Medicare Other | Admitting: Physical Therapy

## 2016-05-25 DIAGNOSIS — R0989 Other specified symptoms and signs involving the circulatory and respiratory systems: Secondary | ICD-10-CM

## 2016-05-25 LAB — GLUCOSE, CAPILLARY
Glucose-Capillary: 131 mg/dL — ABNORMAL HIGH (ref 65–99)
Glucose-Capillary: 295 mg/dL — ABNORMAL HIGH (ref 65–99)
Glucose-Capillary: 137 mg/dL — ABNORMAL HIGH (ref 65–99)
Glucose-Capillary: 124 mg/dL — ABNORMAL HIGH (ref 65–99)

## 2016-05-25 MED ORDER — INSULIN GLARGINE 100 UNIT/ML ~~LOC~~ SOLN
25.0000 [IU] | Freq: Every day | SUBCUTANEOUS | Status: DC
  Administered 2016-05-25: 25 [IU] via SUBCUTANEOUS
  Filled 2016-05-25 (×2): qty 0.25

## 2016-05-25 MED ORDER — INSULIN GLARGINE 100 UNIT/ML ~~LOC~~ SOLN
25.0000 [IU] | Freq: Two times a day (BID) | SUBCUTANEOUS | Status: DC
  Filled 2016-05-25: qty 0.25

## 2016-05-25 MED ORDER — INSULIN GLARGINE 100 UNIT/ML ~~LOC~~ SOLN
20.0000 [IU] | Freq: Every day | SUBCUTANEOUS | Status: DC
  Filled 2016-05-25: qty 0.2

## 2016-05-25 MED ORDER — LISINOPRIL 20 MG PO TABS
20.0000 mg | ORAL_TABLET | Freq: Every day | ORAL | Status: DC
Start: 2016-05-25 — End: 2016-05-27
  Administered 2016-05-25 – 2016-05-27 (×3): 20 mg via ORAL
  Filled 2016-05-25 (×3): qty 1

## 2016-05-25 NOTE — Progress Notes (Signed)
Physical Therapy Note  Patient Details  Name: Chloe Owens MRN: [id number] Date of Birth: 28-Mar-1956 Today's Date: 05/25/2016    Time: 1000-1025 25 minutes  1:1 No c/o pain. Pt asleep upon PT arrival, agreeable to PT without encouragement. Pt performed stand pivot transfers with min A both directions in blocked practice.  Stair negotiation x 8 stairs with min A, cues for Lt UE placement and sequencing. Pt with significant forward lean in order to advance Lt LE on stairs.  Kinetron 3 x 2 minutes for LE strength and endurance.   Genine Beckett 05/25/2016, 10:25 AM

## 2016-05-25 NOTE — Progress Notes (Signed)
[facility] PHYSICAL MEDICINE & REHABILITATION     PROGRESS NOTE    Subjective/Complaints: Pt sitting up in her chair having returned from therapies.  She remains flat.  She states she slept well overnight.   ROS: Denies CP, SOB, N/V/D.  Objective: Vital Signs: Blood pressure (!) 167/90, pulse 88, temperature 98.1 F (36.7 C), temperature source Oral, resp. rate 18, height 5\' 8"  (1.727 m), weight 75.7 kg (166 lb 12.8 oz), SpO2 98 %. No results found.  Recent Labs  05/23/16 0457 05/24/16 0647  WBC 7.7 7.9  HGB 11.9* 12.6  HCT 35.5* 37.2  PLT 262 253    Recent Labs  05/24/16 0647 05/24/16 1213  NA 136 136  K 4.4 3.6  CL 101 102  GLUCOSE 373* 373*  BUN 12 14  CREATININE 1.05* 1.19*  CALCIUM 9.3 9.9   CBG (last 3)   Recent Labs  05/24/16 1614 05/24/16 2029 05/25/16 0639  GLUCAP 131* 298* 131*    Wt Readings from Last 3 Encounters:  05/24/16 75.7 kg (166 lb 12.8 oz)  05/23/14 72.6 kg (160 lb)  05/09/12 82.1 kg (181 lb)    Physical Exam:  Constitutional: NAD. Vital signs reviewed. HENT: Normocephalic and atraumatic.  Eyes: No discharge.  Cardiovascular: RRR. No JVD. Respiratory: clear. Unlabored GI: Soft. Bowel sounds are normal.  Musculoskeletal: She exhibits no edema or tenderness.  Neurological: She is alert.  Left facial weakness/central 7.  Motor: RUE/RLE: 4/5 proximal to distal (stronger than left side), RLE tremor.  LUE/LLE: 4/5 proximal to distal.  Sensation intact to light touch   Skin: Skin is warm and dry. No sacral breakdown Psychiatric: Her affect is flat Her speech is delayed.    Assessment/Plan: 1. Functional, cognitive, and mobility deficits secondary to right MCA infarct which require 3+ hours per day of interdisciplinary therapy in a comprehensive inpatient rehab setting. Physiatrist is providing close team supervision and 24 hour management of active medical problems listed below. Physiatrist and rehab team continue to assess  barriers to discharge/monitor patient progress toward functional and medical goals.  Function:  Bathing Bathing position   Position: Shower  Bathing parts Body parts bathed by patient: Right arm, Chest, Abdomen, Right upper leg, Left upper leg, Left arm, Right lower leg, Left lower leg, Front perineal area, Buttocks Body parts bathed by helper: Back  Bathing assist Assist Level: Touching or steadying assistance(Pt > 75%)      Upper Body Dressing/Undressing Upper body dressing   What is the patient wearing?: Pull over shirt/dress Bra - Perfomed by patient: Thread/unthread right bra strap, Thread/unthread left bra strap Bra - Perfomed by helper: Hook/unhook bra (pull down sports bra) Pull over shirt/dress - Perfomed by patient: Thread/unthread right sleeve, Put head through opening, Pull shirt over trunk Pull over shirt/dress - Perfomed by helper: Thread/unthread left sleeve        Upper body assist Assist Level: Touching or steadying assistance(Pt > 75%)   Set up : To obtain clothing/put away  Lower Body Dressing/Undressing Lower body dressing   What is the patient wearing?: Pants, Socks, Shoes Underwear - Performed by patient: Thread/unthread right underwear leg, Thread/unthread left underwear leg, Pull underwear up/down Underwear - Performed by helper: Pull underwear up/down Pants- Performed by patient: Thread/unthread right pants leg, Pull pants up/down Pants- Performed by helper: Thread/unthread left pants leg Non-skid slipper socks- Performed by patient: Don/doff right sock, Don/doff left sock Non-skid slipper socks- Performed by helper: Don/doff right sock, Don/doff left sock Socks - Performed by  patient: Don/doff right sock, Don/doff left sock Socks - Performed by helper: Don/doff right sock Shoes - Performed by patient: Don/doff right shoe, Don/doff left shoe Shoes - Performed by helper: Fasten right, Fasten left          Lower body assist Assist for lower body  dressing: Touching or steadying assistance (Pt > 75%)      Toileting Toileting Toileting activity did not occur: Refused Toileting steps completed by patient: Performs perineal hygiene, Adjust clothing prior to toileting, Adjust clothing after toileting Toileting steps completed by helper: Adjust clothing prior to toileting, Performs perineal hygiene, Adjust clothing after toileting Toileting Assistive Devices: Grab bar or rail  Toileting assist Assist level: Touching or steadying assistance (Pt.75%)   Transfers Chair/bed transfer   Chair/bed transfer method: Stand pivot Chair/bed transfer assist level: Touching or steadying assistance (Pt > 75%) Chair/bed transfer assistive device: Armrests, Patent attorneyWalker     Locomotion Ambulation     Max distance: 80 Assist level: Touching or steadying assistance (Pt > 75%)   Wheelchair   Type: Manual Max wheelchair distance: 50 ft Assist Level: Touching or steadying assistance (Pt > 75%)  Cognition Comprehension Comprehension assist level: Follows basic conversation/direction with extra time/assistive device  Expression Expression assist level: Expresses basic needs/ideas: With extra time/assistive device  Social Interaction Social Interaction assist level: Interacts appropriately 90% of the time - Needs monitoring or encouragement for participation or interaction.  Problem Solving Problem solving assist level: Solves basic 75 - 89% of the time/requires cueing 10 - 24% of the time  Memory Memory assist level: Recognizes or recalls 75 - 89% of the time/requires cueing 10 - 24% of the time   Medical Problem List and Plan: 1.  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  -continue CIR  2.  DVT Prophylaxis/Anticoagulation: Pharmaceutical: Lovenox 3. Pain Management:   4. Mood: LCSW to follow for evaluation and support.  5. Neuropsych: This patient is not fully capable of making decisions on her own  behalf. 6. Skin/Wound Care: routine pressure relief measures.  7. Fluids/Electrolytes/Nutrition: Monitor I/O. 8. HTN:    Lisinopril 10 started 12/21 (home 20)  Very labile again 9. T2DM:    Lantus 20 BID  Novolog 5 with breakfast, with 0-5U at bedtime  Remains uncontrolled and extremely labile with high of 447  CBG (last 3)   Recent Labs  05/24/16 1614 05/24/16 2029 05/25/16 0639  GLUCAP 131* 298* 131*  10 Anxiety/depression: team to provide ego support. She was on Abilify, Klonopin and Wellbutrin at home.   Klonopin resumed.     Rresumed low dose Wellbutrin..   Abilify 15mg  qhs resumed on 12/19 due to increased lability per husband  continue prn xanax for breakthrough anxiety   11. Dyslipidemia: On Lipitor.    12. Nausea: improved  continue prilosec  Anxiety may be a component.    -ua equivocal--ucx with multispecies 13. AKI  Cr. 1.05 on 12/20  Cont to encourage fluid intake 14. Constipation  Bowel reg increased 12/18  Improving 15. ABLA  Hb 12.6 on 12/20  Cont to monitor  LOS (Days) 15 A FACE TO FACE EVALUATION WAS PERFORMED  Adael Culbreath Karis Jubanil Yaeko Fazekas 05/25/2016 9:11 AM

## 2016-05-25 NOTE — Progress Notes (Signed)
Recreational Therapy Discharge Summary Patient Details  Name: Chloe Owens MRN: [id number] Date of Birth: August 25, 1955 Today's Date: 05/25/2016  Long term goals set: 1  Long term goals met: 1  Comments on progress toward goals: Pt is discharging home at supervision level for simple TR tasks seated requiring verbal cuing/instruction/encouragement to participate.  Pt is scheduled for discharge home 12/23 with family to provide/coordinate 24 hour supervision/assist.  Reasons for discharge: discharge from hospital Patient/family agrees with progress made and goals achieved: Yes  Graeden Bitner 05/25/2016, 2:37 PM

## 2016-05-25 NOTE — Progress Notes (Signed)
Occupational Therapy Session Note  Patient Details  Name: Chloe Owens J Madding MRN: [id number] Date of Birth: [birth year]  Today's Date: 05/25/2016 OT Individual Time: 1415-1500 OT Individual Time Calculation (min): 45 min     Short Term Goals:Week 2:  OT Short Term Goal 1 (Week 2): STG=LTG due to ELOS  Skilled Therapeutic Interventions/Progress Updates:    Pt seen for OT session focusing on family training for functional transfers and ambulation. Pt sitting EOB upon arrival with husband present for caregiver training. Pt's husband provided min A throughout session for pt.  In ADL apartment, pt completed simulated tub/shower transfer utilizing tub bench, completed with min A and mod VCs for technique and RW management. Pt cont to require constant VCs for hand placement on RW during sit <> stands.  She completed transfer from low soft surface couch with min A, LOB to L when standing requiring min-mod A to regain balance.  She returned to room and provided with soft (yellow) theraputty and resistant sponge along with handouts of HEP. Pt displays improved gross motor abilities in L hand compared to previous sessions.  Cont to Educate regarding pt's level of assist, recommendation for BSC at bedside at night only, importance of participation/ independence with ADLs, rate of recovery, and d/c planning.    Therapy Documentation Precautions:  Precautions Precautions: Fall Restrictions Weight Bearing Restrictions: No  See Function Navigator for Current Functional Status.   Therapy/Group: Individual Therapy  Lewis, Teira Arcilla C 05/25/2016, 7:12 AM

## 2016-05-25 NOTE — Progress Notes (Signed)
Physical Therapy Session Note  Patient Details  Name: Chloe Owens J Allinson MRN: [id number] Date of Birth: Aug 23, 1955  Today's Date: 05/25/2016 PT Individual Time: 0800-0900 and 1315-1415 PT Individual Time Calculation (min): 60 min and 60 min (total 120 min)    Short Term Goals: Week 2:  PT Short Term Goal 1 (Week 2): =LTG due to estimated LOS  Skilled Therapeutic Interventions/Progress Updates:   Tx 1: Pt received seated in bed, denies pain and agreeable to treatment with encouragement. Supine>sit with S, HOB elevated and bedrails. Pt dons shirt with mod instructional cueing and significantly increased time, encouragement to attempt on her own after pt perseverating on her inability to do it. Socks donned totalA d/t urgency to go to the bathroom. Pt ambulates in/out of bathroom with RW and min guard with cues during turn towards toilet due to poor LLE progression and narrow BOS. Pt incontinent of bladder while attempting to pull down brief. Performs hygiene however requires assist for brief management; recommended pt use pull up briefs at home. Pt threads RLE of pants without assist, requires assist and cueing for LLE. Performs hygiene at sink with setupA and increased time, encouragement to perform without assist. Pt dons B socks and shoes with minA for stabilizing LLE when crossed over knee and therapist tied shoes. Gait with RW and minA x80'; requires assist for R weight shift to improve LLE clearance. Stand pivot transfer w/c >mat table to R side, no AD and close S. Standing alternating toe taps to 1" step with min/modA due to LOB to L side and incoordination taking L foot off step causing LOB. Gait x80' with RW and min guard, cueing/facilitation at R glutes for trunk/hip extension. Remained seated in w/c at end of session, quick release belt intact and all needs in reach.   Tx 2: Pt received seated in recliner, denies pain and agreeable to treatment. Husband present for hands-on training. Husband  assists pt with transfer recliner <>w/c, car transfer with minA. Performed ascent/descent of stairs without handrails to simulate home environment, husband providing modA and cues for weight shifting to improve LLE clearance. Pt ambulated in/out of bathroom, performed hygiene and clothing management with husband providing minA. Husband demonstrates appropriate cueing and assist throughout, including weight shifting to improve LLE foot clearance and safety during all activities. Husband required min cueing to recall that he should be standing on pt's L side when pt is transferring or ambulating. Educated pt and husband in home safety, discussed fatigue and depression as an effect of the stroke, modifications for decreased sensation and cognitive deficits. Pt and husband with no further questions at this time; recommended they write down any questions they think of before tomorrows session to allow time to review. Pt remained seated EOB with husband present at end of session, all needs in reach.   Therapy Documentation Precautions:  Precautions Precautions: Fall Restrictions Weight Bearing Restrictions: No   See Function Navigator for Current Functional Status.   Therapy/Group: Individual Therapy  Vista Lawmanlizabeth J Tygielski 05/25/2016, 8:47 AM

## 2016-05-25 NOTE — Plan of Care (Signed)
Problem: RH BLADDER ELIMINATION Goal: RH STG MANAGE BLADDER WITH ASSISTANCE STG Manage Bladder With minimal assist  Outcome: Progressing Remains continent of bladder but has bladder frequency.  Problem: RH SAFETY Goal: RH STG ADHERE TO SAFETY PRECAUTIONS W/ASSISTANCE/DEVICE STG Adhere to Safety Precautions With min Assistance/Device.  Outcome: Progressing Continues to use call light for assistance

## 2016-05-26 ENCOUNTER — Inpatient Hospital Stay (HOSPITAL_COMMUNITY): Payer: Medicare Other | Admitting: Occupational Therapy

## 2016-05-26 ENCOUNTER — Inpatient Hospital Stay (HOSPITAL_COMMUNITY): Payer: Medicare Other | Admitting: Physical Therapy

## 2016-05-26 ENCOUNTER — Ambulatory Visit (HOSPITAL_COMMUNITY): Payer: Medicare Other | Admitting: Physical Therapy

## 2016-05-26 LAB — GLUCOSE, CAPILLARY
Glucose-Capillary: 202 mg/dL — ABNORMAL HIGH (ref 65–99)
Glucose-Capillary: 60 mg/dL — ABNORMAL LOW (ref 65–99)
Glucose-Capillary: 427 mg/dL — ABNORMAL HIGH (ref 65–99)
Glucose-Capillary: 318 mg/dL — ABNORMAL HIGH (ref 65–99)
Glucose-Capillary: 436 mg/dL — ABNORMAL HIGH (ref 65–99)
Glucose-Capillary: 59 mg/dL — ABNORMAL LOW (ref 65–99)
Glucose-Capillary: 78 mg/dL (ref 65–99)
Glucose-Capillary: 199 mg/dL — ABNORMAL HIGH (ref 65–99)
Glucose-Capillary: 226 mg/dL — ABNORMAL HIGH (ref 65–99)

## 2016-05-26 LAB — GLUCOSE, RANDOM: Glucose, Bld: 414 mg/dL — ABNORMAL HIGH (ref 65–99)

## 2016-05-26 MED ORDER — LISINOPRIL 20 MG PO TABS: 20.0000 mg | ORAL_TABLET | Freq: Every day | ORAL | 0 refills | Status: DC

## 2016-05-26 MED ORDER — SENNOSIDES-DOCUSATE SODIUM 8.6-50 MG PO TABS: 2.0000 | ORAL_TABLET | Freq: Every day | ORAL | 0 refills | Status: DC

## 2016-05-26 MED ORDER — CHLORHEXIDINE GLUCONATE 0.12 % MT SOLN: 15.0000 mL | Freq: Four times a day (QID) | OROMUCOSAL | 0 refills | Status: DC

## 2016-05-26 MED ORDER — TRAMADOL HCL 50 MG PO TABS: 25.0000 mg | ORAL_TABLET | Freq: Two times a day (BID) | ORAL | 0 refills | Status: DC | PRN

## 2016-05-26 MED ORDER — SORBITOL 70 % SOLN: 45.0000 mL | Freq: Once | Status: DC

## 2016-05-26 MED ORDER — POLYETHYLENE GLYCOL 3350 17 G PO PACK: 17.0000 g | PACK | Freq: Every day | ORAL | 0 refills | Status: AC

## 2016-05-26 MED ORDER — CLOPIDOGREL BISULFATE 75 MG PO TABS: 75.0000 mg | ORAL_TABLET | Freq: Every day | ORAL | 0 refills | Status: AC

## 2016-05-26 MED ORDER — LIDOCAINE 5 % EX PTCH: MEDICATED_PATCH | CUTANEOUS | 0 refills | Status: DC

## 2016-05-26 MED ORDER — BASAGLAR KWIKPEN 100 UNIT/ML ~~LOC~~ SOPN: PEN_INJECTOR | SUBCUTANEOUS | 0 refills | Status: DC

## 2016-05-26 MED ORDER — ARIPIPRAZOLE 30 MG PO TABS: 15.0000 mg | ORAL_TABLET | Freq: Every day | ORAL | Status: AC

## 2016-05-26 MED ORDER — CHLORHEXIDINE GLUCONATE 0.12 % MT SOLN
15.0000 mL | Freq: Four times a day (QID) | OROMUCOSAL | Status: DC
  Administered 2016-05-26 – 2016-05-27 (×2): 15 mL via OROMUCOSAL
  Filled 2016-05-26 (×2): qty 15

## 2016-05-26 MED ORDER — INSULIN GLARGINE 100 UNIT/ML ~~LOC~~ SOLN
25.0000 [IU] | Freq: Every day | SUBCUTANEOUS | Status: DC
  Administered 2016-05-26 – 2016-05-27 (×2): 25 [IU] via SUBCUTANEOUS
  Filled 2016-05-26 (×2): qty 0.25

## 2016-05-26 MED ORDER — LIDOCAINE 5 % EX PTCH: 1.0000 | MEDICATED_PATCH | CUTANEOUS | 0 refills | Status: DC

## 2016-05-26 MED ORDER — INSULIN GLARGINE 100 UNIT/ML ~~LOC~~ SOLN
20.0000 [IU] | Freq: Every day | SUBCUTANEOUS | Status: DC
  Administered 2016-05-26: 20 [IU] via SUBCUTANEOUS
  Filled 2016-05-26: qty 0.2

## 2016-05-26 MED ORDER — PANTOPRAZOLE SODIUM 40 MG PO TBEC: 40.0000 mg | DELAYED_RELEASE_TABLET | Freq: Two times a day (BID) | ORAL | 0 refills | Status: AC

## 2016-05-26 NOTE — Progress Notes (Signed)
[place] PHYSICAL MEDICINE & REHABILITATION     PROGRESS NOTE    Subjective/Complaints: Pt seen working with therapies this AM.  She slept well overnight.  She does not offer much.  Per therapy, she is a little more alert this AM.    ROS: Denies CP, SOB, N/V/D.  Objective: Vital Signs: Blood pressure (!) 147/83, pulse 88, temperature 98.5 F (36.9 C), temperature source Oral, resp. rate 18, height 5\' 8"  (1.727 m), weight 75.7 kg (166 lb 12.8 oz), SpO2 97 %. No results found.  Recent Labs  05/24/16 0647  WBC 7.9  HGB 12.6  HCT 37.2  PLT 253    Recent Labs  05/24/16 0647 05/24/16 1213  NA 136 136  K 4.4 3.6  CL 101 102  GLUCOSE 373* 373*  BUN 12 14  CREATININE 1.05* 1.19*  CALCIUM 9.3 9.9   CBG (last 3)   Recent Labs  05/25/16 2108 05/26/16 0643 05/26/16 0728  GLUCAP 124* 60* 202*    Wt Readings from Last 3 Encounters:  05/24/16 75.7 kg (166 lb 12.8 oz)  05/23/14 72.6 kg (160 lb)  05/09/12 82.1 kg (181 lb)    Physical Exam:  Constitutional: NAD. Vital signs reviewed. HENT: Normocephalic and atraumatic.  Eyes: No discharge.  Cardiovascular: RRR. No JVD. Respiratory: clear. Unlabored GI: Soft. Bowel sounds are normal.  Musculoskeletal: She exhibits no edema or tenderness.  Neurological: She is alert.  Left facial weakness/central 7.  Motor: RUE/RLE: 4+/5 proximal to distal. RLE tremor.  LUE/LLE: 4--4/5 proximal to distal.  Sensation intact to light touch   Skin: Skin is warm and dry. No sacral breakdown Psychiatric: Her affect is flat Her speech is delayed.    Assessment/Plan: 1. Functional, cognitive, and mobility deficits secondary to right MCA infarct which require 3+ hours per day of interdisciplinary therapy in a comprehensive inpatient rehab setting. Physiatrist is providing close team supervision and 24 hour management of active medical problems listed below. Physiatrist and rehab team continue to assess barriers to discharge/monitor  patient progress toward functional and medical goals.  Function:  Bathing Bathing position   Position: Shower  Bathing parts Body parts bathed by patient: Right arm, Chest, Abdomen, Right upper leg, Left upper leg, Left arm, Right lower leg, Left lower leg, Front perineal area, Buttocks Body parts bathed by helper: Back  Bathing assist Assist Level: Touching or steadying assistance(Pt > 75%)      Upper Body Dressing/Undressing Upper body dressing   What is the patient wearing?: Pull over shirt/dress Bra - Perfomed by patient: Thread/unthread right bra strap, Thread/unthread left bra strap Bra - Perfomed by helper: Hook/unhook bra (pull down sports bra) Pull over shirt/dress - Perfomed by patient: Thread/unthread right sleeve, Put head through opening, Pull shirt over trunk Pull over shirt/dress - Perfomed by helper: Thread/unthread left sleeve        Upper body assist Assist Level: Touching or steadying assistance(Pt > 75%)   Set up : To obtain clothing/put away  Lower Body Dressing/Undressing Lower body dressing   What is the patient wearing?: Socks, Shoes Underwear - Performed by patient: Thread/unthread right underwear leg, Thread/unthread left underwear leg, Pull underwear up/down Underwear - Performed by helper: Pull underwear up/down Pants- Performed by patient: Thread/unthread right pants leg, Pull pants up/down Pants- Performed by helper: Thread/unthread left pants leg Non-skid slipper socks- Performed by patient: Don/doff right sock, Don/doff left sock Non-skid slipper socks- Performed by helper: Don/doff right sock, Don/doff left sock Socks - Performed by patient: Don/doff  left sock Socks - Performed by helper: Don/doff right sock Shoes - Performed by patient: Don/doff right shoe, Don/doff left shoe Shoes - Performed by helper: Fasten right, Fasten left          Lower body assist Assist for lower body dressing: Touching or steadying assistance (Pt > 75%)       Toileting Toileting Toileting activity did not occur: Refused Toileting steps completed by patient: Adjust clothing prior to toileting, Performs perineal hygiene, Adjust clothing after toileting Toileting steps completed by helper: Adjust clothing prior to toileting, Performs perineal hygiene, Adjust clothing after toileting Toileting Assistive Devices: Grab bar or rail  Toileting assist Assist level: Touching or steadying assistance (Pt.75%)   Transfers Chair/bed transfer   Chair/bed transfer method: Stand pivot Chair/bed transfer assist level: Supervision or verbal cues Chair/bed transfer assistive device: Patent attorneyWalker     Locomotion Ambulation     Max distance: 80 Assist level: Touching or steadying assistance (Pt > 75%)   Wheelchair   Type: Manual Max wheelchair distance: 50 ft Assist Level: Touching or steadying assistance (Pt > 75%)  Cognition Comprehension Comprehension assist level: Understands basic 90% of the time/cues < 10% of the time  Expression Expression assist level: Expresses basic 90% of the time/requires cueing < 10% of the time.  Social Interaction Social Interaction assist level: Interacts appropriately 75 - 89% of the time - Needs redirection for appropriate language or to initiate interaction.  Problem Solving Problem solving assist level: Solves basic 75 - 89% of the time/requires cueing 10 - 24% of the time  Memory Memory assist level: Recognizes or recalls 75 - 89% of the time/requires cueing 10 - 24% of the time   Medical Problem List and Plan: 1.  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  -continue CIR  2.  DVT Prophylaxis/Anticoagulation: Pharmaceutical: Lovenox 3. Pain Management:   4. Mood: LCSW to follow for evaluation and support.  5. Neuropsych: This patient is not fully capable of making decisions on her own behalf. 6. Skin/Wound Care: routine pressure relief measures.  7.  Fluids/Electrolytes/Nutrition: Monitor I/O. 8. HTN:    Lisinopril 10 started 12/21 (home 20)  Improving, will cont to monitor 9. T2DM:    Lantus 20 AM, 25 qhs, changed to 20qhs 25 AM 12/22  Novolog 5 with breakfast, with 0-5U at bedtime  Remains uncontrolled and extremely labile with low of 60  CBG (last 3)   Recent Labs  05/25/16 2108 05/26/16 0643 05/26/16 0728  GLUCAP 124* 60* 202*  10 Anxiety/depression: team to provide ego support. She was on Abilify, Klonopin and Wellbutrin at home.   Klonopin resumed.     Rresumed low dose Wellbutrin..   Abilify 15mg  qhs resumed on 12/19 due to increased lability per husband  continue prn xanax for breakthrough anxiety   11. Dyslipidemia: On Lipitor.    12. Nausea: improved  continue prilosec  Anxiety may be a component.    -ua equivocal--ucx with multispecies 13. AKI  Cr. 1.19 on 12/20  Cont to encourage fluid intake 14. Constipation  Bowel reg increased 12/18  Improving 15. ABLA  Hb 12.6 on 12/20  Cont to monitor  LOS (Days) 16 A FACE TO FACE EVALUATION WAS PERFORMED  Nyshaun Standage Karis Jubanil Khambrel Amsden 05/26/2016 9:03 AM

## 2016-05-26 NOTE — Progress Notes (Signed)
Social Work  Discharge Note  The overall goal for the admission was met for:   Discharge location: Yes - home with spouse who can provide 24/7 assistance  Length of Stay: Yes - 17 days (with 12/23 discharge)  Discharge activity level: Yes  - met (downgraded) goals of min assist  Home/community participation: Yes  Services provided included: MD, RD, PT, OT, SLP, RN, TR, Pharmacy, Neuropsych and SW  Financial Services: Medicare and Private Insurance: [place]  Follow-up services arranged: Home Health: Therapist, sports, PT, OT via [facility], DME: transport w/c, 3n1 commode and tub bench via [facility] and Patient/Family has no preference for HH/DME agencies  Comments (or additional information):  Patient/Family verbalized understanding of follow-up arrangements: Yes  Individual responsible for coordination of the follow-up plan: spouse  Confirmed correct DME delivered: Quintasia Theroux 05/26/2016    Emary Zalar

## 2016-05-26 NOTE — Discharge Instructions (Signed)
Inpatient Rehab Discharge Instructions  [facility] date and time:  05/27/16  Activities/Precautions/ Functional Status: Activity: no lifting, driving, or strenuous exercise  till cleared by MD Diet: cardiac diet and diabetic diet. Eat a small snack at bedtime if blood sugar below 150.  Wound Care: none needed   Functional status:  ___ No restrictions     ___ Walk up steps independently _X__ 24/7 supervision/assistance   ___ Walk up steps with assistance ___ Intermittent supervision/assistance  ___ Bathe/dress independently ___ Walk with walker     _X__ Bathe/dress with assistance ___ Walk Independently    ___ Shower independently _X__ Walk with assistance    ___ Shower with assistance _X__ No alcohol     ___ Return to work/school ________    COMMUNITY REFERRALS UPON DISCHARGE:    Home Health:   PT     OT     RN                       Agency: [facility]    Phone: [phone]    Medical Equipment/Items Ordered:  Transport wheelchair, commode and tub bench                                                      Agency/Supplier:  [facility] @ [phone]   GENERAL COMMUNITY RESOURCES FOR PATIENT/FAMILY:  Support Groups:   [facility]                               Thursday 06/22/16 @ 3:00 pm                                [facility]                        Contact:  Teodoro KilCaitlin Penven-Crew @ [phone]      Special Instructions: 1. Check blood sugars before meals and at bedtime. 2. Contact MD if blood sugars are running over 150. Be sure to eat a small snack mid-afternoon to avoid dropping your blood sugar.   STROKE/TIA DISCHARGE INSTRUCTIONS SMOKING Cigarette smoking nearly doubles your risk of having a stroke & is the single most alterable risk factor  If you smoke or have smoked in the last 12 months, you are advised to quit smoking for your health.  Most of the excess cardiovascular risk related to smoking disappears within a year of  stopping.  Ask you doctor about anti-smoking medications  [place] Quit Line: 1-800-QUIT NOW  Free Smoking Cessation Classes [phone]  CHOLESTEROL Know your levels; limit fat & cholesterol in your diet  Lipid Panel     Component Value Date/Time   CHOL 181 05/07/2016 0809   TRIG 74 05/07/2016 0809   HDL 59 05/07/2016 0809   CHOLHDL 3.1 05/07/2016 0809   VLDL 15 05/07/2016 0809   LDLCALC 107 (H) 05/07/2016 0809      Many patients benefit from treatment even if their cholesterol is at goal.  Goal: Total Cholesterol (CHOL) less than 160  Goal:  Triglycerides (TRIG) less than 150  Goal:  HDL greater than 40  Goal:  LDL (LDLCALC)  less than 100   BLOOD PRESSURE American Stroke Association blood pressure target is less that 120/80 mm/Hg  Your discharge blood pressure is:  BP: 134/65  Monitor your blood pressure  Limit your salt and alcohol intake  Many individuals will require more than one medication for high blood pressure  DIABETES (A1c is a blood sugar average for last 3 months) Goal HGBA1c is under 7% (HBGA1c is blood sugar average for last 3 months)  Diabetes:     Lab Results  Component Value Date   HGBA1C 8.2 (H) 05/07/2016     Your HGBA1c can be lowered with medications, healthy diet, and exercise.  Check your blood sugar as directed by your physician  Call your physician if you experience unexplained or low blood sugars.  PHYSICAL ACTIVITY/REHABILITATION Goal is 30 minutes at least 4 days per week  Activity: No driving, Therapies:  See above Return to work: N/A  Activity decreases your risk of heart attack and stroke and makes your heart stronger.  It helps control your weight and blood pressure; helps you relax and can improve your mood.  Participate in a regular exercise program.  Talk with your doctor about the best form of exercise for you (dancing, walking, swimming, cycling).  DIET/WEIGHT Goal is to maintain a healthy weight  Your discharge diet is:  Diet Carb Modified Fluid consistency: Thin; Room service appropriate? Yes  liquids Your height is:   Your current weight is:  Your Body Mass Index (BMI) is:    Following the type of diet specifically designed for you will help prevent another stroke.  Your goal weight is:    Your goal Body Mass Index (BMI) is 19-24.  Healthy food habits can help reduce 3 risk factors for stroke:  High cholesterol, hypertension, and excess weight.  RESOURCES Stroke/Support Group:  Call [phone]   STROKE EDUCATION PROVIDED/REVIEWED AND GIVEN TO PATIENT Stroke warning signs and symptoms How to activate emergency medical system (call 911). Medications prescribed at discharge. Need for follow-up after discharge. Personal risk factors for stroke. Pneumonia vaccine given:  Flu vaccine given:  My questions have been answered, the writing is legible, and I understand these instructions.  I will adhere to these goals & educational materials that have been provided to me after my discharge from the hospital.     My questions have been answered and I understand these instructions. I will adhere to these goals and the provided educational materials after my discharge from the hospital.  Patient/Caregiver Signature _______________________________ Date __________  Clinician Signature _______________________________________ Date __________  Please bring this form and your medication list with you to all your follow-up doctor's appointments.

## 2016-05-26 NOTE — Progress Notes (Signed)
Physical Therapy Discharge Summary  Patient Details  Name: Chloe Owens MRN: [id number] Date of Birth: [birth year]  Today's Date: 05/26/2016   Patient has met 9 of 10 long term goals due to improved activity tolerance, improved balance, improved postural control, increased strength, decreased pain, ability to compensate for deficits and functional use of  left upper extremity and left lower extremity.  Patient to discharge at an ambulatory level [place].   Patient's care partner is independent to provide the necessary physical and cognitive assistance at discharge.  Reasons goals not met: Anticipatory awareness goal unmet; requires minA  Recommendation:  Patient will benefit from ongoing skilled PT services in home health setting to continue to advance safe functional mobility, address ongoing impairments in strength, coordination, activity tolerance, balance, and minimize fall risk.  Equipment: Transport chair  Reasons for discharge: treatment goals met and discharge from hospital  Patient/family agrees with progress made and goals achieved: Yes  PT Discharge Precautions/RestrictionsPrecautions Precautions: Fall Precaution Comments: L lateral lean in standing, poor L foot clearance Restrictions Weight Bearing Restrictions: No Pain Pain Assessment Pain Assessment: No/denies pain Vision/Perception    Wears glasses reading only; no change from baseline L inattention Cognition Overall Cognitive Status: History of cognitive impairments - at baseline (pt at baseline cognitively per spouse) Arousal/Alertness: Awake/alert Orientation Level: Oriented X4 Attention: Sustained;Alternating;Divided Alternating Attention: Impaired Alternating Attention Impairment: Functional complex;Verbal complex Divided Attention: Impaired Divided Attention Impairment: Functional basic;Verbal basic Memory: Impaired Memory Impairment: Decreased recall of new information Awareness:  Impaired Awareness Impairment: Emergent impairment Problem Solving: Impaired Problem Solving Impairment: Functional basic Behaviors: Poor frustration tolerance;Restless Safety/Judgment: Impaired Sensation Sensation Light Touch: Impaired Detail Light Touch Impaired Details: Impaired LLE;Impaired LUE Proprioception: Impaired Detail Proprioception Impaired Details: Impaired LLE;Impaired LUE Coordination Gross Motor Movements are Fluid and Coordinated: No Heel Shin Test: slow and decreased excursion LLE Motor  Motor Motor: Hemiplegia;Abnormal tone;Abnormal postural alignment and control Motor - Discharge Observations: hypertonicity LUE/LLE, forward flexed posture  Mobility Bed Mobility Bed Mobility: Supine to Sit;Sit to Supine Supine to Sit: 5: Supervision Supine to Sit Details: Verbal cues for technique Sit to Supine: 5: Supervision Sit to Supine - Details: Verbal cues for technique Transfers Transfers: Yes Stand Pivot Transfers: 4: Min guard;4: Min assist;With armrests Stand Pivot Transfer Details: Verbal cues for technique;Tactile cues for weight shifting;Verbal cues for precautions/safety;Manual facilitation for weight shifting Stand Pivot Transfer Details (indicate cue type and reason): with RW; cues for R weight shift to improve LLE clearance Locomotion  Ambulation Ambulation: Yes Ambulation/Gait Assistance: 4: Min guard;4: Min assist Ambulation Distance (Feet): 160 Feet Assistive device: Rolling walker;Other (Comment) (L hand splint) Ambulation/Gait Assistance Details: Verbal cues for technique;Tactile cues for weight shifting;Manual facilitation for weight shifting;Verbal cues for gait pattern;Verbal cues for precautions/safety Ambulation/Gait Assistance Details: assist/cueing for weight shifting to R to improve LLE clearance Gait Gait: Yes Gait Pattern: Impaired Gait Pattern: Narrow base of support;Poor foot clearance - left;Lateral hip instability;Trunk flexed;Left  foot flat;Left genu recurvatum;Decreased hip/knee flexion - left;Step-to pattern Gait velocity: decreased for age/gender norms Stairs / Additional Locomotion Stairs: Yes Stairs Assistance: 5: Supervision;4: Min assist (S with B handrails, minA no rails) Stairs Assistance Details: Verbal cues for sequencing;Verbal cues for precautions/safety;Verbal cues for technique;Verbal cues for gait pattern;Manual facilitation for weight shifting;Tactile cues for weight shifting Stair Management Technique: Two rails;Step to pattern;Forwards Number of Stairs: 8 Height of Stairs: 3 Ramp: 4: Min assist Curb: 4: Min Administrator Mobility: No  Trunk/Postural Assessment  Cervical Assessment Cervical Assessment:  Within Functional Limits Thoracic Assessment Thoracic Assessment: Exceptions to [facility] (increased thoracic kyphosis, reducible with cueing) Lumbar Assessment Lumbar Assessment: Exceptions to [facility] (reduced lumbar lordosis) Postural Control Postural Control: Deficits on evaluation (delayed/inefficient stepping and righting reactions)  Balance Balance Balance Assessed: Yes Standardized Balance Assessment Standardized Balance Assessment: Timed Up and Go Test Timed Up and Go Test TUG: Normal TUG Normal TUG (seconds): 34 (RW and minA) Static Sitting Balance Static Sitting - Level of Assistance: 6: Modified independent (Device/Increase time) Dynamic Sitting Balance Dynamic Sitting - Level of Assistance: 6: Modified independent (Device/Increase time) Static Standing Balance Static Standing - Level of Assistance: 5: Stand by assistance Dynamic Standing Balance Dynamic Standing - Level of Assistance: 5: Stand by assistance Extremity Assessment  RUE Assessment RUE Assessment: Within Functional Limits LUE Assessment LUE Assessment: Exceptions to [facility] LUE Strength LUE Overall Strength Comments: 4/5 shoulder and elbow, gross grasp, increased tone RLE Assessment RLE Assessment:  Within Functional Limits (4/5 hip flexion, remainder 4+/5 to 5/5 throughout) LLE Assessment LLE Assessment: Exceptions to [facility] LLE Strength LLE Overall Strength Comments: hip flexors 4-/5, knee extensors 4+/5, knee flexors 4/5, ankle DF 4+/5, ankle PF 4+/5   See Function Navigator for Current Functional Status.  Benjiman Core Tygielski 05/26/2016, 9:32 AM

## 2016-05-26 NOTE — Progress Notes (Signed)
Occupational Therapy Discharge Summary  Patient Details  Name: Chloe Owens MRN: [id number] Date of Birth: 25-Mar-1956    Patient has met 89 of 11 long term goals due to improved activity tolerance, improved balance, postural control, functional use of  LEFT upper extremity, improved attention, improved awareness and improved coordination.  Patient to discharge at [facility] Assist level.  Patient's care partner is independent to provide the necessary physical and cognitive assistance at discharge.  Pt's husband has completed caregiver training including providing assist for tub/shower transfers using tub transfer bench, bathing/dressing tasks, fine motor HEP, as well as for cognitive/ initiation needs. Pt's husband voiced willing and ableness to provide all needed assist at d/Owens.  Extensive education provided regarding pt's need for min A with all mobility, importance of participation with ADLs, benefits of OOB, stroke recovery and reducing caregiver burden. Recommendation made for use of BSC at bedside at night only and pt should ambulate into bathroom during the day for toileting needs for increased activity.   Recommendation:  Patient will benefit from ongoing skilled OT services in home health setting to continue to advance functional skills in the area of BADL and Reduce care partner burden.  Equipment: Tub transfer bench and BSC  Reasons for discharge: treatment goals met and discharge from hospital  Patient/family agrees with progress made and goals achieved: Yes  OT Discharge Precautions/Restrictions  Precautions Precautions: Fall Precaution Comments: L lateral lean in standing, poor L foot clearance Restrictions Weight Bearing Restrictions: No Vision/Perception  Vision- History Baseline Vision/History: Wears glasses Wears Glasses: Reading only Patient Visual Report: No change from baseline  Cognition Overall Cognitive Status: History of cognitive impairments - at baseline  (Cogniton at baseline per pt's husband) Arousal/Alertness: Awake/alert Orientation Level: Oriented X4 Attention: Sustained;Alternating;Divided Sustained Attention: Appears intact Alternating Attention: Impaired Alternating Attention Impairment: Functional complex;Verbal complex Divided Attention: Impaired Divided Attention Impairment: Functional basic;Verbal basic Memory: Impaired Memory Impairment: Decreased recall of new information Awareness: Impaired Awareness Impairment: Emergent impairment Problem Solving: Impaired Problem Solving Impairment: Functional basic;Verbal basic Behaviors: Poor frustration tolerance;Restless Safety/Judgment: Impaired Sensation Sensation Light Touch: Impaired Detail Light Touch Impaired Details: Impaired LLE;Impaired LUE Proprioception: Impaired Detail Proprioception Impaired Details: Impaired LLE;Impaired LUE Coordination Gross Motor Movements are Fluid and Coordinated: No Fine Motor Movements are Fluid and Coordinated: No Coordination and Movement Description: Decreased speed/ coordinaton with finger opposition for all digits Heel Shin Test: slow and decreased excursion LLE Motor  Motor Motor: Hemiplegia;Abnormal tone;Abnormal postural alignment and control;Ataxia Motor - Discharge Observations: hypertonicity LUE/LLE, forward flexed posture Mobility  Bed Mobility Bed Mobility: Supine to Sit;Sit to Supine Supine to Sit: 5: Supervision Supine to Sit Details: Verbal cues for technique Sit to Supine: 5: Supervision Sit to Supine - Details: Verbal cues for technique  Trunk/Postural Assessment  Cervical Assessment Cervical Assessment: Within Functional Limits Thoracic Assessment Thoracic Assessment: Exceptions to [facility] (Kyphotic) Lumbar Assessment Lumbar Assessment: Exceptions to [facility] (Posterior pelvic tilt) Postural Control Postural Control: Deficits on evaluation  Balance Balance Balance Assessed: Yes Dynamic Sitting Balance Dynamic  Sitting - Level of Assistance: 6: Modified independent (Device/Increase time) Static Standing Balance Static Standing - Level of Assistance: 5: Stand by assistance Dynamic Standing Balance Dynamic Standing - Level of Assistance: 5: Stand by assistance;4: Min assist Extremity/Trunk Assessment RUE Assessment RUE Assessment: Within Functional Limits LUE Assessment LUE Assessment: Exceptions to [facility] LUE Strength LUE Overall Strength Comments: 4/5 shoulder and elbow, gross grasp, increased tone   See Function Navigator for Current Functional Status.  Chloe Owens, Chloe Owens  05/26/2016, 12:55 PM

## 2016-05-26 NOTE — Progress Notes (Signed)
Physical Therapy Session Note  Patient Details  Name: Chloe Owens MRN: [id number] Date of Birth: Mar 19, 1956  Today's Date: 05/26/2016 PT Individual Time: [id number] and 1445-1520 PT Individual Time Calculation (min): 55 min and 35 min (total 90 min)    Short Term Goals: Week 2:  PT Short Term Goal 1 (Week 2): =LTG due to estimated LOS  Skilled Therapeutic Interventions/Progress Updates:   Tx 1: Pt received supine in bed, denies pain and agreeable to treatment. Supine>sit with S and bed flat. Pt dons socks/shoes on EOB with minA for threading toes into L sock and tieing shoes; moderate encouragement required for pt to attempt activities without assist. Stand pivot transfer bed>w/c>mat table without AD and minA, cues for upright posture. Standing balance on red foam wedge for gastroc/soleus stretch, anterior tib facilitation for ankle strategy. Performed statically, progressed to no UE support, then with RUE reaching R/L to retrieve and place clothespins. With L lateral reaching pt begins to lose balance every time, stating "I'm falling", but with repetition, able to performing righting reaction and weight shift to R side and maintain balance without assist. Assessed TUG with time of 34 seconds, 5 time sit to stand time of 25 seconds; both demonstrate high risk of falls. Pt educated on high fall risk and recommendation that pt use RW and have husband assisting when ambulating, transferring and pt verbalizes understanding. Performed ascent/descent of 8 3" stairs with R handrail and minA; required verbal/tactile cueing for R weight shift to improve LLE clearance. Note better/safer performance when ascending with LLE due to better weight shift to R side. Setup at sink pt performed oral hygiene with setupA. Pt left ambulating into bathroom with RW and husband providing minA; safety plan updated to reflect husband checked off to assist pt in/out of bathroom.   Tx 2: Pt received asleep in bed, denies pain  and agreeable to treatment. Bed mobility with S and increased time. Transfer w/c <>bed with RW and min guard. Car transfer performed minA without AD; close S and cueing with RW. Pt ambulated on ramp and uneven mulch surface with RW and min guard. Gait in/out of bathroom with RW and min guard, cueing for R weight shift. Pt required assist to pull up pants/brief on L side after toileting. Returned to bed for rest break. Pt reporting she is unable to participate further due to significant fatigue. Pt remained supine in bed at end of session, all needs in reach.   Therapy Documentation Precautions:  Precautions Precautions: Fall Precaution Comments: L lateral lean in standing, poor L foot clearance Restrictions Weight Bearing Restrictions: No General: PT Amount of Missed Time (min): 25 Minutes PT Missed Treatment Reason: Patient fatigue Pain: Pain Assessment Pain Assessment: No/denies pain   See Function Navigator for Current Functional Status.   Therapy/Group: Individual Therapy  Chloe Owens 05/26/2016, 9:32 AM

## 2016-05-26 NOTE — Progress Notes (Signed)
Occupational Therapy Session Note  Patient Details  Name: Chloe Owens J Fluke MRN: [id number] Date of Birth: February 07, 1956  Today's Date: 05/26/2016 OT Individual Time: 0900-1000 and 1130-1200 OT Individual Time Calculation (min): 60 min and 30 min    Short Term Goals:Week 2:  OT Short Term Goal 1 (Week 2): STG=LTG due to ELOS  Skilled Therapeutic Interventions/Progress Updates:    Session One: Pt seen for OT ADL bathing/dressing session. Pt on toilet upon arrival wth husband present providing care. Pt agreeable to showering in ADL apartment. Pt's husband provided all assist and VCs throughout session for pt to ambulate and complete shower/tub transfer utilizing tub transfer bench. Throughout session, pt required min A for all standing balance, VCs for hand placement on RW and sequencing of task and encouragement for participation/ motivation with ADL tasks. She bathed and dressed with overall min A, VCs for hemi dressing technique. See functional navigator for assist details. Pt returned to room via w/c total A for time/ energy conservation. She was provided with elastic shoe laces and worked on unlacing/lacing shoes for fine motor training with L UE.  Pt left seated in w/c at end of session, husband present. Throughout session educated regarding importance of participation/ independence with ADLs, fall risk, stroke recovery, and d/c planning.  Pt's husband displays ability to provide all needed physical and cognitive assist for pt with ADL tasks.  Session Two: Pt seen for OT session focusing on ADL re-training and establishment of HEP. Pt finishing toileting task with assist from husband, some VCs Provided for caregivers position due to pt's L lean. Pt completed fine motor task at table top level focusing on HEP using theraputty, resistance sponge, and commonly used household items (cups, etc). Encouragement required for all tasks. Cont to educate regarding importance of participation in tasks,  incorportating therapy into every day tasks, continuum of care and d/c planning.   Therapy Documentation Precautions:  Precautions Precautions: Fall Restrictions Weight Bearing Restrictions: No  See Function Navigator for Current Functional Status.   Therapy/Group: Individual Therapy  Lewis, Dahlia Nifong C 05/26/2016, 6:42 AM

## 2016-05-27 LAB — GLUCOSE, CAPILLARY: Glucose-Capillary: 108 mg/dL — ABNORMAL HIGH (ref 65–99)

## 2016-05-27 NOTE — Progress Notes (Signed)
[place] PHYSICAL MEDICINE & REHABILITATION     PROGRESS NOTE   Subjective/Complaints: Pt sitting up in bed this AM.  She slept well overnight.  She appears more alert and interactive this AM.  She is ready for discharge, but anticipates that she will need something in the future for pain.   ROS: Denies CP, SOB, N/V/D.  Objective: Vital Signs: Blood pressure (!) 161/83, pulse 88, temperature 98 F (36.7 C), temperature source Oral, resp. rate 18, height 5\' 8"  (1.727 m), weight 75.7 kg (166 lb 12.8 oz), SpO2 98 %. No results found. No results for input(s): WBC, HGB, HCT, PLT in the last 72 hours.  Recent Labs  05/24/16 1213 05/26/16 1237  NA 136  --   K 3.6  --   CL 102  --   GLUCOSE 373* 414*  BUN 14  --   CREATININE 1.19*  --   CALCIUM 9.9  --    CBG (last 3)   Recent Labs  05/26/16 1903 05/26/16 2031 05/27/16 0644  GLUCAP 199* 226* 108*    Wt Readings from Last 3 Encounters:  05/24/16 75.7 kg (166 lb 12.8 oz)  05/23/14 72.6 kg (160 lb)  05/09/12 82.1 kg (181 lb)    Physical Exam:  Constitutional: NAD. Vital signs reviewed. HENT: Normocephalic and atraumatic.  Eyes: No discharge.  Cardiovascular: RRR. No JVD. Respiratory: clear. Unlabored GI: Soft. Bowel sounds are normal.  Musculoskeletal: She exhibits no edema or tenderness.  Neurological: She is alert.  Left facial weakness/central 7.  Motor: RUE/RLE: 4+/5 proximal to distal. No tremor today.  LUE/LLE: 4--4/5 proximal to distal (stable).  Sensation intact to light touch   Skin: Skin is warm and dry. No sacral breakdown Psychiatric: Her affect is flat Her speech is delayed.    Assessment/Plan: 1. Functional, cognitive, and mobility deficits secondary to right MCA infarct which require 3+ hours per day of interdisciplinary therapy in a comprehensive inpatient rehab setting. Physiatrist is providing close team supervision and 24 hour management of active medical problems listed below. Physiatrist and  rehab team continue to assess barriers to discharge/monitor patient progress toward functional and medical goals.  Function:  Bathing Bathing position   Position: Shower  Bathing parts Body parts bathed by patient: Right arm, Chest, Abdomen, Right upper leg, Left upper leg, Left arm, Right lower leg, Left lower leg, Front perineal area, Buttocks Body parts bathed by helper: Back  Bathing assist Assist Level: Touching or steadying assistance(Pt > 75%)      Upper Body Dressing/Undressing Upper body dressing   What is the patient wearing?: Pull over shirt/dress Bra - Perfomed by patient: Thread/unthread right bra strap, Thread/unthread left bra strap Bra - Perfomed by helper: Hook/unhook bra (pull down sports bra) Pull over shirt/dress - Perfomed by patient: Thread/unthread right sleeve, Put head through opening, Pull shirt over trunk, Thread/unthread left sleeve Pull over shirt/dress - Perfomed by helper: Thread/unthread left sleeve        Upper body assist Assist Level: Supervision or verbal cues   Set up : To obtain clothing/put away  Lower Body Dressing/Undressing Lower body dressing   What is the patient wearing?: Underwear, Pants, Socks, Shoes Underwear - Performed by patient: Thread/unthread right underwear leg, Thread/unthread left underwear leg, Pull underwear up/down Underwear - Performed by helper: Pull underwear up/down Pants- Performed by patient: Thread/unthread right pants leg, Pull pants up/down, Thread/unthread left pants leg Pants- Performed by helper: Thread/unthread left pants leg Non-skid slipper socks- Performed by patient: Don/doff right  sock, Don/doff left sock Non-skid slipper socks- Performed by helper: Don/doff right sock, Don/doff left sock Socks - Performed by patient: Don/doff right sock, Don/doff left sock Socks - Performed by helper: Don/doff right sock Shoes - Performed by patient: Don/doff right shoe, Don/doff left shoe (Elastic shoelaces) Shoes -  Performed by helper: Fasten right, Fasten left          Lower body assist Assist for lower body dressing: Touching or steadying assistance (Pt > 75%)      Toileting Toileting Toileting activity did not occur: Refused Toileting steps completed by patient: Adjust clothing prior to toileting, Performs perineal hygiene, Adjust clothing after toileting Toileting steps completed by helper: Adjust clothing prior to toileting, Performs perineal hygiene, Adjust clothing after toileting Toileting Assistive Devices: Grab bar or rail  Toileting assist Assist level: Touching or steadying assistance (Pt.75%)   Transfers Chair/bed transfer   Chair/bed transfer method: Stand pivot Chair/bed transfer assist level: Supervision or verbal cues Chair/bed transfer assistive device: Patent attorney     Max distance: 175 Assist level: Touching or steadying assistance (Pt > 75%)   Wheelchair   Type: Manual Max wheelchair distance: 50 ft Assist Level: Touching or steadying assistance (Pt > 75%)  Cognition Comprehension Comprehension assist level: Understands basic 90% of the time/cues < 10% of the time  Expression Expression assist level: Expresses basic 90% of the time/requires cueing < 10% of the time.  Social Interaction Social Interaction assist level: Interacts appropriately 75 - 89% of the time - Needs redirection for appropriate language or to initiate interaction.  Problem Solving Problem solving assist level: Solves basic 75 - 89% of the time/requires cueing 10 - 24% of the time  Memory Memory assist level: Recognizes or recalls 50 - 74% of the time/requires cueing 25 - 49% of the time   Medical Problem List and Plan: 1.  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  D/c today  Pt to follow up with Dr. Riley Kill as outpt for transitional care management in 1-2 weeks.  2.  DVT Prophylaxis/Anticoagulation: Pharmaceutical:  Lovenox 3. Pain Management:   4. Mood: LCSW to follow for evaluation and support.  5. Neuropsych: This patient is not fully capable of making decisions on her own behalf. 6. Skin/Wound Care: routine pressure relief measures.  7. Fluids/Electrolytes/Nutrition: Monitor I/O. 8. HTN:    Lisinopril 10 started 12/21 (home 20)  Improving overall, but elevated this AM.    Will likely need further ambulatory adjustments.  9. T2DM:    Home regimen reviewed.  Discussed with pt and she states she would really like to go home.  She and husband state they check CBGs >4/day.  Encouraged pt to follow up with PCP or PM&R if continues to labile on home reg.  CBG (last 3)   Recent Labs  05/26/16 1903 05/26/16 2031 05/27/16 0644  GLUCAP 199* 226* 108*  10 Anxiety/depression: team to provide ego support. She was on Abilify, Klonopin and Wellbutrin at home.   Klonopin resumed.     Rresumed low dose Wellbutrin..   Abilify 15mg  qhs resumed on 12/19 due to increased lability per husband  continue prn xanax for breakthrough anxiety   11. Dyslipidemia: On Lipitor.    12. Nausea: improved  continue prilosec  Anxiety may be a component.    -ua equivocal--ucx with multispecies 13. AKI  Cr. 1.19 on 12/20  Cont to encourage fluid intake 14. Constipation  Bowel reg  increased 12/18  Improving 15. ABLA  Hb 12.6 on 12/20  Cont to monitor  LOS (Days) 17 A FACE TO FACE EVALUATION WAS PERFORMED  Chloe Owens Karis Jubanil Shahir Karen 05/27/2016 8:48 AM

## 2016-05-27 NOTE — Progress Notes (Signed)
Patient discharged home with husband about [id number]. Patient and husband given all discharge instructions. All questions answered. Patient alert and oriented,denies pain. All belongings and discharge instructions sent with patient.

## 2016-05-31 ENCOUNTER — Telehealth: Payer: Self-pay | Admitting: *Deleted

## 2016-05-31 DIAGNOSIS — R519 Headache, unspecified: Secondary | ICD-10-CM

## 2016-05-31 DIAGNOSIS — R112 Nausea with vomiting, unspecified: Secondary | ICD-10-CM | POA: Diagnosis not present

## 2016-05-31 DIAGNOSIS — R51 Headache: Secondary | ICD-10-CM

## 2016-05-31 NOTE — Telephone Encounter (Signed)
Contact made with husband.  Transitional care call   1. Are you/is patient experiencing any problems since coming home? Are there any questions regarding any aspect of care? No 2. Are there any questions regarding medications administration/dosing? Are meds being taken as prescribed? Patient should review meds with caller to confirm no questions, has all of medications 3. Have there been any falls? No falls 4. Has Home Health been to the house and/or have they contacted you? If not, have you tried to contact them? Can we help you contact them? They have been out 5. Are bowels and bladder emptying properly? Are there any unexpected incontinence issues? If applicable, is patient following bowel/bladder programs? No, "we did have to take a specimen back over to primary care to check for UTI due to running a fever yesterday of 100.8 6. Any fevers, problems with breathing, unexpected pain? Yes fever. Followed up with PCP. Urine test submitted. Has been a little nauseated 7. Are there any skin problems or new areas of breakdown? No 8. Has the patient/family member arranged specialty MD follow up (ie cardiology/neurology/renal/surgical/etc)?  Can we help arrange? Saw PCP yesterday. Made appt with Allena KatzPatel today and will follow with Riley KillSwartz thereafter 9. Does the patient need any other services or support that we can help arrange? No 10. Are caregivers following through as expected in assisting the patient? Yes 11. Has the patient quit smoking, drinking alcohol, or using drugs as recommended? N/A  Appointment with Dr Allena KatzPatel 06/09/16 @ 9:40 arrive by 9:20.  Follow up with Riley KillSwartz thereafter Address reviewed: [address]

## 2016-06-07 ENCOUNTER — Telehealth: Payer: Self-pay | Admitting: *Deleted

## 2016-06-07 NOTE — Telephone Encounter (Signed)
[facility] called for OT POC 2wk6, 1wk1.  Approval given.

## 2016-06-09 ENCOUNTER — Encounter: Payer: Self-pay | Admitting: Physical Medicine & Rehabilitation

## 2016-06-09 ENCOUNTER — Encounter: Payer: Medicare Other | Attending: Physical Medicine & Rehabilitation | Admitting: Physical Medicine & Rehabilitation

## 2016-06-09 VITALS — BP 147/89 | HR 97 | Resp 14

## 2016-06-09 DIAGNOSIS — G8194 Hemiplegia, unspecified affecting left nondominant side: Secondary | ICD-10-CM

## 2016-06-09 DIAGNOSIS — F419 Anxiety disorder, unspecified: Secondary | ICD-10-CM | POA: Insufficient documentation

## 2016-06-09 DIAGNOSIS — I6930 Unspecified sequelae of cerebral infarction: Secondary | ICD-10-CM

## 2016-06-09 DIAGNOSIS — G8114 Spastic hemiplegia affecting left nondominant side: Secondary | ICD-10-CM

## 2016-06-09 DIAGNOSIS — F329 Major depressive disorder, single episode, unspecified: Secondary | ICD-10-CM | POA: Insufficient documentation

## 2016-06-09 DIAGNOSIS — I639 Cerebral infarction, unspecified: Secondary | ICD-10-CM

## 2016-06-09 DIAGNOSIS — IMO0002 Reserved for concepts with insufficient information to code with codable children: Secondary | ICD-10-CM

## 2016-06-09 DIAGNOSIS — R251 Tremor, unspecified: Secondary | ICD-10-CM | POA: Insufficient documentation

## 2016-06-09 DIAGNOSIS — I69351 Hemiplegia and hemiparesis following cerebral infarction affecting right dominant side: Secondary | ICD-10-CM | POA: Insufficient documentation

## 2016-06-09 DIAGNOSIS — I10 Essential (primary) hypertension: Secondary | ICD-10-CM | POA: Insufficient documentation

## 2016-06-09 DIAGNOSIS — E119 Type 2 diabetes mellitus without complications: Secondary | ICD-10-CM | POA: Insufficient documentation

## 2016-06-09 DIAGNOSIS — R7309 Other abnormal glucose: Secondary | ICD-10-CM

## 2016-06-09 DIAGNOSIS — Z8673 Personal history of transient ischemic attack (TIA), and cerebral infarction without residual deficits: Secondary | ICD-10-CM

## 2016-06-09 DIAGNOSIS — I69322 Dysarthria following cerebral infarction: Secondary | ICD-10-CM | POA: Diagnosis not present

## 2016-06-09 DIAGNOSIS — F4323 Adjustment disorder with mixed anxiety and depressed mood: Secondary | ICD-10-CM | POA: Diagnosis not present

## 2016-06-09 DIAGNOSIS — F411 Generalized anxiety disorder: Secondary | ICD-10-CM

## 2016-06-09 DIAGNOSIS — I69319 Unspecified symptoms and signs involving cognitive functions following cerebral infarction: Secondary | ICD-10-CM

## 2016-06-09 DIAGNOSIS — F331 Major depressive disorder, recurrent, moderate: Secondary | ICD-10-CM

## 2016-06-09 MED ORDER — BACLOFEN 10 MG PO TABS: 10.0000 mg | ORAL_TABLET | Freq: Three times a day (TID) | ORAL | 1 refills | Status: DC

## 2016-06-09 NOTE — Progress Notes (Signed)
Subjective:    Patient ID: Chloe Owens, female    DOB: 11/16/1955, 61 y.o.   MRN: [id number]  HPI 61 year old female with history of T2DM, HTN, BUE tremors, CVA 2009 with memory deficits  who presents for transitional care management after receiving CIR after right MCA infarct with history of CVA.  Admit date: 05/10/2016 Discharge date: 05/27/2016  She does not remember me.  Presents with her husband, who provides all the history. She saw her PCP, her BP remains elevated. Her CBGs are still very labile and she is working with her PCP for adjustments. Her Klonopin was increased by her PCP.  Her nausea has returned, but husband states it goes away with multivitamin.  Bowel movements are back normal.  She is scheduled to have blood work with her PCP.   DME: shower bench, bedside commode.  Therapies: 2/week.  Mobility: Walker in home  Pain Inventory Average Pain 0 Pain Right Now 0 My pain is no pain  In the last 24 hours, has pain interfered with the following? General activity 0 Relation with others 0 Enjoyment of life 0 What TIME of day is your pain at its worst? no pain Sleep (in general) Poor  Pain is worse with: no pain Pain improves with: no pain Relief from Meds: no pain  Mobility walk with assistance use a walker how many minutes can you walk? 5 ability to climb steps?  yes do you drive?  no use a wheelchair needs help with transfers Do you have any goals in this area?  yes  Function not employed: date last employed 05/27/2008 disabled: date disabled 05/27/2008 retired I need assistance with the following:  dressing, bathing, toileting, meal prep, household duties and shopping  Neuro/Psych bladder control problems bowel control problems weakness tremor trouble walking confusion depression anxiety  Prior Studies Any changes since last visit?  no  Physicians involved in your care Any changes since last visit?  no   Family History  Problem  Relation Age of Onset  . CAD Father   . CAD Mother   . Cancer - Other Mother     Breast  . Cancer - Other Brother     Unknown type   Social History   Social History  . Marital status: Married    Spouse name: N/A  . Number of children: N/A  . Years of education: N/A   Social History Main Topics  . Smoking status: Current Every Day Smoker    Packs/day: 1.00    Types: Cigarettes    Last attempt to quit: 04/15/2012  . Smokeless tobacco: Never Used  . Alcohol use No  . Drug use: No  . Sexual activity: No   Other Topics Concern  . None   Social History Narrative  . None   Past Surgical History:  Procedure Laterality Date  . ANKLE CLOSED REDUCTION  04/15/2012   Procedure: CLOSED REDUCTION ANKLE;  Surgeon: Senaida LangeKevin M Supple, MD;  Location: [facility] OR;  Service: Orthopedics;  Laterality: Left;  . CARPAL TUNNEL RELEASE     bilaterally  . ORIF ANKLE FRACTURE  05/09/2012   Procedure: OPEN REDUCTION INTERNAL FIXATION (ORIF) ANKLE FRACTURE;  Surgeon: Toni ArthursJohn Hewitt, MD;  Location: [facility] OR;  Service: Orthopedics;  Laterality: Left;  ORIF VERSES EXFIX LEFT ANKLE FRACTURE   Past Medical History:  Diagnosis Date  . Anxiety   . Bladder infection    hx of  . Depression   . Diabetes mellitus without complication (HCC)   .  GERD (gastroesophageal reflux disease)   . Hyperlipidemia   . Hypertension   . Neuromuscular disorder (HCC)   . Stroke (HCC)    05/2008   BP (!) 147/89   Pulse 97   Resp 14   SpO2 97%   Opioid Risk Score:   Fall Risk Score:  `1  Depression screen PHQ 2/9  Depression screen PHQ 2/9 06/09/2016  Decreased Interest 3  Down, Depressed, Hopeless 3  PHQ - 2 Score 6  Altered sleeping 3  Tired, decreased energy 3  Change in appetite 2  Feeling bad or failure about yourself  2  Trouble concentrating 1  Moving slowly or fidgety/restless 3  Suicidal thoughts 0  PHQ-9 Score 20  Difficult doing work/chores Very difficult    Review of Systems  Constitutional: Positive  for appetite change, chills, diaphoresis and fever.  HENT: Negative.   Eyes: Negative.   Respiratory: Negative.   Cardiovascular: Negative.   Gastrointestinal: Positive for abdominal pain, constipation and nausea.  Endocrine: Negative.        High and low blood sugar  Genitourinary: Positive for frequency and urgency.       Incontinence  Musculoskeletal: Positive for gait problem.  Skin: Negative.   Allergic/Immunologic: Negative.   Neurological: Positive for tremors and weakness.  Psychiatric/Behavioral: Positive for confusion and dysphoric mood. The patient is nervous/anxious.       Objective:   Physical Exam Constitutional: NAD. Vital signs reviewed. HENT: Normocephalic and atraumatic.  Eyes: EOMI. No discharge.  Cardiovascular: RRR. No JVD. Respiratory: Clear. Unlabored GI: Soft. Bowel sounds are normal.  Musculoskeletal: She exhibits no edema or tenderness.  Neurological: She is alert.  Left facial weakness.  Motor: RUE/RLE: 4/5 proximal to distal.  RLE resting tremor.  LUE/LLE: 4-/5 proximal to distal.  MAS: 1+/4 Left elbow flexors Sensation intact to light touch   Skin: Skin is warm and dry. Intact.  Psychiatric: Her affect is flat Her speech is delayed.      Assessment & Plan:  61 year old female with history of T2DM, HTN, BUE tremors, CVA 2009 with memory deficits  who presents for transitional care management after receiving CIR after right MCA infarct with history of CVA.  1.  Cognitive deficits, dysarthria, hemiparesis secondary to right MCA infarct with history of CVA, with spastic hemiparesis.  Cont meds  Cont therapies  Follow up Neurology  Will order baclofen 5 TID, may increase to 10 in 3 days if no side effects. Educated on Botox, would like to try oral medications first.  Cont ROM, ?improving  2.  Pain Management  Controlled at present  3. HTN  Remains elevated  Educated on follow up with PCP for further adjustment of medications  4.  T2DM  Remains extremely labile with hypoglycemia of 30-40  Instructed to decrease insulin and follow up with PCP for further adjustment  5. Anxiety/depression  Meds per PCP, recently increased    Meds reviewed Referals reviewed All questions answered

## 2016-06-20 ENCOUNTER — Other Ambulatory Visit: Payer: Self-pay | Admitting: *Deleted

## 2016-06-20 MED ORDER — SENNOSIDES-DOCUSATE SODIUM 8.6-50 MG PO TABS: 2.0000 | ORAL_TABLET | Freq: Every day | ORAL | 3 refills | Status: AC

## 2016-06-26 DIAGNOSIS — R112 Nausea with vomiting, unspecified: Secondary | ICD-10-CM | POA: Diagnosis not present

## 2016-06-26 DIAGNOSIS — I10 Essential (primary) hypertension: Secondary | ICD-10-CM | POA: Diagnosis not present

## 2016-06-26 DIAGNOSIS — E782 Mixed hyperlipidemia: Secondary | ICD-10-CM | POA: Diagnosis not present

## 2016-06-26 DIAGNOSIS — E1165 Type 2 diabetes mellitus with hyperglycemia: Secondary | ICD-10-CM | POA: Diagnosis not present

## 2016-06-26 DIAGNOSIS — G8114 Spastic hemiplegia affecting left nondominant side: Secondary | ICD-10-CM | POA: Diagnosis not present

## 2016-06-27 ENCOUNTER — Ambulatory Visit (INDEPENDENT_AMBULATORY_CARE_PROVIDER_SITE_OTHER): Payer: Medicare Other | Admitting: Neurology

## 2016-06-27 ENCOUNTER — Encounter: Payer: Self-pay | Admitting: Neurology

## 2016-06-27 VITALS — BP 126/78 | HR 94

## 2016-06-27 DIAGNOSIS — I63511 Cerebral infarction due to unspecified occlusion or stenosis of right middle cerebral artery: Secondary | ICD-10-CM | POA: Diagnosis not present

## 2016-06-27 DIAGNOSIS — E785 Hyperlipidemia, unspecified: Secondary | ICD-10-CM | POA: Diagnosis not present

## 2016-06-27 DIAGNOSIS — I10 Essential (primary) hypertension: Secondary | ICD-10-CM

## 2016-06-27 DIAGNOSIS — Z720 Tobacco use: Secondary | ICD-10-CM | POA: Diagnosis not present

## 2016-06-27 DIAGNOSIS — E119 Type 2 diabetes mellitus without complications: Secondary | ICD-10-CM | POA: Diagnosis not present

## 2016-06-27 NOTE — Progress Notes (Signed)
STROKE NEUROLOGY FOLLOW UP NOTE  NAME: Chloe Owens DOB: [birth year]  REASON FOR VISIT: stroke follow up HISTORY FROM: chart and husband  Today we had the pleasure of seeing Chloe Owens in follow-up at our Neurology Clinic. Pt was accompanied by husband.   History Summary Chloe Owens is a 61 y.o. female with history of left thalamic CVA in 2009 with no residual deficits, HTN, DM2, smoker admitted on 05/07/16 for trouble walking. MRI showed acute right PLIC infarct, and old left thalamic infarct. MRA showed b/l PCA stenosis. CUS, TTE unremarkable. LDL 107 and A1C 8.2. She was put on DAPT and lipitor 40 on discharge to CIR.   Interval History During the interval time, the patient has been doing better. She stayed in CIR for 2 weeks and then discharged home with outpt PT/OT. On 06/13/2016, she had worsening left facial droop and slurry speech, sent to [facility], head CT negative for acute abnormality, transfer to [facility] for admission. Was told to have a "new stroke", however, there was no MRI imaging or report on file. Patient was later on discharge home without any medication changes. Currently patient continued to have left hemiparesis, glucose still not under control. BP today 126/78.  REVIEW OF SYSTEMS: Full 14 system review of systems performed and notable only for those listed below and in HPI above, all others are negative:  Constitutional:   Cardiovascular:  Ear/Nose/Throat:  Trouble swallowing Skin:  Eyes:   Respiratory:   Gastroitestinal:   Genitourinary: Urination problems Hematology/Lymphatic:   Endocrine: Feeling hot, feeling cold, increased thirst Musculoskeletal:   Allergy/Immunology:   Neurological:  Weakness, slurry speech, difficulty swallowing Psychiatric: Depression, anxiety, decreased energy Sleep: Snoring, restless leg  The following represents the patient's updated allergies and side effects list: Allergies  Allergen Reactions    . Codeine Nausea And Vomiting and Rash  . Penicillins Rash    The neurologically relevant items on the patient's problem list were reviewed on today's visit.  Neurologic Examination  A problem focused neurological exam (12 or more points of the single system neurologic examination, vital signs counts as 1 point, cranial nerves count for 8 points) was performed.  Blood pressure 126/78, pulse 94.  General - Well nourished, well developed, in no apparent distress.  Ophthalmologic - Fundi not visualized due to noncooperation.  Cardiovascular - Regular rate and rhythm.  Mental Status -  Level of arousal and orientation to month, place, and person were intact, but not orientated to year or age Language including expression, naming, repetition, comprehension was assessed and found intact, however, pt had mild psychomotor slowing Impaired calculation, however intact backward spelling Intact fund of knowledge  Cranial Nerves II - XII - II - Visual field intact OU. III, IV, VI - Extraocular movements intact. V - Facial sensation intact bilaterally. VII - left facial droop. VIII - Hearing & vestibular intact bilaterally. X - Palate elevates symmetrically, mild dysarthria. XI - Chin turning & shoulder shrug intact bilaterally. XII - Tongue protrusion intact.  Motor Strength - The patient's strength was normal in all extremities except LUE 3+/5, LLE 4/5 on brace and pronator drift was present on the left.  Bulk was normal and fasciculations were absent.   Motor Tone - Muscle tone was assessed at the neck and appendages and was normal.  Reflexes - The patient's reflexes were 1+ in all extremities and she had no pathological reflexes.  Sensory - Light touch, temperature/pinprick were assessed and were  symmetrical.    Coordination - The patient had normal movements in the hands with no ataxia or dysmetria.  Tremor was absent.  Gait and Station - in wheelchair, gait not  tested.  Functional score  mRS = 4   0 - No symptoms.   1 - No significant disability. Able to carry out all usual activities, despite some symptoms.   2 - Slight disability. Able to look after own affairs without assistance, but unable to carry out all previous activities.   3 - Moderate disability. Requires some help, but able to walk unassisted.   4 - Moderately severe disability. Unable to attend to own bodily needs without assistance, and unable to walk unassisted.   5 - Severe disability. Requires constant nursing care and attention, bedridden, incontinent.   6 - Dead.   NIH Stroke Scale   Level Of Consciousness 0=Alert; keenly responsive 1=Not alert, but arousable by minor stimulation 2=Not alert, requires repeated stimulation 3=Responds only with reflex movements 0  LOC Questions to Month and Age 73=Answers both questions correctly 1=Answers one question correctly 2=Answers neither question correctly 1  LOC Commands      -Open/Close eyes     -Open/close grip 0=Performs both tasks correctly 1=Performs one task correctly 2=Performs neighter task correctly 0  Best Gaze 0=Normal 1=Partial gaze palsy 2=Forced deviation, or total gaze paresis 0  Visual 0=No visual loss 1=Partial hemianopia 2=Complete hemianopia 3=Bilateral hemianopia (blind including cortical blindness) 0  Facial Palsy 0=Normal symmetrical movement 1=Minor paralysis (asymmetry) 2=Partial paralysis (lower face) 3=Complete paralysis (upper and lower face) 1  Motor  0=No drift, limb holds posture for full 10 seconds 1=Drift, limb holds posture, no drift to bed 2=Some antigravity effort, cannot maintain posture, drifts to bed 3=No effort against gravity, limb falls 4=No movement Right Arm 0     Leg 0    Left Arm 1     Leg 1  Limb Ataxia 0=Absent 1=Present in one limb 2=Present in two limbs 0  Sensory 0=Normal 1=Mild to moderate sensory loss 2=Severe to total sensory loss 0  Best Language  0=No aphasia, normal 1=Mild to moderate aphasia 2=Mute, global aphasia 3=Mute, global aphasia 0  Dysarthria 0=Normal 1=Mild to moderate 2=Severe, unintelligible or mute/anarthric 1  Extinction/Neglect 0=No abnormality 1=Extinction to bilateral simultaneous stimulation 2=Profound neglect 0  Total   5     Data reviewed: I personally reviewed the images and agree with the radiology interpretations.  Chloe Brain 43Wo Contrast Chloe Maxine GlennMra Head/brain Wo Cm 05/07/2016 Acute RIGHT MCA lenticulostriate territory infarct affecting the lentiform nucleus and posterior limb internal capsule correlates with the CT abnormality. This is nonhemorrhagic. 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 No anterior circulation stenosis or occlusion. 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  Carotid Doppler   There is 1-39% bilateral ICA stenosis. Vertebral artery flow is antegrade.   2D Echocardiogram  - Left ventricle: The cavity size was normal. There was moderateconcentric hypertrophy. Systolic function was vigorous. Theestimated ejection fraction was in the range of 65% to 70%. Wallmotion was normal; there were no regional wall motionabnormalities. Doppler parameters are consistent with abnormalleft ventricular relaxation (grade 1 diastolic dysfunction). TheE/e&' ratio is >15, suggesting elevated LV filling pressure. - Aortic valve: Sclerosis without stenosis. There was noregurgitation. - Mitral valve: Heavily calcified mitral annulus, especiallyposteriorly. Mild regurgitation. Valve area by continuityequation (using LVOT flow): 2.14 cm^2. - Left atrium: The atrium was normal in size. - Inferior vena cava: The vessel was normal in size. Therespirophasic diameter  changes were in the normal range (>= 50%),consistent with normal central venous pressure. Impressions:  Compared to a prior study in 2009, there is more heavy posteriormitral annular calcification - unclear if this could have been  asource of embolic stroke. LVEF is unchanged at 65-70%.  Component     Latest Ref Rng & Units 05/07/2016  Cholesterol     0 - 200 mg/dL 161  Triglycerides     <150 mg/dL 74  HDL Cholesterol     >40 mg/dL 59  Total CHOL/HDL Ratio     RATIO 3.1  VLDL     0 - 40 mg/dL 15  LDL (calc)     0 - 99 mg/dL 096 (H)  Hemoglobin E4V     4.8 - 5.6 % 8.2 (H)  Mean Plasma Glucose     mg/dL 409    Assessment: As you may recall, she is a 61 y.o. Caucasian female with PMH of left thalamic CVA in 2009 with no residual deficits, HTN, DM2, smoker admitted on 05/07/16 for trouble walking. MRI showed acute right PLIC infarct, and old left thalamic infarct. MRA showed b/l PCA stenosis. CUS, TTE unremarkable. LDL 107 and A1C 8.2. She was put on DAPT and lipitor 40 on discharge. Stroke likely small vessel event. On 06/13/2016, she had worsening left facial droop and slurry speech, head CT negative for acute abnormality, admitted to [facility], however, there was no MRI imaging or report on file. Patient was later on discharge home without medication changes. Currently patient continued to have left hemiparesis, glucose still not under control. Has quit smoking.  Plan:  - continue ASA and plavix for stroke prevention for another 1.5 months and then plavix alone - continue lipitor for stroke prevention - Follow up with your primary care physician for stroke risk factor modification. Recommend maintain blood pressure goal <130/80, diabetes with hemoglobin A1c goal below 7.0% and lipids with LDL cholesterol goal below 70 mg/dL.  - check BP and glucose at home and record - continue aggressive exercise with PT/OT - follow up in 4 months with Darrol Angel NP.   No orders of the defined types were placed in this encounter.   No orders of the defined types were placed in this encounter.   Patient Instructions  - continue ASA and plavix for stroke prevention for another 1.5 months and then plavix alone - continue  lipitor for stroke prevention - Follow up with your primary care physician for stroke risk factor modification. Recommend maintain blood pressure goal <130/80, diabetes with hemoglobin A1c goal below 7.0% and lipids with LDL cholesterol goal below 70 mg/dL.  - check BP and glucose at home and record - continue aggressive exercise with PT/OT - follow up in 4 months.    Marvel Plan, MD PhD [facility] [address], [address] [place], [place] [address] [phone]

## 2016-06-27 NOTE — Patient Instructions (Signed)
-   continue ASA and plavix for stroke prevention for another 1.5 months and then plavix alone - continue lipitor for stroke prevention - Follow up with your primary care physician for stroke risk factor modification. Recommend maintain blood pressure goal <130/80, diabetes with hemoglobin A1c goal below 7.0% and lipids with LDL cholesterol goal below 70 mg/dL.  - check BP and glucose at home and record - continue aggressive exercise with PT/OT - follow up in 4 months.

## 2016-07-05 ENCOUNTER — Encounter: Payer: Self-pay | Admitting: Physical Medicine & Rehabilitation

## 2016-07-05 ENCOUNTER — Encounter (HOSPITAL_BASED_OUTPATIENT_CLINIC_OR_DEPARTMENT_OTHER): Payer: Medicare Other | Admitting: Physical Medicine & Rehabilitation

## 2016-07-05 VITALS — BP 140/72 | HR 100 | Resp 14

## 2016-07-05 DIAGNOSIS — I69351 Hemiplegia and hemiparesis following cerebral infarction affecting right dominant side: Secondary | ICD-10-CM | POA: Diagnosis not present

## 2016-07-05 DIAGNOSIS — G8194 Hemiplegia, unspecified affecting left nondominant side: Secondary | ICD-10-CM

## 2016-07-05 DIAGNOSIS — I639 Cerebral infarction, unspecified: Secondary | ICD-10-CM | POA: Diagnosis not present

## 2016-07-05 DIAGNOSIS — I63511 Cerebral infarction due to unspecified occlusion or stenosis of right middle cerebral artery: Secondary | ICD-10-CM | POA: Diagnosis not present

## 2016-07-05 DIAGNOSIS — IMO0002 Reserved for concepts with insufficient information to code with codable children: Secondary | ICD-10-CM

## 2016-07-05 NOTE — Patient Instructions (Signed)
PLEASE FEEL FREE TO CALL OUR OFFICE WITH ANY PROBLEMS OR QUESTIONS [phone])     LET ME KNOW WHEN HOME THERAPY IS WINDING DOWN AND I''LL MAKE A REFERRAL TO [facility].

## 2016-07-05 NOTE — Progress Notes (Signed)
Subjective:    Patient ID: Chloe Owens, female    DOB: Sep 22, 1955, 61 y.o.   MRN: [id number]  HPI   Mrs. Radoncic iw here in follow up of her CVA. Dr. Allena Katz followed up with her earlier in the month. Apparently she had some worsening stroke like symptoms after this visit---work up was negative. Since then she's been doing fairly well. Husband stopped the baclofen as it was making her excessively drowsy.   PT is coming out to the house 2x per week. They are working left lower extremity strength and other exercises. They are also addressing her gait. Her stamina is improving. She denies any falls. Apparently therapy has requested that [employer] build her a left AFO.  SUgars have been much better controlled per husband as is BP. These are followed by primary. Sugars have been ranging form 100-150 typically.   Pain Inventory Average Pain 4 Pain Right Now 5 My pain is intermittent and aching  In the last 24 hours, has pain interfered with the following? General activity 2 Relation with others 3 Enjoyment of life 4 What TIME of day is your pain at its worst? night Sleep (in general) Fair  Pain is worse with: some activites Pain improves with: therapy/exercise Relief from Meds: 2  Mobility walk with assistance use a walker ability to climb steps?  yes do you drive?  no use a wheelchair needs help with transfers Do you have any goals in this area?  yes  Function disabled: date disabled 2009 retired I need assistance with the following:  bathing, toileting, meal prep, household duties and shopping  Neuro/Psych bladder control problems bowel control problems weakness tremor trouble walking confusion depression anxiety  Prior Studies Any changes since last visit?  no  Physicians involved in your care Any changes since last visit?  no   Family History  Problem Relation Age of Onset  . CAD Father   . CAD Mother   . Cancer - Other Mother     Breast  . Cancer - Other  Brother     Unknown type   Social History   Social History  . Marital status: Married    Spouse name: N/A  . Number of children: N/A  . Years of education: N/A   Social History Main Topics  . Smoking status: Former Smoker    Packs/day: 1.00    Types: Cigarettes    Quit date: 04/15/2012  . Smokeless tobacco: Never Used  . Alcohol use No  . Drug use: No  . Sexual activity: No   Other Topics Concern  . Not on file   Social History Narrative  . No narrative on file   Past Surgical History:  Procedure Laterality Date  . ANKLE CLOSED REDUCTION  04/15/2012   Procedure: CLOSED REDUCTION ANKLE;  Surgeon: Senaida Lange, MD;  Location: [facility] OR;  Service: Orthopedics;  Laterality: Left;  . CARPAL TUNNEL RELEASE     bilaterally  . ORIF ANKLE FRACTURE  05/09/2012   Procedure: OPEN REDUCTION INTERNAL FIXATION (ORIF) ANKLE FRACTURE;  Surgeon: Toni Arthurs, MD;  Location: [facility];  Service: Orthopedics;  Laterality: Left;  ORIF VERSES EXFIX LEFT ANKLE FRACTURE   Past Medical History:  Diagnosis Date  . Anxiety   . Bladder infection    hx of  . Depression   . Diabetes mellitus without complication (HCC)   . GERD (gastroesophageal reflux disease)   . Hyperlipidemia   . Hypertension   . Neuromuscular disorder (HCC)   .  Stroke (HCC)    05/2008   BP 140/72   Pulse 100   Resp 14   SpO2 96%   Opioid Risk Score:   Fall Risk Score:  `1  Depression screen PHQ 2/9  Depression screen PHQ 2/9 06/09/2016  Decreased Interest 3  Down, Depressed, Hopeless 3  PHQ - 2 Score 6  Altered sleeping 3  Tired, decreased energy 3  Change in appetite 2  Feeling bad or failure about yourself  2  Trouble concentrating 1  Moving slowly or fidgety/restless 3  Suicidal thoughts 0  PHQ-9 Score 20  Difficult doing work/chores Very difficult     Review of Systems  Constitutional: Negative.   HENT: Negative.   Eyes: Negative.   Respiratory: Negative.   Cardiovascular: Negative.     Gastrointestinal: Negative.   Endocrine: Negative.   Genitourinary: Negative.   Musculoskeletal: Negative.   Skin: Negative.   Allergic/Immunologic: Negative.   Neurological: Negative.   Hematological: Negative.   Psychiatric/Behavioral: Negative.   All other systems reviewed and are negative.      Objective:   Physical Exam  Constitutional: NAD.  Marland Kitchen. HENT: Normocephalic and atraumatic.  Eyes: EOMI. No discharge.  Cardiovascular: rrr. Respiratory:  CTA unlabored GI: Soft. Bowel sounds are normal.  Musculoskeletal: She exhibits no edema or tenderness.  Neurological: She is alert. Oriented to name,  February only.  Left facial weakness is still noted.  Motor: RUE/RLE: 4/5 proximal to distal.  RLE resting tremor which she can stop on command LUE/LLE: 4-/5 proximal to distal with noted left inattention Drags left foot when she walks and doesn't keep a tight grip of walker with left hand MAS: tr/4 Left elbow flexors Sensation intact to light touch  Right leg with tremor which she can stop on command Skin: Skin is warm and dry.  Marland Kitchen.  Psychiatric: Her affect remains quite flat.     Assessment & Plan:  61 year old female with history of T2DM, HTN, BUE tremors, CVA 2009 with memory deficits who presents for transitional care management after receiving CIR after right MCA infarct with history of CVA.  1. Cognitive deficits, dysarthria, hemiparesis secondary to right MCA infarct with history of CVA, with spastic hemiparesis.             -continue Home health PT, ?OT  -would like to refer to outpt PT, OT, SLP at the end of her home servicesto address gait, utilization of left arm and leg  -I'm ok with a left AFO. May need this until inattention improves.   -right lower extremity tremor is non-physiologic 2. Pain Management             -Controlled at present   3. HTN             improving control.  4. T2DM             improved control (100-150)  -they are checking TID at  present  5. Anxiety/depression             Meds per PCP, recently increased       Fifteen minutes of face to face patient care time were spent during this visit. All questions were encouraged and answered.  Follow up in 3 months.

## 2016-09-27 ENCOUNTER — Encounter: Payer: Medicare Other | Attending: Physical Medicine & Rehabilitation | Admitting: Physical Medicine & Rehabilitation

## 2016-10-25 ENCOUNTER — Encounter: Payer: Self-pay | Admitting: Nurse Practitioner

## 2016-10-25 ENCOUNTER — Ambulatory Visit (INDEPENDENT_AMBULATORY_CARE_PROVIDER_SITE_OTHER): Payer: Medicare Other | Admitting: Nurse Practitioner

## 2016-10-25 ENCOUNTER — Encounter (INDEPENDENT_AMBULATORY_CARE_PROVIDER_SITE_OTHER): Payer: Self-pay

## 2016-10-25 VITALS — BP 148/93 | HR 86 | Ht 66.0 in | Wt 159.0 lb

## 2016-10-25 DIAGNOSIS — I639 Cerebral infarction, unspecified: Secondary | ICD-10-CM | POA: Diagnosis not present

## 2016-10-25 DIAGNOSIS — I69322 Dysarthria following cerebral infarction: Secondary | ICD-10-CM

## 2016-10-25 DIAGNOSIS — E785 Hyperlipidemia, unspecified: Secondary | ICD-10-CM | POA: Diagnosis not present

## 2016-10-25 DIAGNOSIS — G8194 Hemiplegia, unspecified affecting left nondominant side: Secondary | ICD-10-CM

## 2016-10-25 DIAGNOSIS — I69391 Dysphagia following cerebral infarction: Secondary | ICD-10-CM

## 2016-10-25 DIAGNOSIS — I63511 Cerebral infarction due to unspecified occlusion or stenosis of right middle cerebral artery: Secondary | ICD-10-CM | POA: Diagnosis not present

## 2016-10-25 DIAGNOSIS — IMO0002 Reserved for concepts with insufficient information to code with codable children: Secondary | ICD-10-CM

## 2016-10-25 NOTE — Patient Instructions (Signed)
Continue stroke risk factors management   Continue  plavix for stroke prevention  Continue lipitor for stroke prevention - Follow up with your primary care physician for stroke risk factor modification.  Recommend maintain blood pressure goal <130/80,  todays reading 148 93 diabetes with hemoglobin A1c goal below 7.0%  lipids with LDL cholesterol goal below 70 mg/dL.  check BP and glucose at home and record continue aggressive exercise with PT/OTand speech Follow up 6 months

## 2016-10-25 NOTE — Progress Notes (Addendum)
[facility]  [name] DOB: [birth year]   REASON FOR VISIT: Follow-up for stroke 05/07/2016 HISTORY FROM: Patient    HISTORY OF PRESENT ILLNESS:Chloe Owens a 61 y.o.femalewith history of left thalamic CVA in 2009 with no residual deficits, HTN, DM2, smokeradmitted on 05/07/16 for trouble walking. MRI showed acute right PLIC infarct, and old left thalamic infarct. MRA showed b/l PCA stenosis. CUS, TTE unremarkable. LDL 107 and A1C 8.2. She was put on DAPT and lipitor 40 on discharge to CIR.   Interval History 06/27/16 Dr. Roda Shutters During the interval time, the patient has been doing better. She stayed in CIR for 2 weeks and then discharged home with outpt PT/OT. On 06/13/2016, she had worsening left facial droop and slurry speech, sent to [facility], head CT negative for acute abnormality, transfer to [facility] for admission. Was told to have a "new stroke", however, there was no MRI imaging or report on file. Patient was later on discharge home without any medication changes. Currently patient continued to have left hemiparesis, glucose still not under control. BP today 126/78. UPDATE 05/23/2018CM Chloe Owens, 61 year old female returns for follow-up with her husband. She has a history of stroke event in December 2017. She is currently on Plavix for secondary stroke prevention, her aspirin has been stopped. She is also on Lipitor for hyperlipidemia. She is currently receiving occupational therapy and speech  therapy at  outpatient clinic in [place]. She is to begin physical therapy next Week which was ordered by her primary care Dr. Reuel Boom. The patient has continued to have left hemiparesis. She ambulates with a walker and has had 2 falls in the home without injury according to the husband. He also reports that she's had no appetite last couple of days that she has been choking on her food particularly thin liquids. He also reports that she has  taken her meds with applesauce since her stroke. She returns for reevaluation.  REVIEW OF SYSTEMS: Full 14 system review of systems performed and notable only for those listed, all others are neg:  Constitutional: neg  Cardiovascular: neg Ear/Nose/Throat: Trouble swallowing choking episodes Skin: neg Eyes: neg Respiratory: neg Gastroitestinal: neg  Hematology/Lymphatic: neg  Endocrine: neg Musculoskeletal: Walking difficulty Allergy/Immunology: neg Neurological: Weakness Psychiatric: Confusion Sleep : neg   ALLERGIES: Allergies  Allergen Reactions  . Codeine Nausea And Vomiting and Rash  . Penicillins Rash    HOME MEDICATIONS: Outpatient Medications Prior to Visit  Medication Sig Dispense Refill  . ARIPiprazole (ABILIFY) 30 MG tablet Take 0.5 tablets (15 mg total) by mouth daily.    Marland Kitchen atorvastatin (LIPITOR) 40 MG tablet Take 1 tablet (40 mg total) by mouth daily at 6 PM. 30 tablet 1  . buPROPion (WELLBUTRIN SR) 150 MG 12 hr tablet Take 150 mg by mouth 2 (two) times daily.    . clonazePAM (KLONOPIN) 0.5 MG tablet Take 1 tablet (0.5 mg total) by mouth 2 (two) times daily. 10 tablet 0  . clopidogrel (PLAVIX) 75 MG tablet Take 1 tablet (75 mg total) by mouth daily. 30 tablet 0  . Insulin Glargine (BASAGLAR KWIKPEN) 100 UNIT/ML SOPN Use 25 units with breakfast and 20 units at bedtime. (Patient taking differently: Inject 45 Units into the skin. Use 45 units with breakfast.  Is taking Lantus.) 5 pen 0  . JANUVIA 100 MG tablet Take 100 mg by mouth every evening.    Marland Kitchen lisinopril (PRINIVIL,ZESTRIL) 20 MG tablet Take 1 tablet (20 mg total) by  mouth daily. 30 tablet 0  . ondansetron (ZOFRAN-ODT) 8 MG disintegrating tablet TAKE 1 TABLET BY MOUTH FOUR TIMES DAILY AS NEEDED FOR NAUSEA  0  . pantoprazole (PROTONIX) 40 MG tablet Take 1 tablet (40 mg total) by mouth 2 (two) times daily before a meal. 60 tablet 0  . polyethylene glycol (MIRALAX / GLYCOLAX) packet Take 17 g by mouth daily.  (Patient taking differently: Take 17 g by mouth daily as needed. ) 30 each 0  . senna-docusate (SENOKOT-S) 8.6-50 MG tablet Take 2 tablets by mouth at bedtime. 60 tablet 3  . traMADol (ULTRAM) 50 MG tablet Take 0.5-1 tablets (25-50 mg total) by mouth every 12 (twelve) hours as needed for moderate pain (or Headache unrelieved by tylenol). 14 tablet 0  . aspirin EC 325 MG tablet Take 1 tablet (325 mg total) by mouth daily. (Patient not taking: Reported on 10/25/2016) 42 tablet 0  . chlorhexidine (PERIDEX) 0.12 % solution Use as directed 15 mLs in the mouth or throat 4 (four) times daily. (Patient not taking: Reported on 10/25/2016) 120 mL 0   No facility-administered medications prior to visit.     PAST MEDICAL HISTORY: Past Medical History:  Diagnosis Date  . Anxiety   . Bladder infection    hx of  . Depression   . Diabetes mellitus without complication (HCC)   . GERD (gastroesophageal reflux disease)   . Hyperlipidemia   . Hypertension   . Neuromuscular disorder (HCC)   . Stroke [facility])    05/2008    PAST SURGICAL HISTORY: Past Surgical History:  Procedure Laterality Date  . ANKLE CLOSED REDUCTION  04/15/2012   Procedure: CLOSED REDUCTION ANKLE;  Surgeon: Senaida Lange, MD;  Location: [facility] OR;  Service: Orthopedics;  Laterality: Left;  . CARPAL TUNNEL RELEASE     bilaterally  . ORIF ANKLE FRACTURE  05/09/2012   Procedure: OPEN REDUCTION INTERNAL FIXATION (ORIF) ANKLE FRACTURE;  Surgeon: Toni Arthurs, MD;  Location: [facility] OR;  Service: Orthopedics;  Laterality: Left;  ORIF VERSES EXFIX LEFT ANKLE FRACTURE    FAMILY HISTORY: Family History  Problem Relation Age of Onset  . CAD Father   . CAD Mother   . Cancer - Other Mother        Breast  . Cancer - Other Brother        Unknown type    SOCIAL HISTORY: Social History   Social History  . Marital status: Married    Spouse name: N/A  . Number of children: N/A  . Years of education: N/A   Occupational History  . Not on file.     Social History Main Topics  . Smoking status: Former Smoker    Packs/day: 1.00    Types: Cigarettes    Quit date: 04/15/2012  . Smokeless tobacco: Never Used  . Alcohol use No  . Drug use: No  . Sexual activity: No   Other Topics Concern  . Not on file   Social History Narrative  . No narrative on file     PHYSICAL EXAM  Vitals:   10/25/16 1450  BP: (!) 148/93  Pulse: 86  Weight: 159 lb (72.1 kg)  Height: 5\' 6"  (1.676 m)   Body mass index is 25.66 kg/m.  Generalized: Well developed, in no acute distress  Head: normocephalic and atraumatic,. Oropharynx benign  Neck: Supple, no carotid bruits  Cardiac: Regular rate rhythm, no murmur  Lungs clear to auscultation Musculoskeletal: No deformity   Neurological examination  Mentation: Alert oriented to time, place, history From husband . Attention span and concentration appropriate. Mild psychomotor slowing. Recent and remote memory intact.  Follows all commands speech mild dysarthria.   Cranial nerve II-XII: Fundoscopic exam reveals sharp disc margins.Pupils were equal round reactive to light extraocular movements were full, visual field were full on confrontational test. Left facial droop . hearing was intact to finger rubbing bilaterally. Uvula tongue midline. head turning and shoulder shrug were normal and symmetric.Tongue protrusion into cheek strength was normal. Motor: normal bulk and tone, full strength in the BUE, BLE, except left upper extremity 4 out of 5 left lower extremity 4 out of 5, no pronator drift. No focal weakness Sensory: normal and symmetric to light touch, in the upper and lower extremities  Coordination: finger-nose-finger, no dysmetria or ataxia has problems with heel-to-shin Reflexes: 1+ upper lower and symmetric, plantar responses were flexor bilaterally. Gait and Station: Rising up from seated position with push off, ambulated 30 feet slow steady gait with walker   DIAGNOSTIC DATA (LABS,  IMAGING, TESTING) - I reviewed patient records, labs, notes, testing and imaging myself where available.  Lab Results  Component Value Date   WBC 7.9 05/24/2016   HGB 12.6 05/24/2016   HCT 37.2 05/24/2016   MCV 86.9 05/24/2016   PLT 253 05/24/2016      Component Value Date/Time   NA 136 05/24/2016 1213   K 3.6 05/24/2016 1213   CL 102 05/24/2016 1213   CO2 25 05/24/2016 1213   GLUCOSE 414 (H) 05/26/2016 1237   BUN 14 05/24/2016 1213   CREATININE 1.19 (H) 05/24/2016 1213   CALCIUM 9.9 05/24/2016 1213   PROT 6.4 (L) 05/11/2016 0521   ALBUMIN 3.2 (L) 05/11/2016 0521   AST 13 (L) 05/11/2016 0521   ALT 14 05/11/2016 0521   ALKPHOS 66 05/11/2016 0521   BILITOT 1.1 05/11/2016 0521   GFRNONAA 48 (L) 05/24/2016 1213   GFRAA 56 (L) 05/24/2016 1213   Lab Results  Component Value Date   CHOL 181 05/07/2016   HDL 59 05/07/2016   LDLCALC 107 (H) 05/07/2016   TRIG 74 05/07/2016   CHOLHDL 3.1 05/07/2016   Lab Results  Component Value Date   HGBA1C 8.2 (H) 05/07/2016       ASSESSMENT AND PLAN 61y.o. Caucasian female with PMH of left thalamic CVA in 2009 with no residual deficits, HTN, DM2, smokeradmitted on 05/07/16 for trouble walking. MRI showed acute right PLIC infarct, and old left thalamic infarct. MRA showed b/l PCA stenosis. CUS, TTE unremarkable. LDL 107 and A1C 8.2. She was put on DAPT and lipitor 40 on discharge. Stroke likely small vessel event. On 06/13/2016, she had worsening left facial droop and slurry speech, head CT negative for acute abnormality, [facility], however, there was no MRI imaging or report on file. Patient was later on discharge home without medication changes. Currently patient continued to have left hemiparesis, glucose still not under control. Has quit smoking. Has mild dysarthria today and difficulty swallowing and  choking spells for several days according to husband. Lungs are clear.The patient is a current patient of Dr. Roda ShuttersXu who is out of the  office today . This note is sent to the work in doctor.     PLAN: Continue stroke risk factors management   Continue  plavix for stroke prevention  Continue lipitor for stroke prevention - Follow up with your primary care physician for stroke risk factor modification.  Recommend maintain blood pressure goal <130/80,  todays reading 148 93 diabetes with hemoglobin A1c goal below 7.0%  lipids with LDL cholesterol goal below 70 mg/dL.  check BP and glucose at home and record continue aggressive exercise with PT/OTand speech Call placed to  primary care Dr. Reuel Boom regarding swallowing study, left message he will return call Follow up 6 months Nilda Riggs, [facility], APRN  [facility] [address], [address] [place], [place] [address] [phone]  I reviewed the above note and documentation by the Nurse Practitioner and agree with the history, physical exam, assessment and plan as outlined above. I was immediately available for face-to-face consultation. Huston Foley, MD, PhD [facility])

## 2016-10-26 ENCOUNTER — Other Ambulatory Visit: Payer: Self-pay

## 2016-10-26 ENCOUNTER — Telehealth: Payer: Self-pay | Admitting: Nurse Practitioner

## 2016-10-26 DIAGNOSIS — I639 Cerebral infarction, unspecified: Secondary | ICD-10-CM

## 2016-10-26 NOTE — Telephone Encounter (Signed)
Patients husband called office with [facility] of [facility] their phone number [phone].  Per husband patient patient can be scheduled for tomorrow for swallowing test.

## 2016-10-26 NOTE — Telephone Encounter (Signed)
Telephone call to husband. Made him aware I did not hear back from Dr. Reuel Boomaniel after I placed a call to his office yesterday. Patient has an appointment with speech therapy today at 1:00. He was asked to ask the speech therapist if she can do a swallowing evaluation. She currently is getting speech therapy at  [facility] however he does not know the name of the clinic. He claims he will have speech therapist call me.

## 2016-10-26 NOTE — Telephone Encounter (Signed)
Called and spoke to husband with Apt Date for Swallow study for he relayed he was in ER with his wife now checking in. Patient's husband did not relay to me what was wrong because he was trying to get her checked in.   I called back Scheduling Dept to see if patient could get her swallow study done while she was there in the hospital.  Dr. Franchot Erichsenallara is fine with this and swallow study will be done today while patient is there in Hospital.  Patient's husband is aware.

## 2016-10-26 NOTE — Telephone Encounter (Signed)
The orders for a swallowing test put in per Carolyn(NP). [facility] will be [facility]. [facility]

## 2016-10-26 NOTE — Telephone Encounter (Signed)
Please order

## 2016-11-02 DIAGNOSIS — B961 Klebsiella pneumoniae [K. pneumoniae] as the cause of diseases classified elsewhere: Secondary | ICD-10-CM | POA: Diagnosis not present

## 2016-11-02 DIAGNOSIS — R1311 Dysphagia, oral phase: Secondary | ICD-10-CM | POA: Diagnosis not present

## 2016-11-02 DIAGNOSIS — F419 Anxiety disorder, unspecified: Secondary | ICD-10-CM | POA: Diagnosis not present

## 2016-11-02 DIAGNOSIS — N390 Urinary tract infection, site not specified: Secondary | ICD-10-CM | POA: Diagnosis not present

## 2016-11-02 DIAGNOSIS — R41841 Cognitive communication deficit: Secondary | ICD-10-CM | POA: Diagnosis not present

## 2016-11-02 DIAGNOSIS — E119 Type 2 diabetes mellitus without complications: Secondary | ICD-10-CM | POA: Diagnosis not present

## 2016-11-02 DIAGNOSIS — M6281 Muscle weakness (generalized): Secondary | ICD-10-CM | POA: Diagnosis not present

## 2016-11-02 DIAGNOSIS — F3181 Bipolar II disorder: Secondary | ICD-10-CM | POA: Diagnosis not present

## 2016-11-02 DIAGNOSIS — I10 Essential (primary) hypertension: Secondary | ICD-10-CM | POA: Diagnosis not present

## 2016-11-02 DIAGNOSIS — R1310 Dysphagia, unspecified: Secondary | ICD-10-CM | POA: Diagnosis not present

## 2016-11-02 DIAGNOSIS — E876 Hypokalemia: Secondary | ICD-10-CM | POA: Diagnosis not present

## 2016-11-02 DIAGNOSIS — N3281 Overactive bladder: Secondary | ICD-10-CM | POA: Diagnosis not present

## 2016-11-02 DIAGNOSIS — K219 Gastro-esophageal reflux disease without esophagitis: Secondary | ICD-10-CM | POA: Diagnosis not present

## 2016-11-02 DIAGNOSIS — I638 Other cerebral infarction: Secondary | ICD-10-CM | POA: Diagnosis not present

## 2016-11-02 DIAGNOSIS — R2681 Unsteadiness on feet: Secondary | ICD-10-CM | POA: Diagnosis not present

## 2016-11-02 DIAGNOSIS — E785 Hyperlipidemia, unspecified: Secondary | ICD-10-CM | POA: Diagnosis not present

## 2016-11-02 DIAGNOSIS — R262 Difficulty in walking, not elsewhere classified: Secondary | ICD-10-CM | POA: Diagnosis not present

## 2017-04-30 ENCOUNTER — Ambulatory Visit: Payer: Medicare Other | Admitting: Nurse Practitioner

## 2017-10-19 IMAGING — MR MR MRA HEAD W/O CM
10 of 12 series · 31 of 48 positions shown · non-contrast
Comparison: CT head 05/07/2016.  MR brain 08/05/2013.

CLINICAL DATA: LEFT-sided weakness began 05/06/2016. Stroke risk
factors include diabetes, hypertension, and previous cerebral
infarction.

EXAM:
MRI HEAD WITHOUT CONTRAST
MRA HEAD WITHOUT CONTRAST
TECHNIQUE: Multiplanar, multiecho pulse sequences of the brain and surrounding
structures were obtained without intravenous contrast. Angiographic
images of the head were obtained using MRA technique without
contrast.

[Series 4: DWI · axial · 3.0mm · 1.09mm/px · z∈[-45,+99]mm · 7 of 98 slices shown (1 of 4)]
[im 1/98]
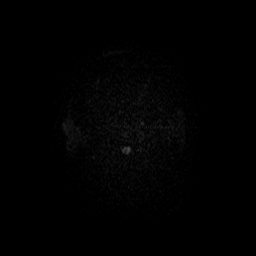
[im 17/98]
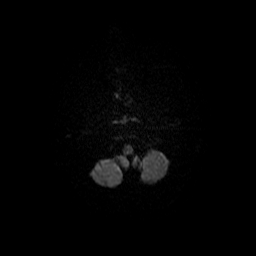
[im 33/98]
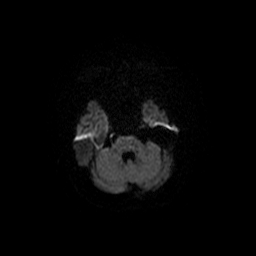
[im 49/98]
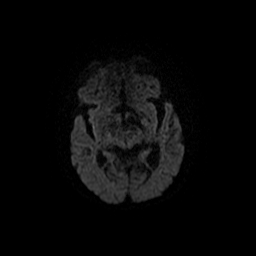
[im 65/98]
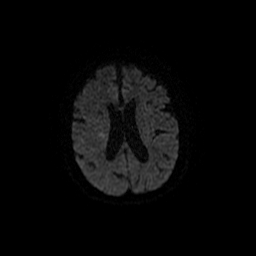
[im 81/98]
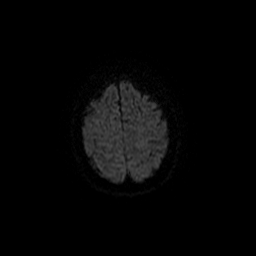
[im 98/98]
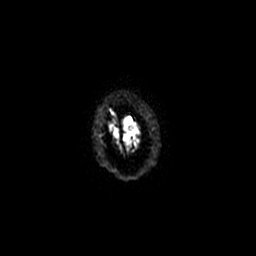

[Series 5: DWI · coronal · 5.0mm · 1.09mm/px · 5 of 72 slices shown (2 of 4)]
[im 1/72]
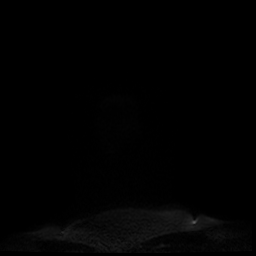
[im 18/72]
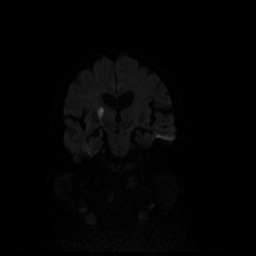
[im 36/72]
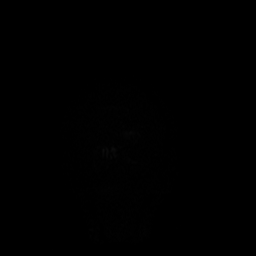
[im 54/72]
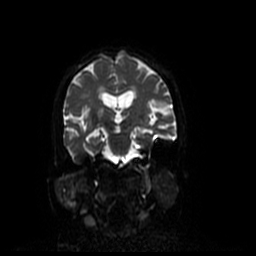
[im 72/72]
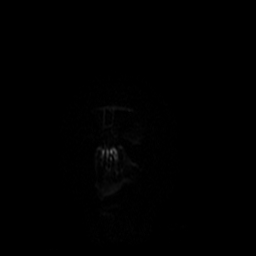

[Series 7: T1 · sagittal · 5.0mm · 0.47mm/px · 1 of 23 slices shown]
[im 1/23]
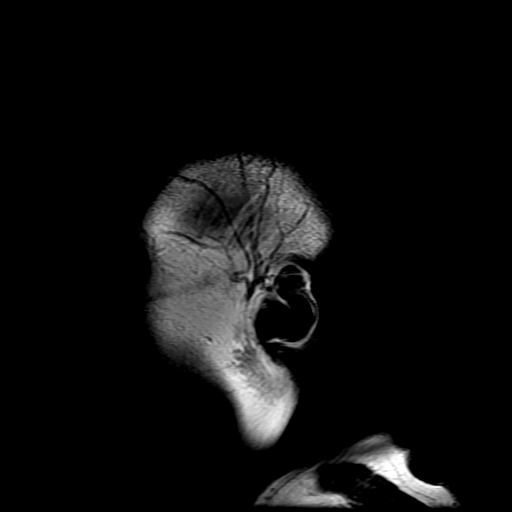

[Series 8: T2 · axial · 5.0mm · 0.43mm/px · z∈[-45,+99]mm · 2 of 25 slices shown]
[im 1/25]
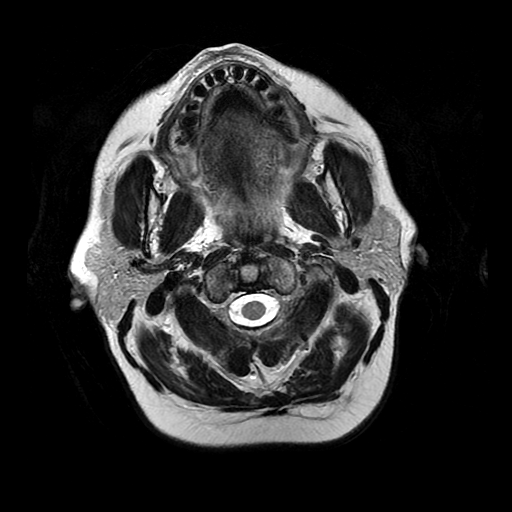
[im 25/25]
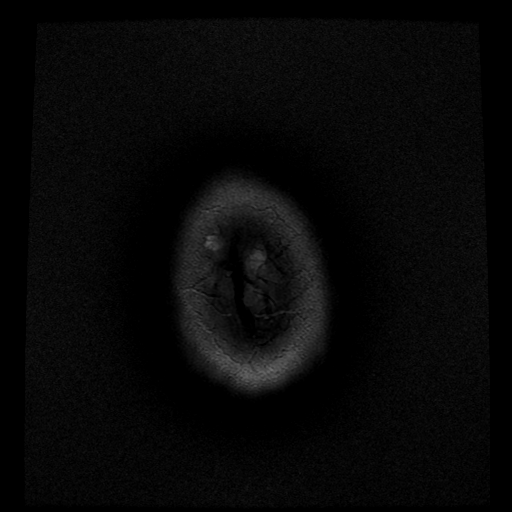

[Series 9: FLAIR · axial · 5.0mm · 0.43mm/px · z∈[-45,+99]mm · 2 of 25 slices shown]
[im 1/25]
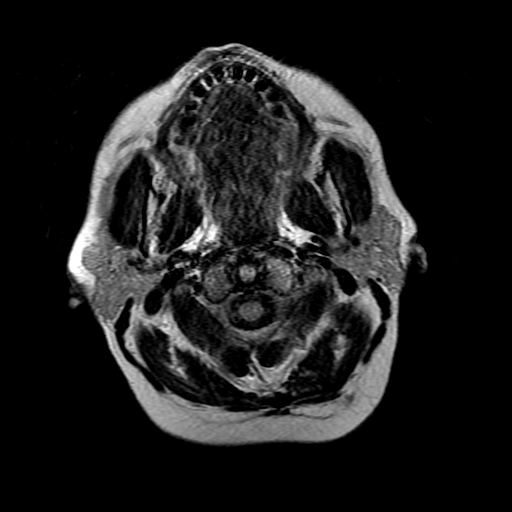
[im 25/25]
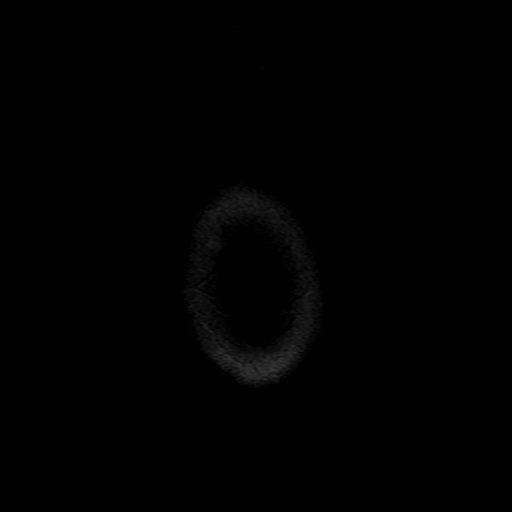

[Series 10: ax mpgr · axial · 5.0mm · 0.43mm/px · z∈[-45,+99]mm · 2 of 25 slices shown]
[im 1/25]
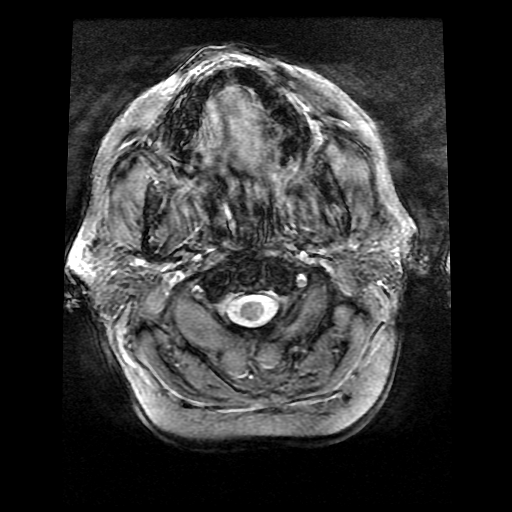
[im 25/25]
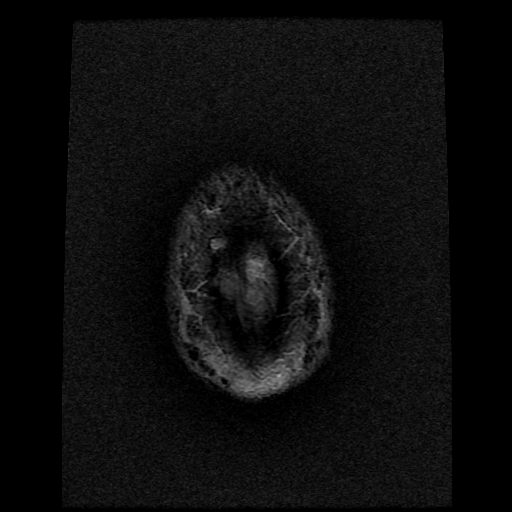

[Series 12: T2 post-contrast · coronal · 5.0mm · 0.39mm/px · 2 of 28 slices shown]
[im 1/28]
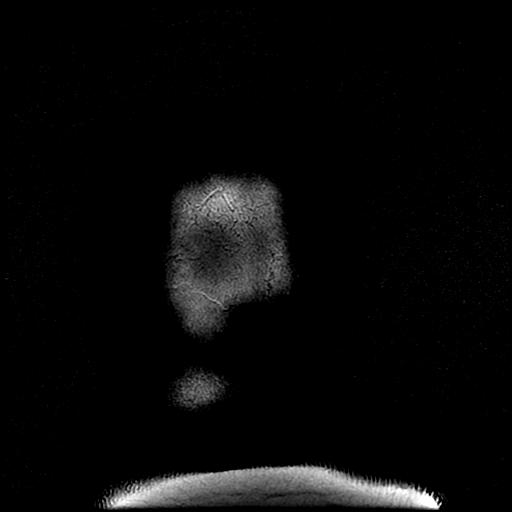
[im 28/28]
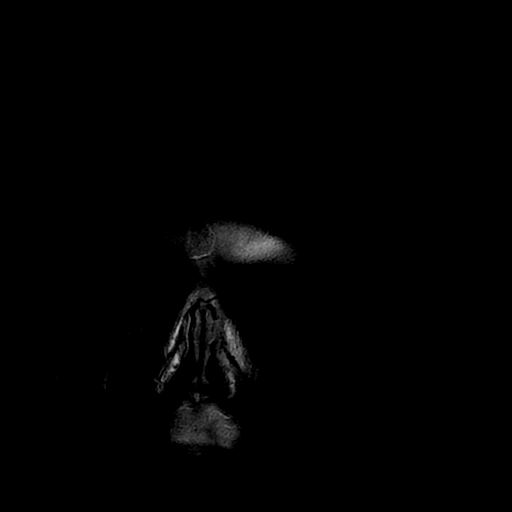

[Series 13: (id) mt fs · axial · 1.4mm · 0.43mm/px · z∈[-30,-2]mm · 3 of 136 slices shown]
[im 1/136]
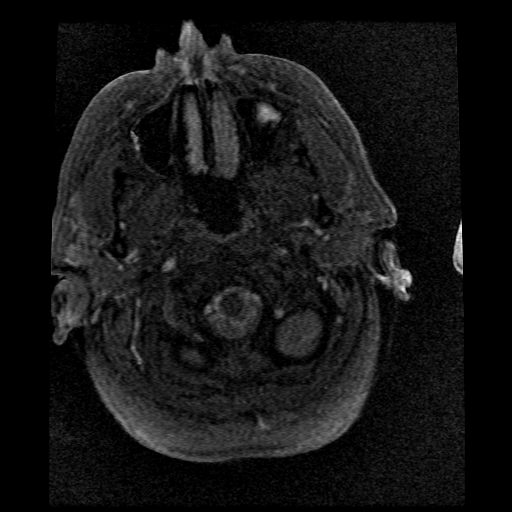
[im 28/136]
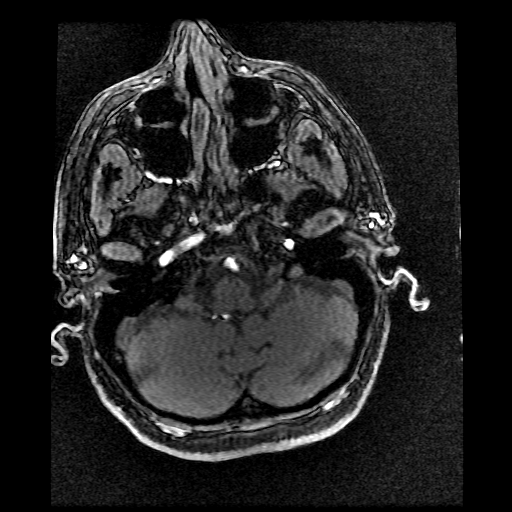
[im 41/136]
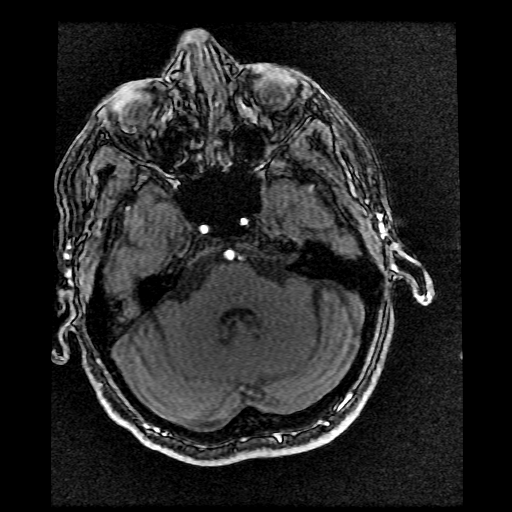

[Series 400: DWI · axial · 3.0mm · 1.09mm/px · z∈[-45,+99]mm · 4 of 49 slices shown (3 of 4)]
[im 1/49]
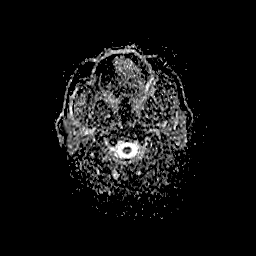
[im 17/49]
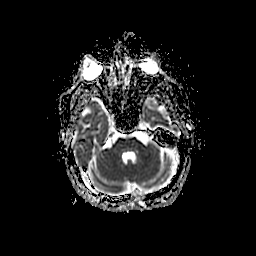
[im 33/49]
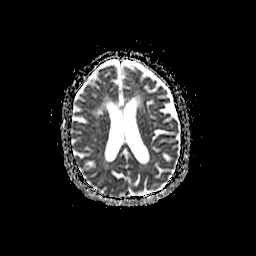
[im 49/49]
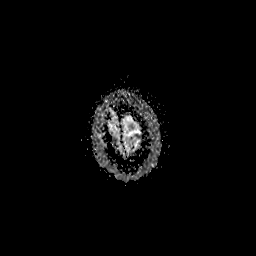

[Series 500: DWI · coronal · 5.0mm · 1.09mm/px · 3 of 36 slices shown (4 of 4)]
[im 1/36]
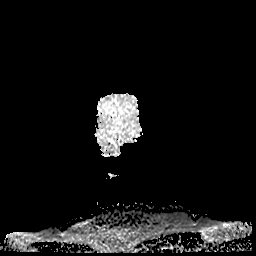
[im 18/36]
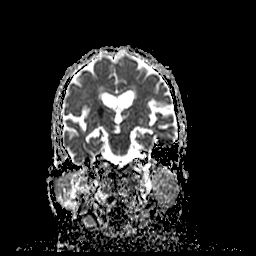
[im 36/36]
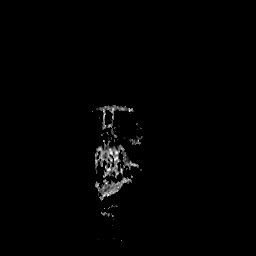

[31 of 48 positions shown; findings below may reference images not displayed]

FINDINGS: MRI HEAD FINDINGS

Brain: Ovoid area of marked restricted diffusion, 5 x 12 mm
cross-section, affecting the posterior lentiform nucleus and
posterior limb internal capsule consistent with acute infarction. T2
shine through surrounds an area of previous centrum semiovale
lacunar infarction with surrounding gliosis.

No hemorrhage, mass lesion, hydrocephalus, or extra-axial fluid.

Premature for age cerebral and cerebellar atrophy. Extensive T2 and
FLAIR hyperintensity throughout the white matter consistent with
small vessel disease.

Vascular: Flow voids are maintained throughout the carotid, basilar,
and vertebral arteries. There are no areas of chronic hemorrhage.

Skull and upper cervical spine: Unremarkable visualized calvarium,
skullbase, and cervical vertebrae. Pituitary, pineal, cerebellar
tonsils unremarkable. Mild cervical stenosis due to spondylosis at
C5-6.

Sinuses/Orbits: Conjugate gaze to the RIGHT. Mild chronic sinus
disease.

Other: None.

MRA HEAD FINDINGS

The internal carotid arteries are widely patent. The basilar artery
is widely patent with vertebrals codominant. There is no
intracranial stenosis or aneurysm.

The anterior cerebral arteries and middle cerebral arteries are
widely patent.

Both posterior cerebral arteries are diseased. There is apparent
occlusion of the LEFT PCA with no distal flow related enhancement at
its P1/P2 junction. On the RIGHT, the P2 PCA is severely diseased
with segmental areas of narrowing or near total occlusion.

No definite cerebellar branch flow-limiting stenosis or occlusion is
seen.
IMPRESSION: Acute RIGHT MCA lenticulostriate territory infarct affecting the
lentiform nucleus and posterior limb internal capsule correlates
with the CT abnormality. This is nonhemorrhagic.

Atrophy and small vessel disease of an advanced nature.

No anterior circulation stenosis or occlusion. Severe BILATERAL PCA
disease is observed, as described above.

## 2017-10-24 IMAGING — DX DG SACRUM/COCCYX 2+V
3 series · 3 of 3 positions shown · non-contrast
Comparison: By CT 05/28/2014

CLINICAL DATA: History of fall at home with pain. Initial
encounter.

EXAM:
SACRUM AND COCCYX - 2+ VIEW

[coccyx ap]
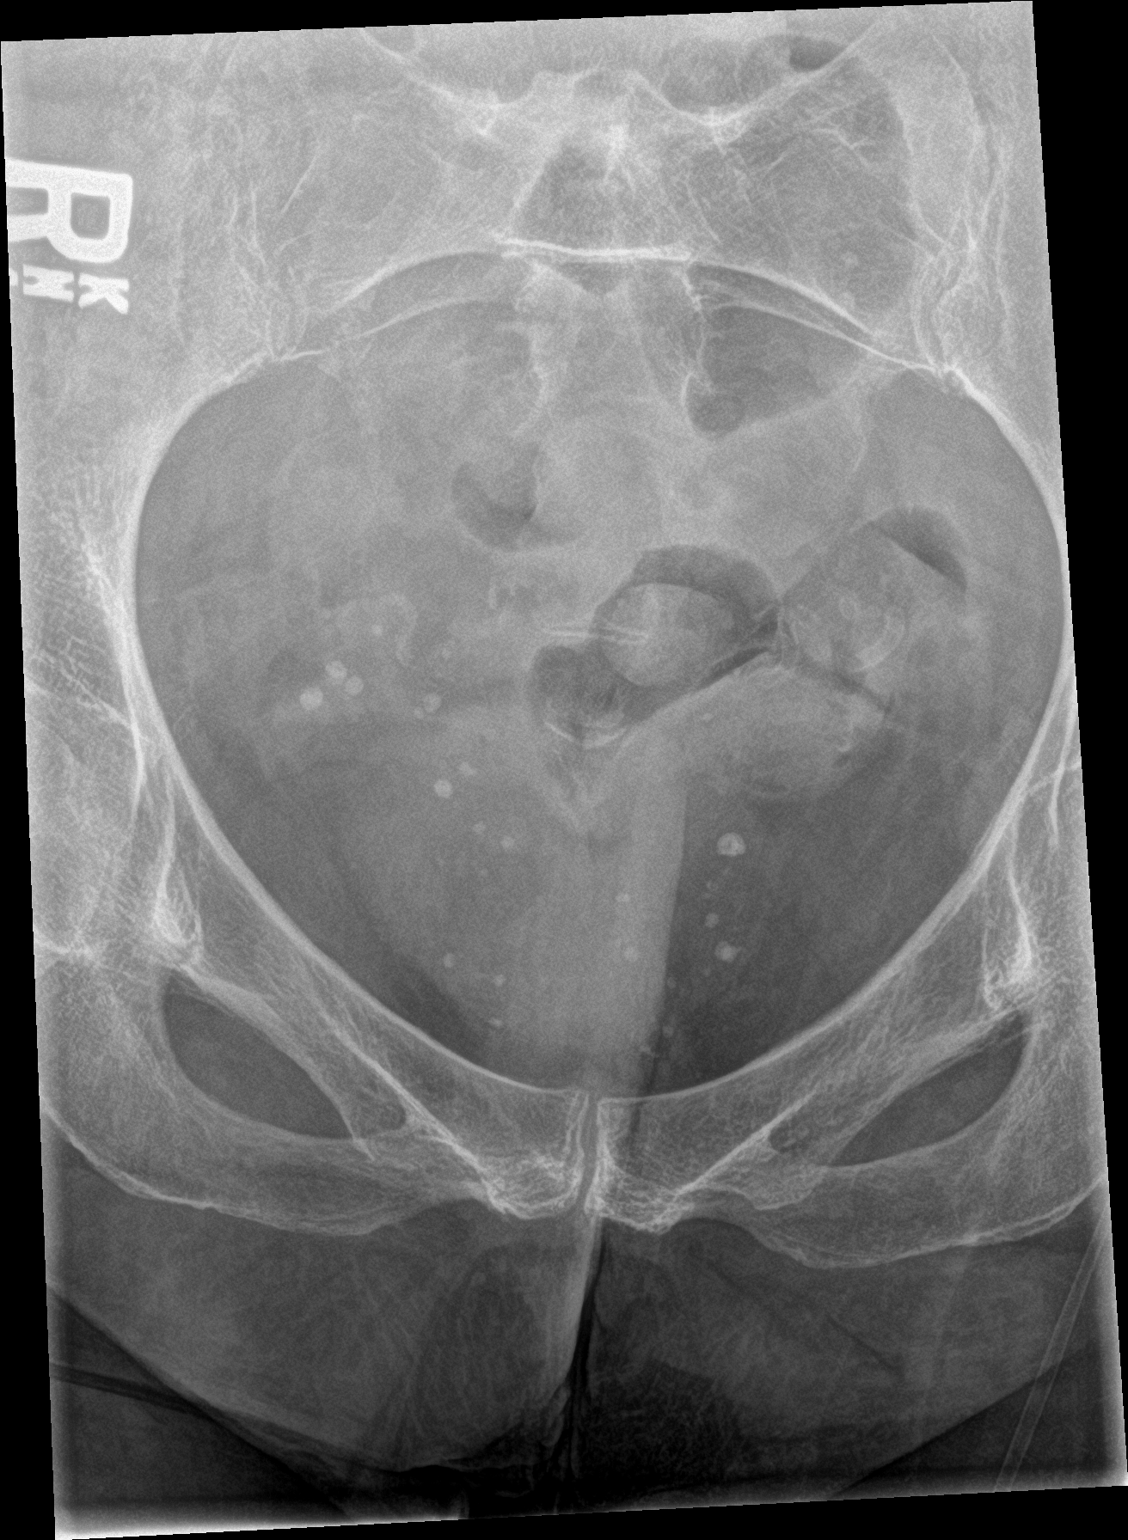

[sacrum ap]
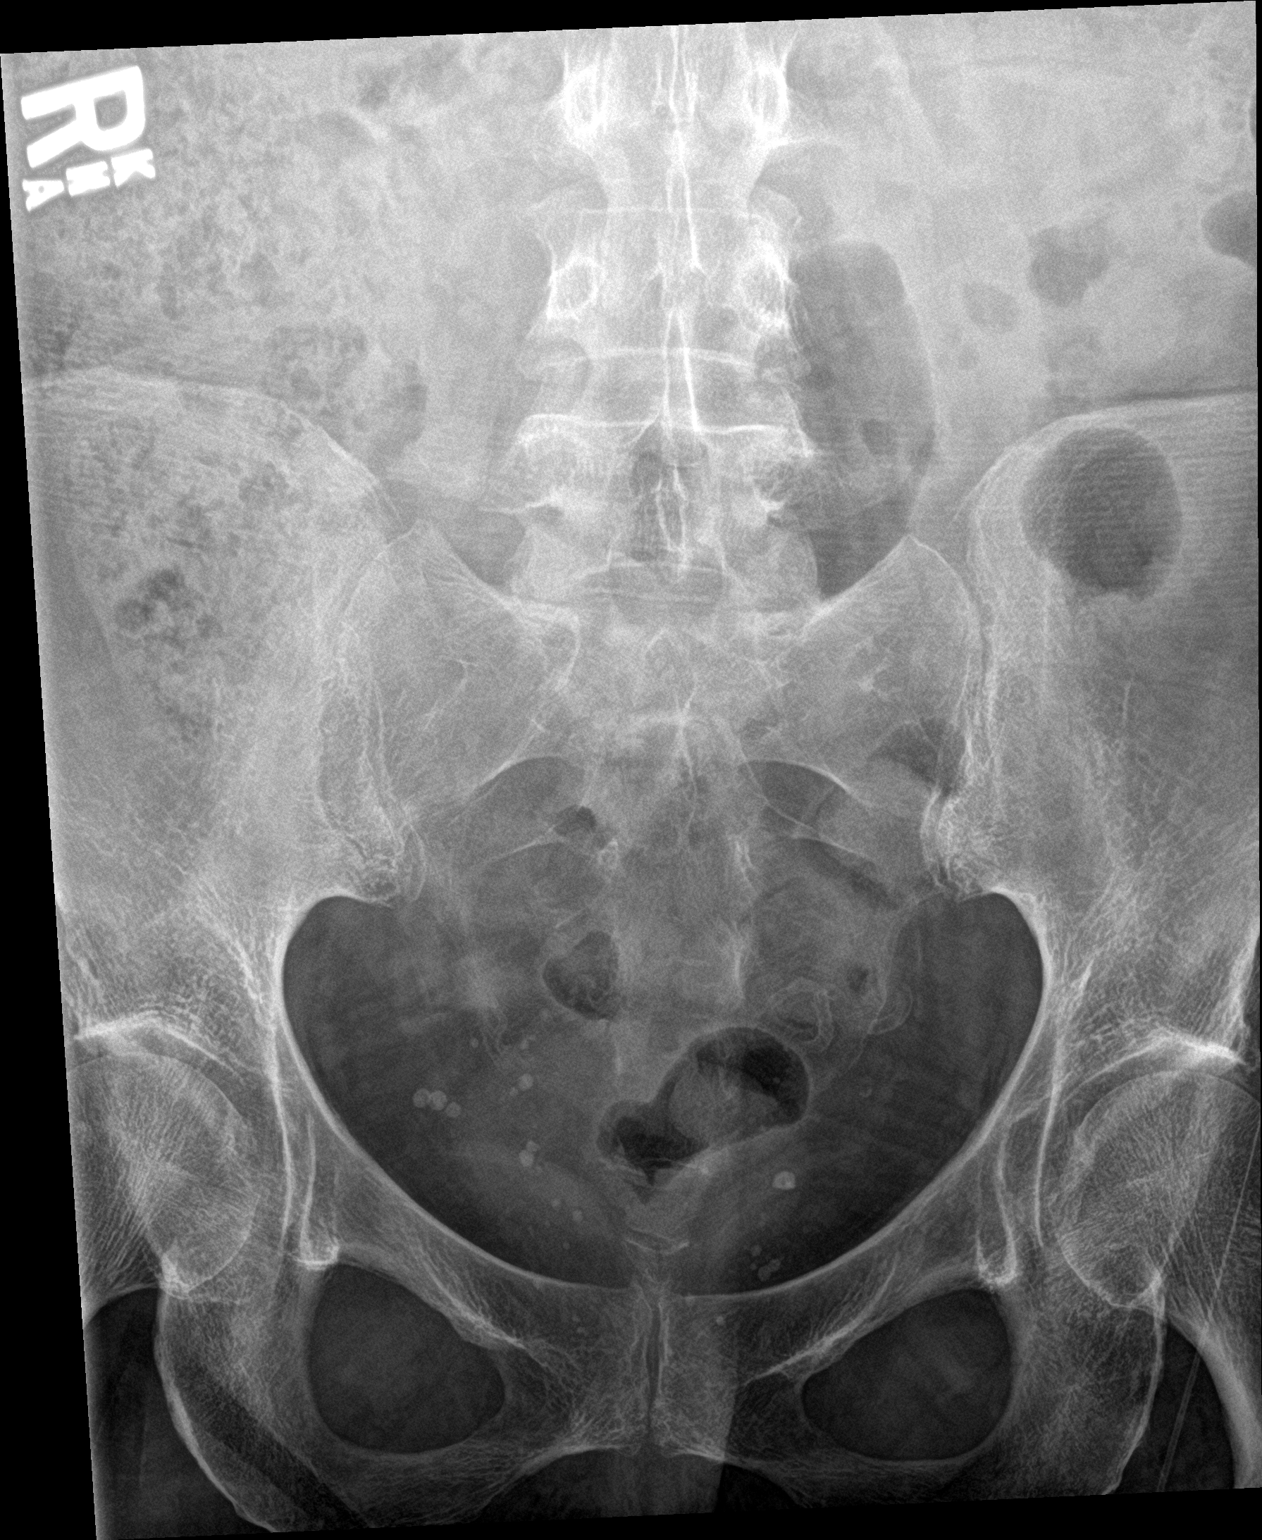

[sacrum lat]
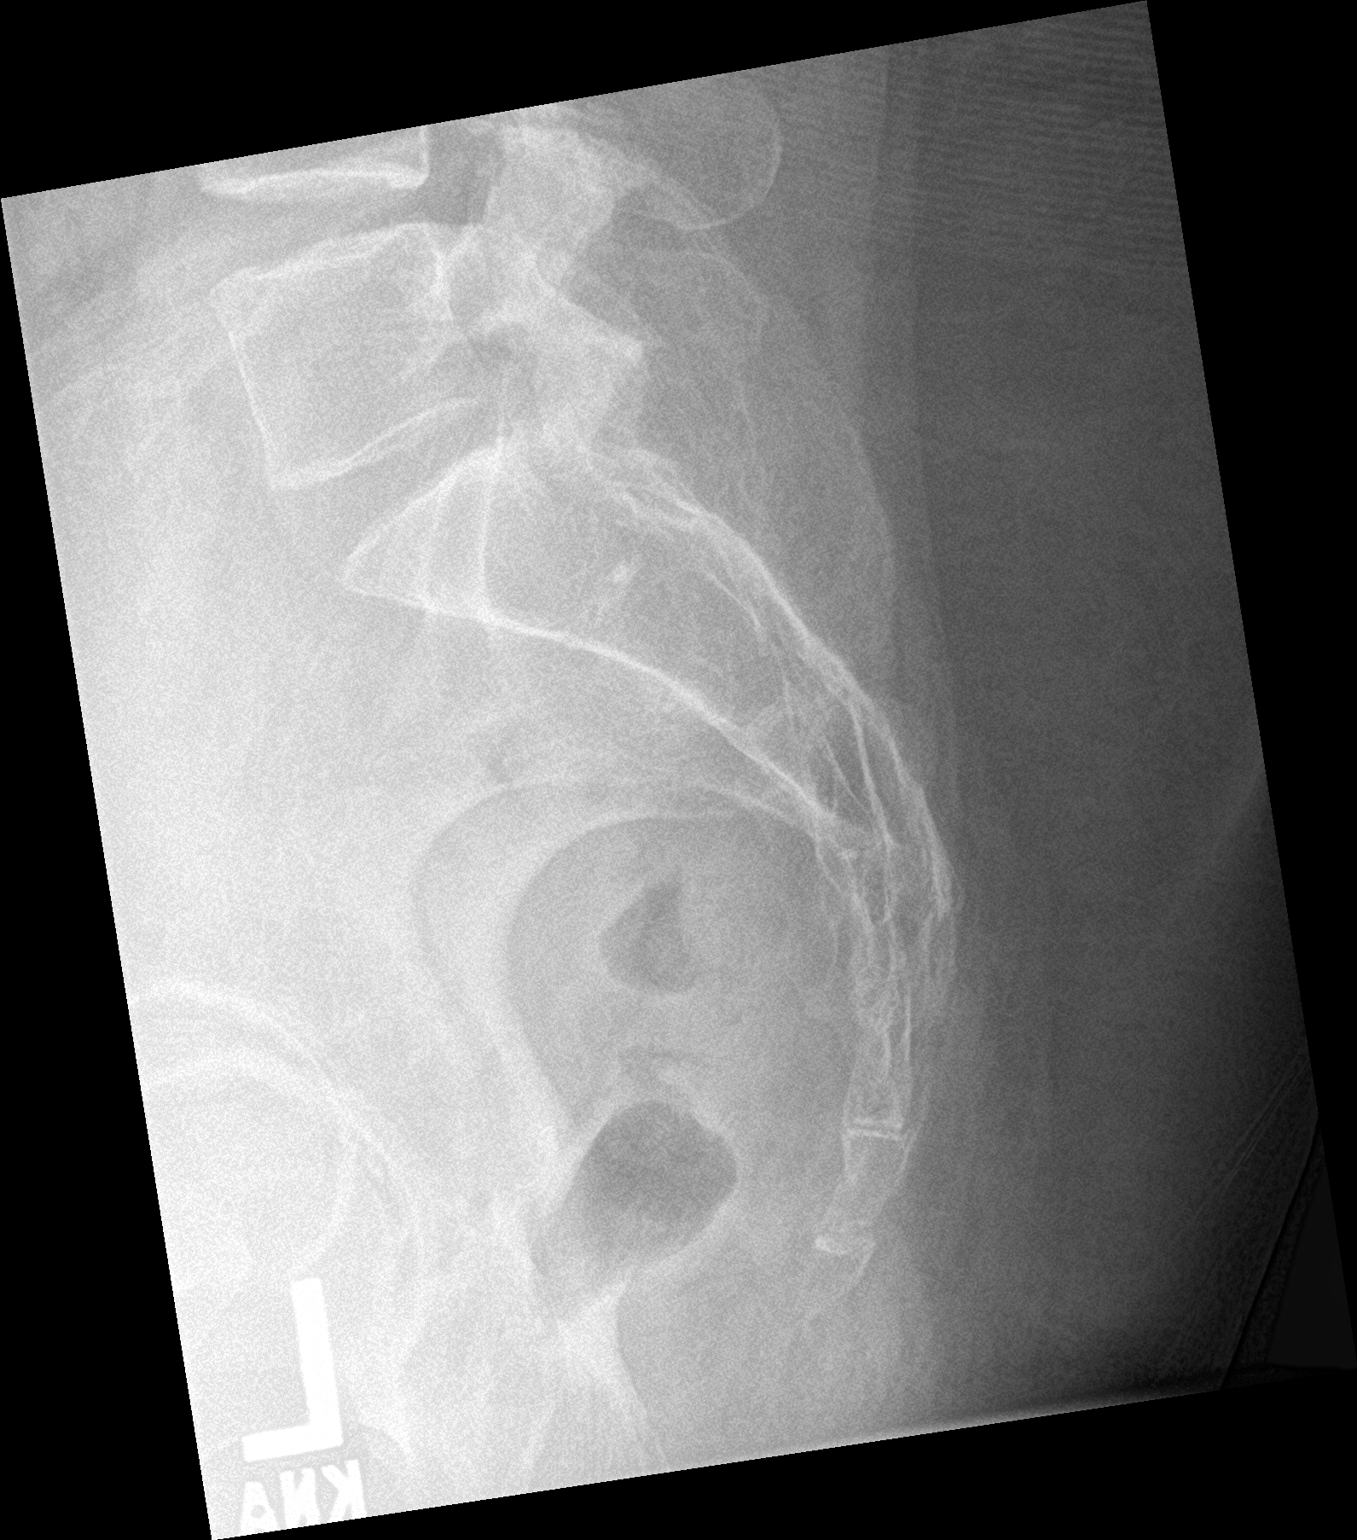

[3 of 3 positions shown; findings below may reference images not displayed]

FINDINGS: There is no evidence of fracture or other focal bone lesions.
IMPRESSION: Negative.

## 2017-11-10 ENCOUNTER — Encounter (HOSPITAL_COMMUNITY): Payer: Self-pay | Admitting: Emergency Medicine

## 2017-11-10 ENCOUNTER — Inpatient Hospital Stay (HOSPITAL_COMMUNITY)
Admission: EM | Admit: 2017-11-10 | Discharge: 2017-11-14 | DRG: 369 | Disposition: A | Discharge status: Home Health | Payer: Medicare Other | Attending: Internal Medicine | Admitting: Internal Medicine

## 2017-11-10 DIAGNOSIS — N390 Urinary tract infection, site not specified: Secondary | ICD-10-CM | POA: Diagnosis not present

## 2017-11-10 DIAGNOSIS — N179 Acute kidney failure, unspecified: Secondary | ICD-10-CM | POA: Diagnosis present

## 2017-11-10 DIAGNOSIS — I69391 Dysphagia following cerebral infarction: Secondary | ICD-10-CM | POA: Diagnosis not present

## 2017-11-10 DIAGNOSIS — Z79899 Other long term (current) drug therapy: Secondary | ICD-10-CM | POA: Diagnosis not present

## 2017-11-10 DIAGNOSIS — I129 Hypertensive chronic kidney disease with stage 1 through stage 4 chronic kidney disease, or unspecified chronic kidney disease: Secondary | ICD-10-CM | POA: Diagnosis not present

## 2017-11-10 DIAGNOSIS — R1310 Dysphagia, unspecified: Secondary | ICD-10-CM | POA: Diagnosis present

## 2017-11-10 DIAGNOSIS — Z7902 Long term (current) use of antithrombotics/antiplatelets: Secondary | ICD-10-CM

## 2017-11-10 DIAGNOSIS — I10 Essential (primary) hypertension: Secondary | ICD-10-CM | POA: Diagnosis present

## 2017-11-10 DIAGNOSIS — R402412 Glasgow coma scale score 13-15, at arrival to emergency department: Secondary | ICD-10-CM | POA: Diagnosis not present

## 2017-11-10 DIAGNOSIS — R6510 Systemic inflammatory response syndrome (SIRS) of non-infectious origin without acute organ dysfunction: Secondary | ICD-10-CM | POA: Diagnosis present

## 2017-11-10 DIAGNOSIS — E1165 Type 2 diabetes mellitus with hyperglycemia: Secondary | ICD-10-CM | POA: Diagnosis not present

## 2017-11-10 DIAGNOSIS — K5900 Constipation, unspecified: Secondary | ICD-10-CM | POA: Diagnosis not present

## 2017-11-10 DIAGNOSIS — Z794 Long term (current) use of insulin: Secondary | ICD-10-CM

## 2017-11-10 DIAGNOSIS — F0150 Vascular dementia without behavioral disturbance: Secondary | ICD-10-CM | POA: Diagnosis present

## 2017-11-10 DIAGNOSIS — Z87891 Personal history of nicotine dependence: Secondary | ICD-10-CM | POA: Diagnosis not present

## 2017-11-10 DIAGNOSIS — K226 Gastro-esophageal laceration-hemorrhage syndrome: Secondary | ICD-10-CM | POA: Diagnosis not present

## 2017-11-10 DIAGNOSIS — N183 Chronic kidney disease, stage 3 unspecified: Secondary | ICD-10-CM | POA: Diagnosis present

## 2017-11-10 DIAGNOSIS — D649 Anemia, unspecified: Secondary | ICD-10-CM | POA: Diagnosis not present

## 2017-11-10 DIAGNOSIS — K317 Polyp of stomach and duodenum: Secondary | ICD-10-CM | POA: Diagnosis present

## 2017-11-10 DIAGNOSIS — K449 Diaphragmatic hernia without obstruction or gangrene: Secondary | ICD-10-CM | POA: Diagnosis present

## 2017-11-10 DIAGNOSIS — Z7982 Long term (current) use of aspirin: Secondary | ICD-10-CM | POA: Diagnosis not present

## 2017-11-10 DIAGNOSIS — E119 Type 2 diabetes mellitus without complications: Secondary | ICD-10-CM

## 2017-11-10 DIAGNOSIS — Z7401 Bed confinement status: Secondary | ICD-10-CM

## 2017-11-10 DIAGNOSIS — K219 Gastro-esophageal reflux disease without esophagitis: Secondary | ICD-10-CM | POA: Diagnosis present

## 2017-11-10 DIAGNOSIS — E785 Hyperlipidemia, unspecified: Secondary | ICD-10-CM | POA: Diagnosis present

## 2017-11-10 DIAGNOSIS — I6930 Unspecified sequelae of cerebral infarction: Secondary | ICD-10-CM

## 2017-11-10 DIAGNOSIS — R110 Nausea: Secondary | ICD-10-CM

## 2017-11-10 DIAGNOSIS — F418 Other specified anxiety disorders: Secondary | ICD-10-CM | POA: Diagnosis not present

## 2017-11-10 DIAGNOSIS — K3189 Other diseases of stomach and duodenum: Secondary | ICD-10-CM | POA: Diagnosis present

## 2017-11-10 DIAGNOSIS — I69354 Hemiplegia and hemiparesis following cerebral infarction affecting left non-dominant side: Secondary | ICD-10-CM | POA: Diagnosis not present

## 2017-11-10 DIAGNOSIS — F411 Generalized anxiety disorder: Secondary | ICD-10-CM | POA: Diagnosis present

## 2017-11-10 DIAGNOSIS — R112 Nausea with vomiting, unspecified: Secondary | ICD-10-CM | POA: Diagnosis present

## 2017-11-10 DIAGNOSIS — K920 Hematemesis: Secondary | ICD-10-CM | POA: Diagnosis not present

## 2017-11-10 DIAGNOSIS — E1122 Type 2 diabetes mellitus with diabetic chronic kidney disease: Secondary | ICD-10-CM | POA: Diagnosis present

## 2017-11-10 LAB — CBC WITH DIFFERENTIAL/PLATELET
Abs Immature Granulocytes: 0.1 10*3/uL (ref 0.0–0.1)
Basophils Absolute: 0.1 10*3/uL (ref 0.0–0.1)
Basophils Relative: 0 %
Eosinophils Absolute: 0 10*3/uL (ref 0.0–0.7)
Eosinophils Relative: 0 %
HCT: 45.8 % (ref 36.0–46.0)
Hemoglobin: 14.7 g/dL (ref 12.0–15.0)
Immature Granulocytes: 1 %
Lymphocytes Relative: 8 %
Lymphs Abs: 1.6 10*3/uL (ref 0.7–4.0)
MCH: 27.5 pg (ref 26.0–34.0)
MCHC: 32.1 g/dL (ref 30.0–36.0)
MCV: 85.8 fL (ref 78.0–100.0)
Monocytes Absolute: 1.4 10*3/uL — ABNORMAL HIGH (ref 0.1–1.0)
Monocytes Relative: 7 %
Neutro Abs: 18.5 10*3/uL — ABNORMAL HIGH (ref 1.7–7.7)
Neutrophils Relative %: 84 %
Platelets: 163 10*3/uL (ref 150–400)
RBC: 5.34 MIL/uL — ABNORMAL HIGH (ref 3.87–5.11)
RDW: 12.9 % (ref 11.5–15.5)
WBC: 18.3 10*3/uL — ABNORMAL HIGH (ref 4.0–10.5)

## 2017-11-10 LAB — COMPREHENSIVE METABOLIC PANEL
ALT: 22 U/L (ref 14–54)
AST: 23 U/L (ref 15–41)
Albumin: 3.1 g/dL — ABNORMAL LOW (ref 3.5–5.0)
Alkaline Phosphatase: 78 U/L (ref 38–126)
Anion gap: 15 (ref 5–15)
BUN: 24 mg/dL — ABNORMAL HIGH (ref 6–20)
CO2: 22 mmol/L (ref 22–32)
Calcium: 8.2 mg/dL — ABNORMAL LOW (ref 8.9–10.3)
Chloride: 99 mmol/L — ABNORMAL LOW (ref 101–111)
Creatinine, Ser: 1.2 mg/dL — ABNORMAL HIGH (ref 0.44–1.00)
GFR calc Af Amer: 54 mL/min — ABNORMAL LOW (ref >60)
GFR calc non Af Amer: 47 mL/min — ABNORMAL LOW (ref >60)
Glucose, Bld: 250 mg/dL — ABNORMAL HIGH (ref 65–99)
Potassium: 4.1 mmol/L (ref 3.5–5.1)
Sodium: 136 mmol/L (ref 135–145)
Total Bilirubin: 1.2 mg/dL (ref 0.3–1.2)
Total Protein: 6.6 g/dL (ref 6.5–8.1)

## 2017-11-10 LAB — I-STAT CG4 LACTIC ACID, ED: Lactic Acid, Venous: 1.33 mmol/L (ref 0.5–1.9)

## 2017-11-10 MED ORDER — ONDANSETRON HCL 4 MG PO TABS: 4.0000 mg | ORAL_TABLET | Freq: Once | ORAL | Status: DC

## 2017-11-10 MED ORDER — ONDANSETRON 4 MG PO TBDP
4.0000 mg | ORAL_TABLET | Freq: Once | ORAL | Status: AC
  Administered 2017-11-10: 4 mg via ORAL
  Filled 2017-11-10: qty 1

## 2017-11-10 NOTE — ED Triage Notes (Addendum)
[facility] and came here stating that the antibiotics she was given there have not worked - took one dose. Reports nausea and vomiting x3 days. Dxed with a UTI. Also reporting constipation and given magnesium citrate, some bleeding noted afterwards. [facility] wanted to admit patient but family didn't want her admitted there, instead went to get her RX and brought her here.

## 2017-11-11 ENCOUNTER — Emergency Department (HOSPITAL_COMMUNITY): Payer: Medicare Other

## 2017-11-11 ENCOUNTER — Other Ambulatory Visit: Payer: Self-pay

## 2017-11-11 DIAGNOSIS — I6930 Unspecified sequelae of cerebral infarction: Secondary | ICD-10-CM

## 2017-11-11 DIAGNOSIS — R6510 Systemic inflammatory response syndrome (SIRS) of non-infectious origin without acute organ dysfunction: Secondary | ICD-10-CM

## 2017-11-11 DIAGNOSIS — E119 Type 2 diabetes mellitus without complications: Secondary | ICD-10-CM

## 2017-11-11 DIAGNOSIS — R112 Nausea with vomiting, unspecified: Secondary | ICD-10-CM

## 2017-11-11 DIAGNOSIS — D62 Acute posthemorrhagic anemia: Secondary | ICD-10-CM | POA: Diagnosis not present

## 2017-11-11 DIAGNOSIS — K5900 Constipation, unspecified: Secondary | ICD-10-CM | POA: Diagnosis not present

## 2017-11-11 DIAGNOSIS — K920 Hematemesis: Secondary | ICD-10-CM | POA: Diagnosis not present

## 2017-11-11 DIAGNOSIS — F411 Generalized anxiety disorder: Secondary | ICD-10-CM

## 2017-11-11 DIAGNOSIS — N183 Chronic kidney disease, stage 3 unspecified: Secondary | ICD-10-CM | POA: Diagnosis present

## 2017-11-11 DIAGNOSIS — I10 Essential (primary) hypertension: Secondary | ICD-10-CM | POA: Diagnosis not present

## 2017-11-11 DIAGNOSIS — Z7901 Long term (current) use of anticoagulants: Secondary | ICD-10-CM

## 2017-11-11 LAB — CBC
HCT: 30.6 % — ABNORMAL LOW (ref 36.0–46.0)
Hemoglobin: 9.8 g/dL — ABNORMAL LOW (ref 12.0–15.0)
MCH: 28.2 pg (ref 26.0–34.0)
MCHC: 32 g/dL (ref 30.0–36.0)
MCV: 87.9 fL (ref 78.0–100.0)
Platelets: 238 10*3/uL (ref 150–400)
RBC: 3.48 MIL/uL — ABNORMAL LOW (ref 3.87–5.11)
RDW: 13 % (ref 11.5–15.5)
WBC: 22 10*3/uL — ABNORMAL HIGH (ref 4.0–10.5)

## 2017-11-11 LAB — BASIC METABOLIC PANEL
Anion gap: 12 (ref 5–15)
BUN: 27 mg/dL — ABNORMAL HIGH (ref 6–20)
CO2: 22 mmol/L (ref 22–32)
Calcium: 7.6 mg/dL — ABNORMAL LOW (ref 8.9–10.3)
Chloride: 104 mmol/L (ref 101–111)
Creatinine, Ser: 1.24 mg/dL — ABNORMAL HIGH (ref 0.44–1.00)
GFR calc Af Amer: 52 mL/min — ABNORMAL LOW (ref >60)
GFR calc non Af Amer: 45 mL/min — ABNORMAL LOW (ref >60)
Glucose, Bld: 339 mg/dL — ABNORMAL HIGH (ref 65–99)
Potassium: 4 mmol/L (ref 3.5–5.1)
Sodium: 138 mmol/L (ref 135–145)

## 2017-11-11 LAB — URINALYSIS, ROUTINE W REFLEX MICROSCOPIC
Bilirubin Urine: NEGATIVE
Glucose, UA: >=500 mg/dL — AB
Ketones, ur: 20 mg/dL — AB
Leukocytes, UA: NEGATIVE
Nitrite: NEGATIVE
Protein, ur: 30 mg/dL — AB
Specific Gravity, Urine: 1.022 (ref 1.005–1.030)
pH: 5 (ref 5.0–8.0)

## 2017-11-11 LAB — GLUCOSE, CAPILLARY
Glucose-Capillary: 308 mg/dL — ABNORMAL HIGH (ref 65–99)
Glucose-Capillary: 252 mg/dL — ABNORMAL HIGH (ref 65–99)
Glucose-Capillary: 322 mg/dL — ABNORMAL HIGH (ref 65–99)
Glucose-Capillary: 251 mg/dL — ABNORMAL HIGH (ref 65–99)

## 2017-11-11 LAB — HEMOGLOBIN AND HEMATOCRIT, BLOOD
HCT: 29.7 % — ABNORMAL LOW (ref 36.0–46.0)
HCT: 28.9 % — ABNORMAL LOW (ref 36.0–46.0)
Hemoglobin: 9.6 g/dL — ABNORMAL LOW (ref 12.0–15.0)
Hemoglobin: 9.4 g/dL — ABNORMAL LOW (ref 12.0–15.0)

## 2017-11-11 LAB — POC OCCULT BLOOD, ED: Fecal Occult Bld: NEGATIVE

## 2017-11-11 LAB — I-STAT TROPONIN, ED: Troponin i, poc: 0.04 ng/mL (ref 0.00–0.08)

## 2017-11-11 LAB — I-STAT CG4 LACTIC ACID, ED: Lactic Acid, Venous: 1.68 mmol/L (ref 0.5–1.9)

## 2017-11-11 LAB — PROCALCITONIN: Procalcitonin: 0.12 ng/mL

## 2017-11-11 LAB — HIV ANTIBODY (ROUTINE TESTING W REFLEX): HIV Screen 4th Generation wRfx: NONREACTIVE

## 2017-11-11 MED ORDER — SODIUM CHLORIDE 0.9 % IV SOLN
2.0000 g | Freq: Once | INTRAVENOUS | Status: AC
  Administered 2017-11-11: 2 g via INTRAVENOUS
  Filled 2017-11-11: qty 2

## 2017-11-11 MED ORDER — SENNA 8.6 MG PO TABS
1.0000 | ORAL_TABLET | Freq: Two times a day (BID) | ORAL | Status: DC
  Administered 2017-11-11 – 2017-11-14 (×5): 8.6 mg via ORAL
  Filled 2017-11-11 (×6): qty 1

## 2017-11-11 MED ORDER — PANTOPRAZOLE SODIUM 40 MG IV SOLR
40.0000 mg | Freq: Two times a day (BID) | INTRAVENOUS | Status: DC
  Administered 2017-11-11 – 2017-11-13 (×4): 40 mg via INTRAVENOUS
  Filled 2017-11-11 (×6): qty 40

## 2017-11-11 MED ORDER — SODIUM CHLORIDE 0.9 % IV SOLN
1.0000 g | Freq: Three times a day (TID) | INTRAVENOUS | Status: DC
Start: 2017-11-11 — End: 2017-11-13
  Administered 2017-11-11 – 2017-11-13 (×4): 1 g via INTRAVENOUS
  Filled 2017-11-11 (×9): qty 1

## 2017-11-11 MED ORDER — BISACODYL 5 MG PO TBEC: 5.0000 mg | DELAYED_RELEASE_TABLET | Freq: Every day | ORAL | Status: DC | PRN

## 2017-11-11 MED ORDER — ARIPIPRAZOLE 5 MG PO TABS
15.0000 mg | ORAL_TABLET | Freq: Every day | ORAL | Status: DC
  Administered 2017-11-11 – 2017-11-14 (×3): 15 mg via ORAL
  Filled 2017-11-11 (×2): qty 2
  Filled 2017-11-11 (×2): qty 3

## 2017-11-11 MED ORDER — INSULIN ASPART 100 UNIT/ML ~~LOC~~ SOLN
0.0000 [IU] | Freq: Three times a day (TID) | SUBCUTANEOUS | Status: DC
  Administered 2017-11-11: 5 [IU] via SUBCUTANEOUS
  Administered 2017-11-11 (×2): 7 [IU] via SUBCUTANEOUS
  Administered 2017-11-12 (×3): 2 [IU] via SUBCUTANEOUS
  Administered 2017-11-13: 1 [IU] via SUBCUTANEOUS
  Administered 2017-11-14: 3 [IU] via SUBCUTANEOUS

## 2017-11-11 MED ORDER — ACETAMINOPHEN 325 MG PO TABS
650.0000 mg | ORAL_TABLET | Freq: Four times a day (QID) | ORAL | Status: DC | PRN
  Administered 2017-11-12: 650 mg via ORAL
  Filled 2017-11-11: qty 2

## 2017-11-11 MED ORDER — FLEET ENEMA 7-19 GM/118ML RE ENEM: 1.0000 | ENEMA | Freq: Once | RECTAL | Status: DC | PRN

## 2017-11-11 MED ORDER — ACETAMINOPHEN 650 MG RE SUPP: 650.0000 mg | Freq: Four times a day (QID) | RECTAL | Status: DC | PRN

## 2017-11-11 MED ORDER — SODIUM CHLORIDE 0.9 % IV SOLN
INTRAVENOUS | Status: AC
  Administered 2017-11-11: 04:00:00 via INTRAVENOUS

## 2017-11-11 MED ORDER — PANTOPRAZOLE SODIUM 40 MG PO TBEC
40.0000 mg | DELAYED_RELEASE_TABLET | Freq: Two times a day (BID) | ORAL | Status: DC
  Administered 2017-11-11: 40 mg via ORAL
  Filled 2017-11-11: qty 1

## 2017-11-11 MED ORDER — LISINOPRIL 20 MG PO TABS: 20.0000 mg | ORAL_TABLET | Freq: Every day | ORAL | Status: DC

## 2017-11-11 MED ORDER — INSULIN GLARGINE 100 UNIT/ML ~~LOC~~ SOLN
25.0000 [IU] | Freq: Every day | SUBCUTANEOUS | Status: DC
  Administered 2017-11-11 – 2017-11-12 (×2): 25 [IU] via SUBCUTANEOUS
  Filled 2017-11-11 (×2): qty 0.25

## 2017-11-11 MED ORDER — VANCOMYCIN HCL IN DEXTROSE 750-5 MG/150ML-% IV SOLN
750.0000 mg | Freq: Two times a day (BID) | INTRAVENOUS | Status: DC
  Administered 2017-11-11 – 2017-11-13 (×4): 750 mg via INTRAVENOUS
  Filled 2017-11-11 (×5): qty 150

## 2017-11-11 MED ORDER — SODIUM CHLORIDE 0.9% FLUSH
3.0000 mL | Freq: Two times a day (BID) | INTRAVENOUS | Status: DC
  Administered 2017-11-12 – 2017-11-14 (×4): 3 mL via INTRAVENOUS

## 2017-11-11 MED ORDER — SODIUM CHLORIDE 0.9 % IV BOLUS (SEPSIS)
1000.0000 mL | Freq: Once | INTRAVENOUS | Status: AC
  Administered 2017-11-11: 1000 mL via INTRAVENOUS

## 2017-11-11 MED ORDER — ONDANSETRON HCL 4 MG PO TABS: 4.0000 mg | ORAL_TABLET | Freq: Four times a day (QID) | ORAL | Status: DC | PRN

## 2017-11-11 MED ORDER — LEVOFLOXACIN IN D5W 750 MG/150ML IV SOLN
750.0000 mg | Freq: Once | INTRAVENOUS | Status: AC
  Administered 2017-11-11: 750 mg via INTRAVENOUS
  Filled 2017-11-11: qty 150

## 2017-11-11 MED ORDER — INSULIN ASPART 100 UNIT/ML ~~LOC~~ SOLN
3.0000 [IU] | Freq: Three times a day (TID) | SUBCUTANEOUS | Status: DC
  Administered 2017-11-12: 3 [IU] via SUBCUTANEOUS

## 2017-11-11 MED ORDER — FAMOTIDINE IN NACL 20-0.9 MG/50ML-% IV SOLN: 20.0000 mg | INTRAVENOUS | Status: DC

## 2017-11-11 MED ORDER — ONDANSETRON HCL 4 MG/2ML IJ SOLN: 4.0000 mg | Freq: Four times a day (QID) | INTRAMUSCULAR | Status: DC | PRN

## 2017-11-11 MED ORDER — INSULIN GLARGINE 100 UNIT/ML ~~LOC~~ SOLN
25.0000 [IU] | Freq: Every day | SUBCUTANEOUS | Status: DC
  Filled 2017-11-11: qty 0.25

## 2017-11-11 MED ORDER — MIRABEGRON ER 25 MG PO TB24: 25.0000 mg | ORAL_TABLET | Freq: Every day | ORAL | Status: DC

## 2017-11-11 MED ORDER — INSULIN ASPART 100 UNIT/ML ~~LOC~~ SOLN
0.0000 [IU] | Freq: Every day | SUBCUTANEOUS | Status: DC
  Administered 2017-11-11: 3 [IU] via SUBCUTANEOUS
  Administered 2017-11-13: 2 [IU] via SUBCUTANEOUS

## 2017-11-11 MED ORDER — BUPROPION HCL ER (SR) 150 MG PO TB12
150.0000 mg | ORAL_TABLET | Freq: Two times a day (BID) | ORAL | Status: DC
  Administered 2017-11-11 – 2017-11-14 (×7): 150 mg via ORAL
  Filled 2017-11-11 (×7): qty 1

## 2017-11-11 MED ORDER — LEVOFLOXACIN IN D5W 750 MG/150ML IV SOLN: 750.0000 mg | INTRAVENOUS | Status: DC

## 2017-11-11 MED ORDER — ATORVASTATIN CALCIUM 40 MG PO TABS
40.0000 mg | ORAL_TABLET | Freq: Every day | ORAL | Status: DC
  Administered 2017-11-11 – 2017-11-13 (×3): 40 mg via ORAL
  Filled 2017-11-11: qty 1
  Filled 2017-11-11 (×2): qty 2

## 2017-11-11 MED ORDER — METOPROLOL TARTRATE 12.5 MG HALF TABLET
12.5000 mg | ORAL_TABLET | Freq: Two times a day (BID) | ORAL | Status: DC
Start: 2017-11-11 — End: 2017-11-14
  Administered 2017-11-11 – 2017-11-14 (×7): 12.5 mg via ORAL
  Filled 2017-11-11 (×7): qty 1

## 2017-11-11 MED ORDER — CLONAZEPAM 0.5 MG PO TABS
0.5000 mg | ORAL_TABLET | Freq: Two times a day (BID) | ORAL | Status: DC
  Administered 2017-11-11 – 2017-11-13 (×5): 0.5 mg via ORAL
  Filled 2017-11-11 (×5): qty 1

## 2017-11-11 MED ORDER — INSULIN ASPART 100 UNIT/ML ~~LOC~~ SOLN: 3.0000 [IU] | Freq: Three times a day (TID) | SUBCUTANEOUS | Status: DC

## 2017-11-11 MED ORDER — VANCOMYCIN HCL IN DEXTROSE 1-5 GM/200ML-% IV SOLN
1000.0000 mg | Freq: Once | INTRAVENOUS | Status: AC
  Administered 2017-11-11: 1000 mg via INTRAVENOUS
  Filled 2017-11-11: qty 200

## 2017-11-11 MED ORDER — POLYETHYLENE GLYCOL 3350 17 G PO PACK: 17.0000 g | PACK | Freq: Every day | ORAL | Status: DC | PRN

## 2017-11-11 MED ORDER — INSULIN GLARGINE 100 UNIT/ML ~~LOC~~ SOLN: 20.0000 [IU] | Freq: Every day | SUBCUTANEOUS | Status: DC

## 2017-11-11 MED ORDER — PANTOPRAZOLE SODIUM 40 MG IV SOLR
40.0000 mg | Freq: Once | INTRAVENOUS | Status: AC
  Administered 2017-11-11: 40 mg via INTRAVENOUS
  Filled 2017-11-11: qty 40

## 2017-11-11 MED ORDER — BISACODYL 10 MG RE SUPP
10.0000 mg | Freq: Once | RECTAL | Status: DC
  Filled 2017-11-11: qty 1

## 2017-11-11 MED ORDER — TRAMADOL HCL 50 MG PO TABS
25.0000 mg | ORAL_TABLET | Freq: Two times a day (BID) | ORAL | Status: DC | PRN
  Administered 2017-11-11 – 2017-11-12 (×2): 50 mg via ORAL
  Filled 2017-11-11 (×2): qty 1

## 2017-11-11 NOTE — Progress Notes (Signed)
[facility]                                                                              Patient Demographics  Chloe Owens, is a 62 y.o. female, DOB - [birth year], HQI:[id number]  Admit date - 11/10/2017   Admitting Physician Briscoe Deutscherimothy S Opyd, MD  Outpatient Primary MD for the patient is Chloe Owens, Chloe G, MD  Outpatient specialists:   LOS - 0  days   Medical records reviewed and are as summarized below:    Chief Complaint  Patient presents with  . Nausea       Brief summary   Patient is a 62 year old female with history of CVA, cognitive and motor deficits, IDDM, anxiety, depression, hypertension presented with nausea and vomiting.  Per family has been vomiting since 6/5, recently appeared brownish and like coffee grounds.  CT of the abdomen in the outside ED, suggested constipation but no acute findings.  She was diagnosed with UTI and started on antibiotics and laxative. FOBT negative, CBC showed a leukocytosis, patient was placed on IV fluids, IV Protonix and broad-spectrum antibiotics.  Admitted for sirs.    Assessment & Plan    Principal Problem:   SIRS (systemic inflammatory response syndrome) (HCC) -Presented with nausea and vomiting, [facility] showed possible constipation otherwise no acute pathology -Still with leukocytosis, trending up, UA negative for UTI, follow blood cultures -Continue IV vancomycin and aztreonam, source unclear.  Chest x-ray showed minimal linear density left base like atelectasis -Continue IV fluid hydration  Active Problems: Nausea and vomiting, ?  Coffee-ground emesis with abdominal pain -Currently no nausea, vomiting, abdominal pain, possibly coffee-ground emesis from Mallory-Weiss tear - FOBT negative -Continue PPI, hemoglobin down to 9.8, at the time of admission to 14.7, possibly hemoconcentrated -Change to clear liquid diet, hold Plavix, will d/w GI   Diabetes mellitus, insulin-dependent,  uncontrolled with hyperglycemia, type II -will continue sliding scale insulin, Lantus, change to clear liquid diet -Follow hemoglobin A1c, added NovoLog meal coverage 3 units 3 times daily  History of CVA -Has residual cognitive and motor deficits. -Continue statin.  Currently Plavix on hold  Hypertension with sinus tachycardia -Hold lisinopril, placed on metoprolol 12.5 mg twice a day  CKD stage III Creatinine 1.2 on admission, at baseline Hold lisinopril, continue-IV fluids  Depression with anxiety Continue Abilify, Wellbutrin, Klonopin  Code Status: Full CODE STATUS DVT Prophylaxis:   SCD's Family Communication: Discussed in detail with the patient, all imaging results, lab results explained to the patient    Disposition Plan: Not medically ready  Time Spent in minutes   35 minutes  Procedures:  None  Consultants:   GI  Antimicrobials:   IV vancomycin 6/9  IV aztreonam 6/9  IV Levaquin 6/9   Medications  Scheduled Meds: . ARIPiprazole  15 mg Oral Daily  . atorvastatin  40 mg Oral q1800  . buPROPion  150 mg Oral BID WC  . clonazePAM  0.5 mg Oral BID  . insulin aspart  0-5 Units Subcutaneous QHS  . insulin aspart  0-9 Units Subcutaneous TID WC  . insulin aspart  3 Units  Subcutaneous TID WC  . insulin glargine  20 Units Subcutaneous QHS  . insulin glargine  25 Units Subcutaneous BH-q7a  . metoprolol tartrate  12.5 mg Oral BID  . mirabegron ER  25 mg Oral Daily  . pantoprazole  40 mg Oral BID AC  . senna  1 tablet Oral BID  . sodium chloride flush  3 mL Intravenous Q12H   Continuous Infusions: . sodium chloride 100 mL/hr at 11/11/17 0407  . aztreonam    . famotidine (PEPCID) IV    . [START ON 11/12/2017] levofloxacin (LEVAQUIN) IV    . vancomycin     PRN Meds:.acetaminophen **OR** acetaminophen, bisacodyl, ondansetron **OR** ondansetron (ZOFRAN) IV, polyethylene glycol, sodium phosphate, traMADol   Antibiotics   Anti-infectives (From admission,  onward)   Start     Dose/Rate Route Frequency Ordered Stop   11/12/17 2200  levofloxacin (LEVAQUIN) IVPB 750 mg     750 mg 100 mL/hr over 90 Minutes Intravenous Every 48 hours 11/11/17 0359     11/11/17 1800  vancomycin (VANCOCIN) IVPB 750 mg/150 ml premix     750 mg 150 mL/hr over 60 Minutes Intravenous Every 12 hours 11/11/17 0359     11/11/17 1000  aztreonam (AZACTAM) 1 Owens in sodium chloride 0.9 % 100 mL IVPB     1 Owens 200 mL/hr over 30 Minutes Intravenous Every 8 hours 11/11/17 0359     11/11/17 0130  levofloxacin (LEVAQUIN) IVPB 750 mg     750 mg 100 mL/hr over 90 Minutes Intravenous  Once 11/11/17 0127 11/11/17 0800   11/11/17 0130  aztreonam (AZACTAM) 2 Owens in sodium chloride 0.9 % 100 mL IVPB     2 Owens 200 mL/hr over 30 Minutes Intravenous  Once 11/11/17 0127 11/11/17 0406   11/11/17 0130  vancomycin (VANCOCIN) IVPB 1000 mg/200 mL premix     1,000 mg 200 mL/hr over 60 Minutes Intravenous  Once 11/11/17 0127 11/11/17 0506        Subjective:   Miria Cappelli was seen and examined today.  Not a very good historian due to cognitive deficits, states was having nausea and vomiting.  Currently no vomiting seen.  No chest pain, shortness of breath, fever or chills.  Objective:   Vitals:   11/11/17 0409 11/11/17 0500 11/11/17 0600 11/11/17 0745  BP:  (!) 146/92 130/76 (!) 158/80  Pulse:  (!) 125 (!) 119 (!) 129  Resp:  (!) 25 18 (!) 22  Temp: 97.8 F (36.6 C)   98.9 F (37.2 C)  TempSrc: Oral   Oral  SpO2:  95% 96% 97%  Weight:    86.9 kg (191 lb 9.3 oz)  Height:    5\' 6"  (1.676 m)    Intake/Output Summary (Last 24 hours) at 11/11/2017 1610 Last data filed at 11/11/2017 0538 Gross per 24 hour  Intake 2350 ml  Output -  Net 2350 ml     Wt Readings from Last 3 Encounters:  11/11/17 86.9 kg (191 lb 9.3 oz)  10/25/16 72.1 kg (159 lb)  05/24/16 75.7 kg (166 lb 12.8 oz)     Exam  General: Alert and oriented x self NAD  Eyes:   HEENT:  Atraumatic, normocephalic, normal  oropharynx  Cardiovascular: S1 S2 auscultated, tachycardia, RRR   Respiratory: CTAB  Gastrointestinal: Soft, nontender, nondistended, + bowel sounds  Ext: no pedal edema bilaterally  Neuro: no new focal neurological deficits  Musculoskeletal: No digital cyanosis, clubbing  Skin: No rashes  Psych: oriented to person  and place, has cognitive deficits baseline   Data Reviewed:  I have personally reviewed following labs and imaging studies  Micro Results No results found for this or any previous visit (from the past 240 hour(s)).  Radiology Reports Dg Chest 2 View  Result Date: 11/11/2017 CLINICAL DATA:  Nausea and vomiting 3 days. Diagnosed with UTI. On antibiotics. EXAM: CHEST - 2 VIEW COMPARISON:  11/10/2017 and 06/09/2017 FINDINGS: Lungs are adequately inflated with minimal linear atelectasis left base. No lobar consolidation or effusion. Cardiomediastinal silhouette is normal. Mild degenerate change of the spine. IMPRESSION: Minimal linear density left base likely atelectasis. Electronically Signed   By: Elberta Fortis M.D.   On: 11/11/2017 01:03    Lab Data:  CBC: Recent Labs  Lab 11/10/17 2039 11/11/17 0457  WBC 18.3* 22.0*  NEUTROABS 18.5*  --   HGB 14.7 9.8*  HCT 45.8 30.6*  MCV 85.8 87.9  PLT 163 238   Basic Metabolic Panel: Recent Labs  Lab 11/10/17 2039 11/11/17 0457  NA 136 138  K 4.1 4.0  CL 99* 104  CO2 22 22  GLUCOSE 250* 339*  BUN 24* 27*  CREATININE 1.20* 1.24*  CALCIUM 8.2* 7.6*   GFR: Estimated Creatinine Clearance: 50.9 mL/min (A) (by C-Owens formula based on SCr of 1.24 mg/dL (H)). Liver Function Tests: Recent Labs  Lab 11/10/17 2039  AST 23  ALT 22  ALKPHOS 78  BILITOT 1.2  PROT 6.6  ALBUMIN 3.1*   No results for input(s): LIPASE, AMYLASE in the last 168 hours. No results for input(s): AMMONIA in the last 168 hours. Coagulation Profile: No results for input(s): INR, PROTIME in the last 168 hours. Cardiac Enzymes: No results for  input(s): CKTOTAL, CKMB, CKMBINDEX, TROPONINI in the last 168 hours. BNP (last 3 results) No results for input(s): PROBNP in the last 8760 hours. HbA1C: No results for input(s): HGBA1C in the last 72 hours. CBG: Recent Labs  Lab 11/11/17 0825  GLUCAP 308*   Lipid Profile: No results for input(s): CHOL, HDL, LDLCALC, TRIG, CHOLHDL, LDLDIRECT in the last 72 hours. Thyroid Function Tests: No results for input(s): TSH, T4TOTAL, FREET4, T3FREE, THYROIDAB in the last 72 hours. Anemia Panel: No results for input(s): VITAMINB12, FOLATE, FERRITIN, TIBC, IRON, RETICCTPCT in the last 72 hours. Urine analysis:    Component Value Date/Time   COLORURINE YELLOW 11/11/2017 0155   APPEARANCEUR CLEAR 11/11/2017 0155   LABSPEC 1.022 11/11/2017 0155   PHURINE 5.0 11/11/2017 0155   GLUCOSEU >=500 (A) 11/11/2017 0155   HGBUR MODERATE (A) 11/11/2017 0155   BILIRUBINUR NEGATIVE 11/11/2017 0155   KETONESUR 20 (A) 11/11/2017 0155   PROTEINUR 30 (A) 11/11/2017 0155   UROBILINOGEN 1.0 06/03/2014 0440   NITRITE NEGATIVE 11/11/2017 0155   LEUKOCYTESUR NEGATIVE 11/11/2017 0155     Nolan Tuazon M.D. [facility] 11/11/2017, 9:38 AM  Pager: [phone] Between 7am to 7pm - call Pager - [phone]  After 7pm go to [email or web address] - password [email or web address]  Call night coverage person covering after 7pm

## 2017-11-11 NOTE — Progress Notes (Signed)
Pharmacy Antibiotic Note  Chloe Owens is a 62 y.o. female admitted on 11/10/2017 with SIRS/urosepsis.  Pharmacy has been consulted for Vancomycin aztreonam and Levaquin dosing.    Plan: Vancomycin 1 g IV as ordered, then 750 mg IV q12h Aztreonam 1 g IV q8h Levaquin 750 mg IV q48h  Height: 5\' 6"  (167.6 cm) Weight: 140 lb (63.5 kg) IBW/kg (Calculated) : 59.3  Temp (24hrs), Avg:98.9 F (37.2 C), Min:98.7 F (37.1 C), Max:99.1 F (37.3 C)  Recent Labs  Lab 11/10/17 2039 11/10/17 2048 11/11/17 0204  WBC 18.3*  --   --   CREATININE 1.20*  --   --   LATICACIDVEN  --  1.33 1.68    Estimated Creatinine Clearance: 44.3 mL/min (A) (by C-G formula based on SCr of 1.2 mg/dL (H)).    Allergies  Allergen Reactions  . Codeine Nausea And Vomiting and Rash  . Penicillins Rash    Eddie Candlebbott, Phil Corti Vernon 11/11/2017 3:53 AM

## 2017-11-11 NOTE — ED Notes (Signed)
Attempted report 

## 2017-11-11 NOTE — Consult Note (Addendum)
[facility] Consult: 9:55 AM 11/11/2017  LOS: 0 days    Referring Provider: Dr Rod Can  Primary Care Physician:  Richardean Chimera, MD in [place], [place] Primary Gastroenterologist:  Gentry Fitz.    Spouse: Arther Dames.  Mobile [phone]    Reason for Consultation:  Hematemesis and anemia.     HPI: Chloe Owens is a 62 y.o. female.  Hx GERD.  HTN.  IDDM. 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  On chronic Plavix and 81 ASA.  Post CVA dysphagia.  Echo 05/2016: LVEF 65 to 70%, grade 1 diastolic dysfx, MV heavily calcified with mild mitral regurge.  Depression, anxiety.  Previous ankle fx and surgery.     No previou EGD or colonoscopy  Vomiting began 6/6.  Black water appearance from the start.  No previous issues with vomiting, nausea, anorexia and she takes Protonix BID. Some upper abd pain.    6/6 or 6/7 seen at [facility] ED.  Dxd with UTI, Rx with an antibiotic.  CT sugg of constipation, no obstruction.  Treated with enema, no real stool resulted.  After discharge using Mag citrate and miralax also with no stool output.  vomiting contd.  Returned to [facility] ED yesterday and plan was for observation.  Pt's husband concerned with the knowledge that she was internally bleeding and might need "surgery" so he discharged her from [facility] ED and brought her to [facility] ED.   At home takes 400 mg Ibuprofen a few times a month for ankle pain.  Last Plavix and ASA was in AM 6/9.      Hgb 14.7 >> 9.8 overnight.  MCV 87.  Platelets normal.   FOBT negative BUN/creat 27/1.2.  Glucose 339.   Troponins and lactic acid not elevated.   CXR: Minimal linear density left base likely atelectasis   Last dose of Plavix was 6/9 AM but vomited this up.   Normally  swallows ok but sometimes coughs/gags with liquids.     Past Medical History:  Diagnosis Date  . Anxiety   . Bladder infection    hx of  . Depression   . Diabetes mellitus without complication (HCC)   . GERD (gastroesophageal reflux disease)   . Hyperlipidemia   . Hypertension   . Neuromuscular disorder (HCC)   . Stroke [facility])    05/2008    Past Surgical History:  Procedure Laterality Date  . ANKLE CLOSED REDUCTION  04/15/2012   Procedure: CLOSED REDUCTION ANKLE;  Surgeon: Senaida Lange, MD;  Location: [facility] OR;  Service: Orthopedics;  Laterality: Left;  . CARPAL TUNNEL RELEASE     bilaterally  . ORIF ANKLE FRACTURE  05/09/2012   Procedure: OPEN REDUCTION INTERNAL FIXATION (ORIF) ANKLE FRACTURE;  Surgeon: Toni Arthurs, MD;  Location: [facility] OR;  Service: Orthopedics;  Laterality: Left;  ORIF VERSES EXFIX LEFT ANKLE FRACTURE    Prior to Admission medications   Medication Sig Start Date End Date Taking? Authorizing Provider  ARIPiprazole (ABILIFY) 30 MG tablet  Take 0.5 tablets (15 mg total) by mouth daily. 05/26/16   Love, Evlyn KannerPamela S, PA-C  atorvastatin (LIPITOR) 40 MG tablet Take 1 tablet (40 mg total) by mouth daily at 6 PM. 05/09/16   Richarda OverlieAbrol, Nayana, MD  buPROPion (WELLBUTRIN SR) 150 MG 12 hr tablet Take 150 mg by mouth 2 (two) times daily. 03/27/16   [provider]  clonazePAM (KLONOPIN) 0.5 MG tablet Take 1 tablet (0.5 mg total) by mouth 2 (two) times daily. 05/09/16   Richarda OverlieAbrol, Nayana, MD  clopidogrel (PLAVIX) 75 MG tablet Take 1 tablet (75 mg total) by mouth daily. 05/26/16   Love, Evlyn KannerPamela S, PA-C  Insulin Glargine (BASAGLAR KWIKPEN) 100 UNIT/ML SOPN Use 25 units with breakfast and 20 units at bedtime. Patient taking differently: Inject 45 Units into the skin. Use 45 units with breakfast.  Is taking Lantus. 05/26/16   Love, Evlyn KannerPamela S, PA-C  JANUVIA 100 MG tablet Take 100 mg by mouth every evening. 05/02/16   [provider]  lisinopril (PRINIVIL,ZESTRIL) 20 MG tablet Take 1  tablet (20 mg total) by mouth daily. 05/26/16   Love, Evlyn KannerPamela S, PA-C  Mirabegron (MYRBETRIQ PO) Take by mouth daily.    [provider]  ondansetron (ZOFRAN-ODT) 8 MG disintegrating tablet TAKE 1 TABLET BY MOUTH FOUR TIMES DAILY AS NEEDED FOR NAUSEA 06/22/16   [provider]  pantoprazole (PROTONIX) 40 MG tablet Take 1 tablet (40 mg total) by mouth 2 (two) times daily before a meal. 05/26/16   Love, Evlyn KannerPamela S, PA-C  polyethylene glycol (MIRALAX / GLYCOLAX) packet Take 17 g by mouth daily. Patient taking differently: Take 17 g by mouth daily as needed.  05/26/16   Love, Evlyn KannerPamela S, PA-C  senna-docusate (SENOKOT-S) 8.6-50 MG tablet Take 2 tablets by mouth at bedtime. 06/20/16   Marcello FennelPatel, Ankit Anil, MD  traMADol (ULTRAM) 50 MG tablet Take 0.5-1 tablets (25-50 mg total) by mouth every 12 (twelve) hours as needed for moderate pain (or Headache unrelieved by tylenol). 05/26/16   Jacquelynn CreeLove, Pamela S, PA-C    Scheduled Meds: . ARIPiprazole  15 mg Oral Daily  . atorvastatin  40 mg Oral q1800  . buPROPion  150 mg Oral BID WC  . clonazePAM  0.5 mg Oral BID  . insulin aspart  0-5 Units Subcutaneous QHS  . insulin aspart  0-9 Units Subcutaneous TID WC  . insulin aspart  3 Units Subcutaneous TID WC  . insulin glargine  25 Units Subcutaneous BH-q7a  . metoprolol tartrate  12.5 mg Oral BID  . mirabegron ER  25 mg Oral Daily  . pantoprazole  40 mg Oral BID AC  . senna  1 tablet Oral BID  . sodium chloride flush  3 mL Intravenous Q12H   Infusions: . sodium chloride 100 mL/hr at 11/11/17 0407  . aztreonam    . famotidine (PEPCID) IV    . [START ON 11/12/2017] levofloxacin (LEVAQUIN) IV    . vancomycin     PRN Meds: acetaminophen **OR** acetaminophen, bisacodyl, ondansetron **OR** ondansetron (ZOFRAN) IV, polyethylene glycol, sodium phosphate, traMADol   Allergies as of 11/10/2017 - Review Complete 11/10/2017  Allergen Reaction Noted  . Codeine Nausea And Vomiting and Rash 04/14/2012  .  Penicillins Rash 04/14/2012    Family History  Problem Relation Age of Onset  . CAD Father   . CAD Mother   . Cancer - Other Mother        Breast  . Cancer - Other Brother  Unknown type    Social History   Socioeconomic History  . Marital status: Married    Spouse name: Not on file  . Number of children: Not on file  . Years of education: Not on file  . Highest education level: Not on file  Occupational History  . Not on file  Social Needs  . Financial resource strain: Not on file  . Food insecurity:    Worry: Not on file    Inability: Not on file  . Transportation needs:    Medical: Not on file    Non-medical: Not on file  Tobacco Use  . Smoking status: Former Smoker    Packs/day: 1.00    Types: Cigarettes    Last attempt to quit: 04/15/2012    Years since quitting: 5.5  . Smokeless tobacco: Never Used  Substance and Sexual Activity  . Alcohol use: No  . Drug use: No  . Sexual activity: Never  Lifestyle  . Physical activity:    Days per week: Not on file    Minutes per session: Not on file  . Stress: Not on file  Relationships  . Social connections:    Talks on phone: Not on file    Gets together: Not on file    Attends religious service: Not on file    Active member of club or organization: Not on file    Attends meetings of clubs or organizations: Not on file    Relationship status: Not on file  . Intimate partner violence:    Fear of current or ex partner: Not on file    Emotionally abused: Not on file    Physically abused: Not on file    Forced sexual activity: Not on file  Other Topics Concern  . Not on file  Social History Narrative  . Not on file    REVIEW OF SYSTEMS: Constitutional: Normal daily activity is walking a bit with her walker.  Rests in a recliner during the day.  Sleeps on the couch at night because her husband is afraid she could fall out of the bed which is higher off the ground. ENT:  No nose bleeds Pulm: No trouble  breathing.  No cough. CV:  No palpitations, no LE edema.  No chest pain GU:  No hematuria, no frequency GI:  Per HPI Heme: No unusual bleeding or bruising. Transfusions: None. Neuro:  No headaches, no peripheral tingling or numbness Derm:  No itching, no rash or sores.  Endocrine:  No sweats or chills.  No polyuria or dysuria Immunization: Not queried. Travel:  None beyond local counties in last few months.    PHYSICAL EXAM: Vital signs in last 24 hours: Vitals:   11/11/17 0600 11/11/17 0745  BP: 130/76 (!) 158/80  Pulse: (!) 119 (!) 129  Resp: 18 (!) 22  Temp:  98.9 F (37.2 C)  SpO2: 96% 97%   Wt Readings from Last 3 Encounters:  11/11/17 191 lb 9.3 oz (86.9 kg)  10/25/16 159 lb (72.1 kg)  05/24/16 166 lb 12.8 oz (75.7 kg)    General: Patient does not look acutely ill.  She is neurologically impaired and has difficulty speaking.  She is awake and alert. Head: No facial asymmetry or swelling.  No signs of head trauma. Eyes: No scleral icterus.  No conjunctival pallor.   Ears: Does not appear to be hard of hearing. Nose: No congestion or discharge. Mouth: Moist, pink, clear mucosa.  Tongue midline.  Good dentition. Neck: No  JVD Lungs: Clear bilaterally.  No cough.  No labored breathing. Heart: Tachycardia into the 1 teens.  Regular.  No murmurs rubs gallops.  S1, S2 present. Abdomen: Nondistended, nontender.  Active bowel sounds.  No HSM, bruits, masses, organomegaly..   Rectal: No stool in the vault.  A smear of brownish material tests FOBT negative.  No masses or tenderness.  No sacral decubitus. Musc/Skeltl: No joint redness, swelling or significant deformity. Extremities: No CCE. Neurologic: Patient cannot tell me anything other than her name.  She cannot tell me her maiden name.  Not tell me her date of birth.  She does follow commands. Skin: No sores, rashes, itching. Tattoos: None. Nodes: No cervical or inguinal adenopathy. Psych: Flat affect.  Intake/Output  from previous day: 06/08 0701 - 06/09 0700 In: 2350 [I.V.:100; IV Piggyback:2250] Out: -  Intake/Output this shift: No intake/output data recorded.  LAB RESULTS: Recent Labs    11/10/17 2039 11/11/17 0457  WBC 18.3* 22.0*  HGB 14.7 9.8*  HCT 45.8 30.6*  PLT 163 238   BMET Lab Results  Component Value Date   NA 138 11/11/2017   NA 136 11/10/2017   NA 136 05/24/2016   K 4.0 11/11/2017   K 4.1 11/10/2017   K 3.6 05/24/2016   CL 104 11/11/2017   CL 99 (L) 11/10/2017   CL 102 05/24/2016   CO2 22 11/11/2017   CO2 22 11/10/2017   CO2 25 05/24/2016   GLUCOSE 339 (H) 11/11/2017   GLUCOSE 250 (H) 11/10/2017   GLUCOSE 414 (H) 05/26/2016   BUN 27 (H) 11/11/2017   BUN 24 (H) 11/10/2017   BUN 14 05/24/2016   CREATININE 1.24 (H) 11/11/2017   CREATININE 1.20 (H) 11/10/2017   CREATININE 1.19 (H) 05/24/2016   CALCIUM 7.6 (L) 11/11/2017   CALCIUM 8.2 (L) 11/10/2017   CALCIUM 9.9 05/24/2016   LFT Recent Labs    11/10/17 2039  PROT 6.6  ALBUMIN 3.1*  AST 23  ALT 22  ALKPHOS 78  BILITOT 1.2   PT/INR Lab Results  Component Value Date   INR 1.0 05/25/2008   Hepatitis Panel No results for input(s): HEPBSAG, HCVAB, HEPAIGM, HEPBIGM in the last 72 hours. C-Diff No components found for: CDIFF Lipase     Component Value Date/Time   LIPASE 22 05/23/2014 1444    Drugs of Abuse     Component Value Date/Time   LABOPIA NONE DETECTED 05/08/2016 1248   COCAINSCRNUR NONE DETECTED 05/08/2016 1248   COCAINSCRNUR NEGATIVE 05/25/2008 2120   LABBENZ NONE DETECTED 05/08/2016 1248   LABBENZ NEGATIVE 05/25/2008 2120   AMPHETMU NONE DETECTED 05/08/2016 1248   THCU NONE DETECTED 05/08/2016 1248   LABBARB NONE DETECTED 05/08/2016 1248     RADIOLOGY STUDIES: Dg Chest 2 View  Result Date: 11/11/2017 CLINICAL DATA:  Nausea and vomiting 3 days. Diagnosed with UTI. On antibiotics. EXAM: CHEST - 2 VIEW COMPARISON:  11/10/2017 and 06/09/2017 FINDINGS: Lungs are adequately inflated with  minimal linear atelectasis left base. No lobar consolidation or effusion. Cardiomediastinal silhouette is normal. Mild degenerate change of the spine. IMPRESSION: Minimal linear density left base likely atelectasis. Electronically Signed   By: Elberta Fortis M.D.   On: 11/11/2017 01:03     IMPRESSION:   *   Coffee like emesis.   Acute anemia.  Suspect dilutional element.   R/o MW tear, esophagitis, gastritis.  However acid-peptic causes less likely given BID   *   Constipation.  *    Recent dx  of UTI, started on unknown abx.  currently U/A not convincing for UTI but +++ glucosuria.  On aztreonam/vancomycin.    *    IDDM.  Historically poorly controlled.   *   Chronic Plavix, last dose 6/8 in AM, which she vomited up.   Plavix indication in previous CVAs.      PLAN:     *   Will need EGD, timing to be determined.  Continue BID oral PPI, IV Pepcid.    *  CBC, Hgb A1c in AM.     Jennye Moccasin  11/11/2017, 9:55 AM Phone [phone]   I have reviewed the entire case in detail with the above APP and discussed the plan in detail.  Therefore, I agree with the diagnoses recorded above.  I personally examined the patient  My additional thoughts are as follows:  She has a pulse 120, but is nontoxic-appearing.  She currently denies abdominal pain, and has had no witnessed hematemesis nor passage of black or bloody stool since admission here last evening.  I performed a rectal exam with the assistance of the nursing tech Megan to position the patient, and the patient has a copious amount of brown, soft stool in the rectum. Abdomen is soft with no focal tenderness, nondistended, normal active bowel sounds.  Hematemesis that appears to have stopped.  She is on aspirin, ibuprofen and Plavix, recent concern for peptic ulcer disease.  Her husband also states she takes a PPI regularly for reflux, and since she is bedbound, she might have severe reflux esophagitis as a cause.  Anemia that is  likely multifactorial and of unknown chronicity.  Her admission hemoglobin of 14.7 seems likely from an depletion, and I think she remains volume depleted based on current evaluation.  Volume resuscitation seems to have brought her hemoglobin down to what I wonder might be her baseline.  It is macrocytic, implying chronicity.  Nausea and vomiting of several days is of unclear cause, but might at least in part be due to uncontrolled hyperglycemia.  While she has been acutely constipated for several days, she does not have exam or reported radiographic signs of obstruction.   Plan: Every 8 hour hemoglobin and hematocrit, transfuse as needed per parameters of the internal medicine service. Full liquid diet Twice daily IV Protonix, does not need additional Pepcid Dulcolax suppository and a repeat x1 within 2 hours if no output.  Followed by 2 warm, 1000 cc tap water enemas.  Further attention to UTI and volume status, which I think is contributing to her resting tachycardia.  She will need an upper endoscopy, needs improvement in glycemic control, volume resuscitation before proceeding.  I had an initial discussion about this with her husband. It would be an increased risk procedure given the severity of her multiple medical issues.  Revisit that day today.  Charlie Pitter III Office:[phone]

## 2017-11-11 NOTE — ED Notes (Signed)
Pepcid overdue, remains unverified by pharmacy

## 2017-11-11 NOTE — ED Provider Notes (Addendum)
[facility] EMERGENCY DEPARTMENT Provider Note   CSN: [id number] Arrival date & time: 11/10/17  2011     History   Chief Complaint Chief Complaint  Patient presents with  . Nausea    HPI Chloe Owens is a 62 y.o. female.  62 year old female with a history of diabetes, hypertension, CVA, dyslipidemia, esophageal reflux presents to the emergency department for evaluation of vomiting.  Husband reports that the patient has experienced vomiting over the past 3 days.  Emesis has been dark, coffee-ground in nature.  Patient also complaining of upper abdominal pain intermittently with nausea.  She was seen at [facility] on 11/09/2017.  She had a CT scan at this time which did not show an obstruction.  It did suggest constipation for which patient was given an enema.  She has since taken magnesium citrate at home in addition to [facility] with very little bowel movements.  Patient also was diagnosed with a urinary tract infection and prescribed antibiotics.  Husband reports that patient has been compliant with this antibiotic.  She has not had any known fevers.  Currently, the patient denies abdominal pain.  She is very resistant to answering questions secondary to agitation.  The patient states that she does not want to be in the hospital and she wishes to go home.  She is on chronic Plavix and aspirin.  No history of abdominal surgeries.     Past Medical History:  Diagnosis Date  . Anxiety   . Bladder infection    hx of  . Depression   . Diabetes mellitus without complication (HCC)   . GERD (gastroesophageal reflux disease)   . Hyperlipidemia   . Hypertension   . Neuromuscular disorder (HCC)   . Stroke [facility])    05/2008    Patient Active Problem List   Diagnosis Date Noted  . SIRS (systemic inflammatory response syndrome) (HCC) 11/11/2017  . CKD (chronic kidney disease), stage III (HCC) 11/11/2017  . Labile blood pressure   . Labile blood glucose   . Pain   .  Diabetes mellitus type 2 in nonobese (HCC)   . Constipation due to pain medication   . Cognitive deficit, post-stroke   . Adjustment disorder with mixed anxiety and depressed mood   . Nausea without vomiting   . Hemiparesis of right nondominant side due to cerebral infarction   . Dysarthria, post-stroke   . Hemiparesis of left nondominant side due to cerebral infarction   . History of CVA with residual deficit   . Benign essential HTN   . Non-rheumatic mitral regurgitation   . Tobacco abuse   . Leukocytosis   . Dysphagia, post-stroke   . Generalized anxiety disorder 05/07/2016  . Ankle fracture, left 04/14/2012  . Hyperlipidemia 04/14/2012    Past Surgical History:  Procedure Laterality Date  . ANKLE CLOSED REDUCTION  04/15/2012   Procedure: CLOSED REDUCTION ANKLE;  Surgeon: Senaida Lange, MD;  Location: [facility] OR;  Service: Orthopedics;  Laterality: Left;  . CARPAL TUNNEL RELEASE     bilaterally  . ORIF ANKLE FRACTURE  05/09/2012   Procedure: OPEN REDUCTION INTERNAL FIXATION (ORIF) ANKLE FRACTURE;  Surgeon: Toni Arthurs, MD;  Location: [facility] OR;  Service: Orthopedics;  Laterality: Left;  ORIF VERSES EXFIX LEFT ANKLE FRACTURE     OB History   None      Home Medications    Prior to Admission medications   Medication Sig Start Date End Date Taking? Authorizing Provider  ARIPiprazole (ABILIFY)  30 MG tablet Take 0.5 tablets (15 mg total) by mouth daily. 05/26/16   Love, Evlyn KannerPamela S, PA-C  atorvastatin (LIPITOR) 40 MG tablet Take 1 tablet (40 mg total) by mouth daily at 6 PM. 05/09/16   Richarda OverlieAbrol, Nayana, MD  buPROPion (WELLBUTRIN SR) 150 MG 12 hr tablet Take 150 mg by mouth 2 (two) times daily. 03/27/16   [provider]  clonazePAM (KLONOPIN) 0.5 MG tablet Take 1 tablet (0.5 mg total) by mouth 2 (two) times daily. 05/09/16   Richarda OverlieAbrol, Nayana, MD  clopidogrel (PLAVIX) 75 MG tablet Take 1 tablet (75 mg total) by mouth daily. 05/26/16   Love, Evlyn KannerPamela S, PA-C  Insulin Glargine (BASAGLAR  KWIKPEN) 100 UNIT/ML SOPN Use 25 units with breakfast and 20 units at bedtime. Patient taking differently: Inject 45 Units into the skin. Use 45 units with breakfast.  Is taking Lantus. 05/26/16   Love, Evlyn KannerPamela S, PA-C  JANUVIA 100 MG tablet Take 100 mg by mouth every evening. 05/02/16   [provider]  lisinopril (PRINIVIL,ZESTRIL) 20 MG tablet Take 1 tablet (20 mg total) by mouth daily. 05/26/16   Love, Evlyn KannerPamela S, PA-C  Mirabegron (MYRBETRIQ PO) Take by mouth daily.    [provider]  ondansetron (ZOFRAN-ODT) 8 MG disintegrating tablet TAKE 1 TABLET BY MOUTH FOUR TIMES DAILY AS NEEDED FOR NAUSEA 06/22/16   [provider]  pantoprazole (PROTONIX) 40 MG tablet Take 1 tablet (40 mg total) by mouth 2 (two) times daily before a meal. 05/26/16   Love, Evlyn KannerPamela S, PA-C  polyethylene glycol (MIRALAX / GLYCOLAX) packet Take 17 g by mouth daily. Patient taking differently: Take 17 g by mouth daily as needed.  05/26/16   Love, Evlyn KannerPamela S, PA-C  senna-docusate (SENOKOT-S) 8.6-50 MG tablet Take 2 tablets by mouth at bedtime. 06/20/16   Marcello FennelPatel, Ankit Anil, MD  traMADol (ULTRAM) 50 MG tablet Take 0.5-1 tablets (25-50 mg total) by mouth every 12 (twelve) hours as needed for moderate pain (or Headache unrelieved by tylenol). 05/26/16   Jacquelynn CreeLove, Pamela S, PA-C    Family History Family History  Problem Relation Age of Onset  . CAD Father   . CAD Mother   . Cancer - Other Mother        Breast  . Cancer - Other Brother        Unknown type    Social History Social History   Tobacco Use  . Smoking status: Former Smoker    Packs/day: 1.00    Types: Cigarettes    Last attempt to quit: 04/15/2012    Years since quitting: 5.5  . Smokeless tobacco: Never Used  Substance Use Topics  . Alcohol use: No  . Drug use: No     Allergies   Codeine and Penicillins   Review of Systems Review of Systems Ten systems reviewed and are negative for acute change, except as noted in the HPI.     Physical Exam Updated Vital Signs BP 136/62   Pulse (!) 118   Temp 98.7 F (37.1 C) (Rectal)   Resp 16   Ht 5\' 6"  (1.676 m)   Wt 63.5 kg (140 lb)   SpO2 99%   BMI 22.60 kg/m   Physical Exam  Constitutional: She is oriented to person, place, and time. She appears well-developed and well-nourished. No distress.  Slightly pale in appearance. Agitated.   HENT:  Head: Normocephalic and atraumatic.  Eyes: Conjunctivae and EOM are normal. No scleral icterus.  Neck: Normal range of motion.  Cardiovascular: Regular rhythm and intact distal pulses.  Tachycardia  Pulmonary/Chest: Effort normal. No stridor. No respiratory distress. She has no wheezes.  Lungs CTAB. Patient taken off supplemental oxygen; SpO2 94-97% on room air.  Abdominal: Soft. She exhibits no mass. There is no guarding.  Soft, obese abdomen. No TTP appreciated on exam. No rigidity or peritoneal signs.  Genitourinary:  Genitourinary Comments: Normal rectal tone. No melena or hematochezia on DRE.  Musculoskeletal: Normal range of motion.  Neurological: She is alert and oriented to person, place, and time. She exhibits normal muscle tone. Coordination normal.  GCS 15. Speech is goal oriented. Does seems slightly confused, but answers questions appropriately. Moving all extremities spontaneously. No focal deficits noted.  Skin: Skin is warm and dry. No rash noted. She is not diaphoretic. No erythema. No pallor.  Psychiatric: She has a normal mood and affect. Her behavior is normal.  Nursing note and vitals reviewed.    ED Treatments / Results  Labs (all labs ordered are listed, but only abnormal results are displayed) Labs Reviewed  COMPREHENSIVE METABOLIC PANEL - Abnormal; Notable for the following components:      Result Value   Chloride 99 (*)    Glucose, Bld 250 (*)    BUN 24 (*)    Creatinine, Ser 1.20 (*)    Calcium 8.2 (*)    Albumin 3.1 (*)    GFR calc non Af Amer 47 (*)    GFR calc Af Amer 54 (*)     All other components within normal limits  CBC WITH DIFFERENTIAL/PLATELET - Abnormal; Notable for the following components:   WBC 18.3 (*)    RBC 5.34 (*)    Neutro Abs 18.5 (*)    Monocytes Absolute 1.4 (*)    All other components within normal limits  URINALYSIS, ROUTINE W REFLEX MICROSCOPIC - Abnormal; Notable for the following components:   Glucose, UA >=500 (*)    Hgb urine dipstick MODERATE (*)    Ketones, ur 20 (*)    Protein, ur 30 (*)    Bacteria, UA RARE (*)    All other components within normal limits  CULTURE, BLOOD (ROUTINE X 2)  CULTURE, BLOOD (ROUTINE X 2)  URINE CULTURE  HIV ANTIBODY (ROUTINE TESTING)  BASIC METABOLIC PANEL  CBC  I-STAT CG4 LACTIC ACID, ED  I-STAT CG4 LACTIC ACID, ED  I-STAT TROPONIN, ED  POC OCCULT BLOOD, ED    EKG EKG Interpretation  Date/Time:  Sunday November 11 2017 02:03:33 EDT Ventricular Rate:  114 PR Interval:    QRS Duration: 112 QT Interval:  356 QTC Calculation: 491 R Axis:   51 Text Interpretation:  Sinus tachycardia Borderline intraventricular conduction delay Low voltage, precordial leads Borderline prolonged QT interval Confirmed by Ross Marcus ([id number]) on 11/11/2017 2:41:17 AM   Radiology Dg Chest 2 View  Result Date: 11/11/2017 CLINICAL DATA:  Nausea and vomiting 3 days. Diagnosed with UTI. On antibiotics. EXAM: CHEST - 2 VIEW COMPARISON:  11/10/2017 and 06/09/2017 FINDINGS: Lungs are adequately inflated with minimal linear atelectasis left base. No lobar consolidation or effusion. Cardiomediastinal silhouette is normal. Mild degenerate change of the spine. IMPRESSION: Minimal linear density left base likely atelectasis. Electronically Signed   By: Elberta Fortis M.D.   On: 11/11/2017 01:03     **CT ABD/PELVIS IMAGING ABLE TO VIEW IN PACS FROM 11/09/17**   Procedures Procedures (including critical care time)  Medications Ordered in ED Medications  levofloxacin (LEVAQUIN) IVPB 750 mg (750 mg Intravenous New Bag/Given  11/11/17 0309)  vancomycin (VANCOCIN) IVPB 1000 mg/200 mL premix (has no administration in time range)  ARIPiprazole (ABILIFY) tablet 15 mg (has no administration in time range)  atorvastatin (LIPITOR) tablet 40 mg (has no administration in time range)  buPROPion (WELLBUTRIN SR) 12 hr tablet 150 mg (has no administration in time range)  clonazePAM (KLONOPIN) tablet 0.5 mg (has no administration in time range)  lisinopril (PRINIVIL,ZESTRIL) tablet 20 mg (has no administration in time range)  mirabegron ER (MYRBETRIQ) tablet 25 mg (has no administration in time range)  pantoprazole (PROTONIX) EC tablet 40 mg (has no administration in time range)  traMADol (ULTRAM) tablet 25-50 mg (has no administration in time range)  sodium chloride flush (NS) 0.9 % injection 3 mL (has no administration in time range)  0.9 %  sodium chloride infusion (has no administration in time range)  acetaminophen (TYLENOL) tablet 650 mg (has no administration in time range)    Or  acetaminophen (TYLENOL) suppository 650 mg (has no administration in time range)  senna (SENOKOT) tablet 8.6 mg (has no administration in time range)  polyethylene glycol (MIRALAX / GLYCOLAX) packet 17 g (has no administration in time range)  bisacodyl (DULCOLAX) EC tablet 5 mg (has no administration in time range)  sodium phosphate (FLEET) 7-19 GM/118ML enema 1 enema (has no administration in time range)  ondansetron (ZOFRAN) tablet 4 mg (has no administration in time range)    Or  ondansetron (ZOFRAN) injection 4 mg (has no administration in time range)  insulin glargine (LANTUS) injection 25 Units (has no administration in time range)  insulin aspart (novoLOG) injection 0-9 Units (has no administration in time range)  insulin aspart (novoLOG) injection 0-5 Units (has no administration in time range)  famotidine (PEPCID) IVPB 20 mg premix (has no administration in time range)  ondansetron (ZOFRAN-ODT) disintegrating tablet 4 mg (4 mg Oral  Given 11/10/17 2031)  sodium chloride 0.9 % bolus 1,000 mL (0 mLs Intravenous Stopped 11/11/17 0310)    And  sodium chloride 0.9 % bolus 1,000 mL (1,000 mLs Intravenous New Bag/Given 11/11/17 0307)  pantoprazole (PROTONIX) injection 40 mg (40 mg Intravenous Given 11/11/17 0207)  aztreonam (AZACTAM) 2 g in sodium chloride 0.9 % 100 mL IVPB (2 g Intravenous New Bag/Given 11/11/17 0202)    CRITICAL CARE Performed by: Antony Madura   Total critical care time: 35 minutes  Critical care time was exclusive of separately billable procedures and treating other patients.  Critical care was necessary to treat or prevent imminent or life-threatening deterioration.  Critical care was time spent personally by me on the following activities: development of treatment plan with patient and/or surrogate as well as nursing, discussions with consultants, evaluation of patient's response to treatment, examination of patient, obtaining history from patient or surrogate, ordering and performing treatments and interventions, ordering and review of laboratory studies, ordering and review of radiographic studies, pulse oximetry and re-evaluation of patient's condition.   Initial Impression / Assessment and Plan / ED Course  I have reviewed the triage vital signs and the nursing notes.  Pertinent labs & imaging results that were available during my care of the patient were reviewed by me and considered in my medical decision making (see chart for details).     62 year old female presents to the emergency department for epigastric abdominal pain.  Husband states that patient has complained of the pain being "burning" in nature.  Symptoms associated with nausea and vomiting.  Family characterizes the emesis as more coffee-ground in color.  The patient has not had any visualized emesis while in the emergency department.  She is very difficult to obtain a history from secondary to irritation about being in the ED.  She has no  reproducible abdominal tenderness on exam.  Clinical picture is complex.  Based on history, there is concern for possible upper GI bleed.  This could be the cause of patient's leukocytosis and elevated BUN.  Work-up, however, with negative Hemoccult and stable hemoglobin.  Bowel obstruction considered as patient has not had a bowel movement since her symptoms began.  She was previously evaluated at an outside hospital with a CT of her abdomen.  There is no evidence of obstruction on this imaging, though constipation appreciated.  Unable to completely exclude obstruction secondary to ileus.  Cardiac etiology considered in light of epigastric pain. Troponin negative and EKG without evidence of STEMI.  Family reported diagnosis of UTI when the patient was seen at [facility].  Given tachycardia and leukocytosis, she was started on antibiotics pending urinalysis.  UTI today is more suggestive of contamination.  Lactate reassuring.  Blood cultures pending.  CXR negative for other infectious concerns.  Given SIRS criteria with persistent nausea and vomiting, I believe the patient would benefit from at least observation admission.  If vomiting persists and is found to be heme positive, EGD may be considered. Case discussed with Dr. Antionette Char of [facility] who will admit.  Vitals:   11/11/17 0200 11/11/17 0230 11/11/17 0231 11/11/17 0311  BP:   (!) 149/81 136/62  Pulse: (!) 116 (!) 120 (!) 119 (!) 118  Resp: 19 (!) 21 18 16   Temp:      TempSrc:      SpO2: 97% 98% 100% 99%  Weight:      Height:        Final Clinical Impressions(s) / ED Diagnoses   Final diagnoses:  SIRS (systemic inflammatory response syndrome) [facility])    ED Discharge Orders    None       Antony Madura, PA-C 11/11/17 0405    Shon Baton, MD 11/12/17 0601    Antony Madura, PA-C 12/14/17 0303    Shon Baton, MD 12/15/17 [phone]

## 2017-11-11 NOTE — Progress Notes (Signed)
[facility] Telemetry applied. CCMD notified. Vitals obtained. Pt initially aware that she was in the hospital and why. About 15 minutes after first assessment, pt became forgetful and could not remember where she was. Pt oriented to self at this time. Purewick applied. HR tachy in 120s. Pt denies needs. No family at bedside. Will continue current plan of care.

## 2017-11-11 NOTE — H&P (Signed)
History and Physical    CASH MEADOW ZOX:[id number] DOB: 07-26-1955 DOA: 11/10/2017  PCP: Richardean Chimera, MD   Patient coming from: Home  Chief Complaint: Nausea, vomiting   HPI: Chloe Owens is a 62 y.o. female with medical history significant for history of CVA with cognitive and motor deficits, insulin-dependent diabetes mellitus, depression with anxiety, and hypertension, now presenting to the emergency department for evaluation of nausea with vomiting.  Patient is unable to provide a reliable history due to her chronic cognitive deficits.  Family reports that she has been vomiting since 11/07/2017.  They report that her vomitus has recently appeared brownish and like "coffee grounds."  She was taken to an outside emergency department on 11/09/2017 for evaluation of these planes, had a CT of her abdomen and pelvis that suggested constipation, but without acute findings.  She was reportedly diagnosed with UTI and treated with antibiotic and laxative, but her symptoms have persisted, prompting her presentation here.  ED Course: Upon arrival to the ED, patient is found to be afebrile, saturating adequately on room air, tachycardic, and with stable blood pressure.  EKG features a sinus tachycardia with rate 114.  Chest x-ray is notable for minimal linear density at the left base, likely atelectasis.  Chemistry panel is notable for a creatinine 1.20, consistent with her apparent baseline.  CBC features a leukocytosis to 18,300.  Lactic acid is reassuringly normal, troponin is normal, and fecal occult blood testing is negative.  Urinalysis is notable for moderate hemoglobin, glucosuria, proteinuria, and ketonuria, but not suggestive of infection.  She was given 2 L normal saline in the ED, IV Protonix, and treated with broad-spectrum antibiotics.  Tachycardia has improved, blood pressure remained stable, and there has not been any vomiting noted while in the ED.  She will be observed for ongoing evaluation  and management of SIRS with nausea and vomiting.  Review of Systems:  All other systems reviewed and apart from HPI, are negative.  Past Medical History:  Diagnosis Date  . Anxiety   . Bladder infection    hx of  . Depression   . Diabetes mellitus without complication (HCC)   . GERD (gastroesophageal reflux disease)   . Hyperlipidemia   . Hypertension   . Neuromuscular disorder (HCC)   . Stroke [facility])    05/2008    Past Surgical History:  Procedure Laterality Date  . ANKLE CLOSED REDUCTION  04/15/2012   Procedure: CLOSED REDUCTION ANKLE;  Surgeon: Senaida Lange, MD;  Location: [facility] OR;  Service: Orthopedics;  Laterality: Left;  . CARPAL TUNNEL RELEASE     bilaterally  . ORIF ANKLE FRACTURE  05/09/2012   Procedure: OPEN REDUCTION INTERNAL FIXATION (ORIF) ANKLE FRACTURE;  Surgeon: Toni Arthurs, MD;  Location: [facility] OR;  Service: Orthopedics;  Laterality: Left;  ORIF VERSES EXFIX LEFT ANKLE FRACTURE     reports that she quit smoking about 5 years ago. Her smoking use included cigarettes. She smoked 1.00 pack per day. She has never used smokeless tobacco. She reports that she does not drink alcohol or use drugs.  Allergies  Allergen Reactions  . Codeine Nausea And Vomiting and Rash  . Penicillins Rash    Family History  Problem Relation Age of Onset  . CAD Father   . CAD Mother   . Cancer - Other Mother        Breast  . Cancer - Other Brother        Unknown type  Prior to Admission medications   Medication Sig Start Date End Date Taking? Authorizing Provider  ARIPiprazole (ABILIFY) 30 MG tablet Take 0.5 tablets (15 mg total) by mouth daily. 05/26/16   Love, Evlyn Kanner, PA-C  atorvastatin (LIPITOR) 40 MG tablet Take 1 tablet (40 mg total) by mouth daily at 6 PM. 05/09/16   Richarda Overlie, MD  buPROPion (WELLBUTRIN SR) 150 MG 12 hr tablet Take 150 mg by mouth 2 (two) times daily. 03/27/16   [provider]  clonazePAM (KLONOPIN) 0.5 MG tablet Take 1 tablet (0.5 mg  total) by mouth 2 (two) times daily. 05/09/16   Richarda Overlie, MD  clopidogrel (PLAVIX) 75 MG tablet Take 1 tablet (75 mg total) by mouth daily. 05/26/16   Love, Evlyn Kanner, PA-C  Insulin Glargine (BASAGLAR KWIKPEN) 100 UNIT/ML SOPN Use 25 units with breakfast and 20 units at bedtime. Patient taking differently: Inject 45 Units into the skin. Use 45 units with breakfast.  Is taking Lantus. 05/26/16   Love, Evlyn Kanner, PA-C  JANUVIA 100 MG tablet Take 100 mg by mouth every evening. 05/02/16   [provider]  lisinopril (PRINIVIL,ZESTRIL) 20 MG tablet Take 1 tablet (20 mg total) by mouth daily. 05/26/16   Love, Evlyn Kanner, PA-C  Mirabegron (MYRBETRIQ PO) Take by mouth daily.    [provider]  ondansetron (ZOFRAN-ODT) 8 MG disintegrating tablet TAKE 1 TABLET BY MOUTH FOUR TIMES DAILY AS NEEDED FOR NAUSEA 06/22/16   [provider]  pantoprazole (PROTONIX) 40 MG tablet Take 1 tablet (40 mg total) by mouth 2 (two) times daily before a meal. 05/26/16   Love, Evlyn Kanner, PA-C  polyethylene glycol (MIRALAX / GLYCOLAX) packet Take 17 g by mouth daily. Patient taking differently: Take 17 g by mouth daily as needed.  05/26/16   Love, Evlyn Kanner, PA-C  senna-docusate (SENOKOT-S) 8.6-50 MG tablet Take 2 tablets by mouth at bedtime. 06/20/16   Marcello Fennel, MD  traMADol (ULTRAM) 50 MG tablet Take 0.5-1 tablets (25-50 mg total) by mouth every 12 (twelve) hours as needed for moderate pain (or Headache unrelieved by tylenol). 05/26/16   Jacquelynn Cree, PA-C    Physical Exam: Vitals:   11/11/17 0200 11/11/17 0230 11/11/17 0231 11/11/17 0311  BP:   (!) 149/81 136/62  Pulse: (!) 116 (!) 120 (!) 119 (!) 118  Resp: 19 (!) 21 18 16   Temp:      TempSrc:      SpO2: 97% 98% 100% 99%  Weight:      Height:          Constitutional: NAD, calm  Eyes: PERTLA, lids and conjunctivae normal ENMT: Mucous membranes are moist. Posterior pharynx clear of any exudate or lesions.   Neck: normal,  supple, no masses, no thyromegaly Respiratory: clear to auscultation bilaterally, no wheezing, no crackles. Normal respiratory effort.   Cardiovascular: Rate ~110 and regular. No extremity edema. No significant JVD. Abdomen: No distension, no tenderness, soft. Bowel sounds active.  Musculoskeletal: no clubbing / cyanosis. No joint deformity upper and lower extremities.   Skin: no significant rashes, lesions, ulcers. Warm, dry, well-perfused. Neurologic: No gross facial asymmetry. Sensation to light touch intact.  Psychiatric: Alert and oriented to person and place, not oriented to month or year. Calm, cooperative.     Labs on Admission: I have personally reviewed following labs and imaging studies  CBC: Recent Labs  Lab 11/10/17 2039  WBC 18.3*  NEUTROABS 18.5*  HGB 14.7  HCT 45.8  MCV  85.8  PLT 163   Basic Metabolic Panel: Recent Labs  Lab 11/10/17 2039  NA 136  K 4.1  CL 99*  CO2 22  GLUCOSE 250*  BUN 24*  CREATININE 1.20*  CALCIUM 8.2*   GFR: Estimated Creatinine Clearance: 44.3 mL/min (A) (by C-G formula based on SCr of 1.2 mg/dL (H)). Liver Function Tests: Recent Labs  Lab 11/10/17 2039  AST 23  ALT 22  ALKPHOS 78  BILITOT 1.2  PROT 6.6  ALBUMIN 3.1*   No results for input(s): LIPASE, AMYLASE in the last 168 hours. No results for input(s): AMMONIA in the last 168 hours. Coagulation Profile: No results for input(s): INR, PROTIME in the last 168 hours. Cardiac Enzymes: No results for input(s): CKTOTAL, CKMB, CKMBINDEX, TROPONINI in the last 168 hours. BNP (last 3 results) No results for input(s): PROBNP in the last 8760 hours. HbA1C: No results for input(s): HGBA1C in the last 72 hours. CBG: No results for input(s): GLUCAP in the last 168 hours. Lipid Profile: No results for input(s): CHOL, HDL, LDLCALC, TRIG, CHOLHDL, LDLDIRECT in the last 72 hours. Thyroid Function Tests: No results for input(s): TSH, T4TOTAL, FREET4, T3FREE, THYROIDAB in the last  72 hours. Anemia Panel: No results for input(s): VITAMINB12, FOLATE, FERRITIN, TIBC, IRON, RETICCTPCT in the last 72 hours. Urine analysis:    Component Value Date/Time   COLORURINE YELLOW 11/11/2017 0155   APPEARANCEUR CLEAR 11/11/2017 0155   LABSPEC 1.022 11/11/2017 0155   PHURINE 5.0 11/11/2017 0155   GLUCOSEU >=500 (A) 11/11/2017 0155   HGBUR MODERATE (A) 11/11/2017 0155   BILIRUBINUR NEGATIVE 11/11/2017 0155   KETONESUR 20 (A) 11/11/2017 0155   PROTEINUR 30 (A) 11/11/2017 0155   UROBILINOGEN 1.0 06/03/2014 0440   NITRITE NEGATIVE 11/11/2017 0155   LEUKOCYTESUR NEGATIVE 11/11/2017 0155   Sepsis Labs: @LABRCNTIP (procalcitonin:4,lacticidven:4) )No results found for this or any previous visit (from the past 240 hour(s)).   Radiological Exams on Admission: Dg Chest 2 View  Result Date: 11/11/2017 CLINICAL DATA:  Nausea and vomiting 3 days. Diagnosed with UTI. On antibiotics. EXAM: CHEST - 2 VIEW COMPARISON:  11/10/2017 and 06/09/2017 FINDINGS: Lungs are adequately inflated with minimal linear atelectasis left base. No lobar consolidation or effusion. Cardiomediastinal silhouette is normal. Mild degenerate change of the spine. IMPRESSION: Minimal linear density left base likely atelectasis. Electronically Signed   By: Elberta Fortisaniel  Boyle M.D.   On: 11/11/2017 01:03    EKG: Independently reviewed. Sinus tachycardia (rate 114).   Assessment/Plan   1. SIRS  - Presents for evaluation of nausea with vomiting since 11/07/17  - She was seen at another ED on 6/7 where she had a CT abd/pelvis with possible constipation, was reportedly diagnosed with UTI, and was treated with laxative and antibiotic, but sxs persist  - Patient is afebrile with normal lactate, but has tachycardia and leukocytosis to 18,300  - UA not suggestive of infection, CXR without infiltrate, no respiratory sxs, abd exam benign, no meningismus, and no wounds  - She was started on empiric abx in ED  - Tachycardia and  leukocytosis could be secondary to N/V  - Plan to treat N/V as below, continue abx for now, follow cultures and clinical course   2. Nausea with vomiting  - Abdominal exam in benign  - No vomiting in ED  - Family reports brownish "coffee ground" type emesis; FOBT is negative and H&H have increased   - CT abd/pelvis was performed 11/09/17 with large stool burden but no acute findings  -  Continue prn analgesia, IVF hydration, bowel regimen  - With reports of possible coffee-ground emesis, will hold Plavix, continue her BID PPI, repeat CBC in am   3. Hx of CVA  - She has residual cognitive, motor, and speech deficits  - Continue statin, hold Plavix initially given report of coffee-ground emesis   4. Insulin-dependent DM  - A1c was 8.2% in December 2017  - Managed at home with Lantus 34 units qAM  - Check CBG's and start with Lantus 25 units qAM with Novolog sliding-scale while in hospital    5. CKD stage III  - SCr is 1.20 on admission, similar to priors  - Renally-dose medications, avoid nephrotoxins    6. Depression with anxiety  - Continue Abilify, Wellbutrin, and Klonopin   7. Hypertension  - BP at goal  - Continue lisinopril     DVT prophylaxis: SCD's  Code Status: Full  Family Communication: Discussed with patient Consults called: None Admission status: Observation    Briscoe Deutscher, MD [facility] Pager [phone]  If 7PM-7AM, please contact night-coverage [email or web address] Password [facility]  11/11/2017, 3:33 AM

## 2017-11-11 NOTE — ED Notes (Signed)
Pt pulled out IV. Replaced with another line in R wrist, covered with coban. Pt remains confused

## 2017-11-11 NOTE — ED Notes (Signed)
HH/CARBMOD ORDERED 

## 2017-11-12 DIAGNOSIS — K922 Gastrointestinal hemorrhage, unspecified: Secondary | ICD-10-CM

## 2017-11-12 DIAGNOSIS — R402412 Glasgow coma scale score 13-15, at arrival to emergency department: Secondary | ICD-10-CM | POA: Diagnosis not present

## 2017-11-12 DIAGNOSIS — I10 Essential (primary) hypertension: Secondary | ICD-10-CM | POA: Diagnosis not present

## 2017-11-12 DIAGNOSIS — K5900 Constipation, unspecified: Secondary | ICD-10-CM | POA: Diagnosis not present

## 2017-11-12 DIAGNOSIS — N179 Acute kidney failure, unspecified: Secondary | ICD-10-CM | POA: Diagnosis not present

## 2017-11-12 DIAGNOSIS — E1122 Type 2 diabetes mellitus with diabetic chronic kidney disease: Secondary | ICD-10-CM | POA: Diagnosis not present

## 2017-11-12 DIAGNOSIS — N390 Urinary tract infection, site not specified: Secondary | ICD-10-CM | POA: Diagnosis not present

## 2017-11-12 DIAGNOSIS — K226 Gastro-esophageal laceration-hemorrhage syndrome: Secondary | ICD-10-CM | POA: Diagnosis not present

## 2017-11-12 DIAGNOSIS — F418 Other specified anxiety disorders: Secondary | ICD-10-CM | POA: Diagnosis not present

## 2017-11-12 DIAGNOSIS — Z7902 Long term (current) use of antithrombotics/antiplatelets: Secondary | ICD-10-CM | POA: Diagnosis not present

## 2017-11-12 DIAGNOSIS — Z79899 Other long term (current) drug therapy: Secondary | ICD-10-CM | POA: Diagnosis not present

## 2017-11-12 DIAGNOSIS — R6510 Systemic inflammatory response syndrome (SIRS) of non-infectious origin without acute organ dysfunction: Secondary | ICD-10-CM | POA: Diagnosis not present

## 2017-11-12 DIAGNOSIS — Z794 Long term (current) use of insulin: Secondary | ICD-10-CM | POA: Diagnosis not present

## 2017-11-12 DIAGNOSIS — Z7982 Long term (current) use of aspirin: Secondary | ICD-10-CM

## 2017-11-12 DIAGNOSIS — K219 Gastro-esophageal reflux disease without esophagitis: Secondary | ICD-10-CM | POA: Diagnosis not present

## 2017-11-12 DIAGNOSIS — K920 Hematemesis: Secondary | ICD-10-CM | POA: Diagnosis not present

## 2017-11-12 DIAGNOSIS — E785 Hyperlipidemia, unspecified: Secondary | ICD-10-CM | POA: Diagnosis not present

## 2017-11-12 DIAGNOSIS — Z7901 Long term (current) use of anticoagulants: Secondary | ICD-10-CM | POA: Diagnosis not present

## 2017-11-12 DIAGNOSIS — I129 Hypertensive chronic kidney disease with stage 1 through stage 4 chronic kidney disease, or unspecified chronic kidney disease: Secondary | ICD-10-CM | POA: Diagnosis not present

## 2017-11-12 DIAGNOSIS — E119 Type 2 diabetes mellitus without complications: Secondary | ICD-10-CM | POA: Diagnosis not present

## 2017-11-12 DIAGNOSIS — N183 Chronic kidney disease, stage 3 (moderate): Secondary | ICD-10-CM | POA: Diagnosis not present

## 2017-11-12 DIAGNOSIS — Z87891 Personal history of nicotine dependence: Secondary | ICD-10-CM | POA: Diagnosis not present

## 2017-11-12 DIAGNOSIS — R112 Nausea with vomiting, unspecified: Secondary | ICD-10-CM | POA: Diagnosis present

## 2017-11-12 DIAGNOSIS — I69354 Hemiplegia and hemiparesis following cerebral infarction affecting left non-dominant side: Secondary | ICD-10-CM | POA: Diagnosis not present

## 2017-11-12 LAB — BASIC METABOLIC PANEL
Anion gap: 8 (ref 5–15)
BUN: 14 mg/dL (ref 6–20)
CO2: 25 mmol/L (ref 22–32)
Calcium: 8.1 mg/dL — ABNORMAL LOW (ref 8.9–10.3)
Chloride: 106 mmol/L (ref 101–111)
Creatinine, Ser: 0.95 mg/dL (ref 0.44–1.00)
GFR calc Af Amer: >60 mL/min (ref >60)
GFR calc non Af Amer: >60 mL/min (ref >60)
Glucose, Bld: 192 mg/dL — ABNORMAL HIGH (ref 65–99)
Potassium: 3.5 mmol/L (ref 3.5–5.1)
Sodium: 139 mmol/L (ref 135–145)

## 2017-11-12 LAB — GLUCOSE, CAPILLARY
Glucose-Capillary: 189 mg/dL — ABNORMAL HIGH (ref 65–99)
Glucose-Capillary: 180 mg/dL — ABNORMAL HIGH (ref 65–99)
Glucose-Capillary: 162 mg/dL — ABNORMAL HIGH (ref 65–99)
Glucose-Capillary: 119 mg/dL — ABNORMAL HIGH (ref 65–99)

## 2017-11-12 LAB — CBC
HCT: 28.2 % — ABNORMAL LOW (ref 36.0–46.0)
Hemoglobin: 9.2 g/dL — ABNORMAL LOW (ref 12.0–15.0)
MCH: 27.5 pg (ref 26.0–34.0)
MCHC: 32.6 g/dL (ref 30.0–36.0)
MCV: 84.4 fL (ref 78.0–100.0)
Platelets: 229 10*3/uL (ref 150–400)
RBC: 3.34 MIL/uL — ABNORMAL LOW (ref 3.87–5.11)
RDW: 13.1 % (ref 11.5–15.5)
WBC: 15.4 10*3/uL — ABNORMAL HIGH (ref 4.0–10.5)

## 2017-11-12 LAB — HEMOGLOBIN A1C
Hgb A1c MFr Bld: 9.8 % — ABNORMAL HIGH (ref 4.8–5.6)
Mean Plasma Glucose: 234.56 mg/dL

## 2017-11-12 LAB — URINE CULTURE: Culture: NO GROWTH

## 2017-11-12 MED ORDER — INSULIN ASPART 100 UNIT/ML ~~LOC~~ SOLN
5.0000 [IU] | Freq: Three times a day (TID) | SUBCUTANEOUS | Status: DC
  Administered 2017-11-12 – 2017-11-14 (×3): 5 [IU] via SUBCUTANEOUS

## 2017-11-12 MED ORDER — INSULIN GLARGINE 100 UNIT/ML ~~LOC~~ SOLN
25.0000 [IU] | Freq: Every day | SUBCUTANEOUS | Status: DC
  Administered 2017-11-13 – 2017-11-14 (×2): 25 [IU] via SUBCUTANEOUS
  Filled 2017-11-12 (×2): qty 0.25

## 2017-11-12 MED ORDER — LEVOFLOXACIN IN D5W 750 MG/150ML IV SOLN
750.0000 mg | INTRAVENOUS | Status: DC
  Administered 2017-11-12 – 2017-11-14 (×2): 750 mg via INTRAVENOUS
  Filled 2017-11-12 (×4): qty 150

## 2017-11-12 MED ORDER — INSULIN GLARGINE 100 UNIT/ML ~~LOC~~ SOLN
15.0000 [IU] | Freq: Every day | SUBCUTANEOUS | Status: DC
  Administered 2017-11-13 (×2): 15 [IU] via SUBCUTANEOUS
  Filled 2017-11-12 (×4): qty 0.15

## 2017-11-12 NOTE — Progress Notes (Signed)
Progress Note   Subjective  Patient has not had any further episodes of vomiting / hematemesis. She is tolerating liquid diet. She states she is feeling a bit better. On antibiotics for UTI / SIRS. HR remains around 100 or so. No pain   Objective   Vital signs in last 24 hours: Temp:  [98.2 F (36.8 C)-98.8 F (37.1 C)] 98.3 F (36.8 C) (06/10 0818) Pulse Rate:  [95-120] 102 (06/10 0526) Resp:  [18-25] 21 (06/10 0900) BP: (124-146)/(63-81) 134/74 (06/10 0900) SpO2:  [91 %-94 %] 91 % (06/10 0526) Last BM Date: 11/11/17 General:    white female in NAD Heart:  Regular, mildly tachycardic; no murmurs Lungs: Respirations even and unlabored, lungs CTA bilaterally Abdomen:  Soft, nontender and nondistended.  Extremities:  Without edema. Neurologic:  Alert and oriented,  grossly normal neurologically. Psych:  Cooperative. Normal mood and affect.  Intake/Output from previous day: 06/09 0701 - 06/10 0700 In: 400 [P.O.:300; IV Piggyback:100] Out: 1150 [Urine:1150] Intake/Output this shift: No intake/output data recorded.  Lab Results: Recent Labs    11/10/17 2039 11/11/17 0457 11/11/17 1314 11/11/17 2112 11/12/17 0342  WBC 18.3* 22.0*  --   --  15.4*  HGB 14.7 9.8* 9.6* 9.4* 9.2*  HCT 45.8 30.6* 29.7* 28.9* 28.2*  PLT 163 238  --   --  229   BMET Recent Labs    11/10/17 2039 11/11/17 0457 11/12/17 0342  NA 136 138 139  K 4.1 4.0 3.5  CL 99* 104 106  CO2 22 22 25   GLUCOSE 250* 339* 192*  BUN 24* 27* 14  CREATININE 1.20* 1.24* 0.95  CALCIUM 8.2* 7.6* 8.1*   LFT Recent Labs    11/10/17 2039  PROT 6.6  ALBUMIN 3.1*  AST 23  ALT 22  ALKPHOS 78  BILITOT 1.2   PT/INR No results for input(s): LABPROT, INR in the last 72 hours.  Studies/Results: Dg Chest 2 View  Result Date: 11/11/2017 CLINICAL DATA:  Nausea and vomiting 3 days. Diagnosed with UTI. On antibiotics. EXAM: CHEST - 2 VIEW COMPARISON:  11/10/2017 and 06/09/2017 FINDINGS: Lungs are  adequately inflated with minimal linear atelectasis left base. No lobar consolidation or effusion. Cardiomediastinal silhouette is normal. Mild degenerate change of the spine. IMPRESSION: Minimal linear density left base likely atelectasis. Electronically Signed   By: Elberta Fortisaniel  Boyle M.D.   On: 11/11/2017 01:03       Assessment / Plan:   62 y/o female with history of CVA, poorly controlled DM, presenting with symptoms of hematemesis / UGI bleed in the setting of aspirin, plavix and NSAID use, high suspicion for PUD. Also with UTI / SIRS on presentation. Improved on antibiotics, with downtrend in WBC today. No further symptoms of bleeding. Hgb relatively stable. HR remains around 100. She was also constipated on admission and now on a bowel regimen.   She warrants an endoscopy to further evaluate her symptoms at some point in time during this admission. Given her bleeding seems to have resolved, I think delaying this another day and doing it tomorrow and allow her another day to recover from her infection if she's otherwise stable is reasonable. I have discussed risks / benefits of EGD and anesthesia with her, and she wanted to proceed. Continue liquid diet until that time, NPO after midnight. Continue to hold plavix for now, continue IV protonix and antibiotics. We will reassess her in the morning to ensure she is stable for endoscopy. Call in the interim  with questions.   Ileene Patrick, MD [facility]

## 2017-11-12 NOTE — Progress Notes (Signed)
PHARMACY NOTE:  ANTIMICROBIAL RENAL DOSAGE ADJUSTMENT  Current antimicrobial regimen includes a mismatch between antimicrobial dosage and estimated renal function.  As per policy approved by the Pharmacy & Therapeutics and Medical Executive Committees, the antimicrobial dosage will be adjusted accordingly.  Current antimicrobial dosage:  Levofloxacin 750mg  IV q48h  Indication: sepsis  Renal Function:  Estimated Creatinine Clearance: 66.4 mL/min (by C-G formula based on SCr of 0.95 mg/dL).    Antimicrobial dosage has been changed to:  Levofloxacin 750mg  IV q24h  Babs BertinHaley Michon Kaczmarek, PharmD, BCPS Clinical Pharmacist 11/12/2017 9:13 AM

## 2017-11-12 NOTE — Progress Notes (Signed)
[facility]                                                                              Patient Demographics  Chloe Owens, is a 62 y.o. female, DOB - 15-Dec-1955, ZOX:[id number]  Admit date - 11/10/2017   Admitting Physician Briscoe Deutscher, MD  Outpatient Primary MD for the patient is Richardean Chimera, MD  Outpatient specialists:   LOS - 0  days   Medical records reviewed and are as summarized below:    Chief Complaint  Patient presents with  . Nausea       Brief summary   Patient is a 62 year old female with history of CVA, cognitive and motor deficits, IDDM, anxiety, depression, hypertension presented with nausea and vomiting.  Per family has been vomiting since 6/5, recently appeared brownish and like coffee grounds.  CT of the abdomen in the outside ED, suggested constipation but no acute findings.  She was diagnosed with UTI and started on antibiotics and laxative. FOBT negative, CBC showed a leukocytosis, patient was placed on IV fluids, IV Protonix and broad-spectrum antibiotics.  Admitted for sirs.    Assessment & Plan    Principal Problem:   SIRS (systemic inflammatory response syndrome) (HCC) -Presented with nausea and vomiting, CT abdomen pelvis at [facility] ED showed possible constipation otherwise no acute pathology -Leukocytosis improving, urine culture showed no growth.  Blood cultures negative so far.  -  Continue IV vancomycin and aztreonam, source unclear possibly CAP/aspiration pneumonia, chest x-ray showed minimal linear density left base  Active Problems: Nausea and vomiting, ?  Coffee-ground emesis with abdominal pain -Currently no nausea, vomiting, abdominal pain, possibly coffee-ground emesis from Mallory-Weiss tear - FOBT negative, hemoglobin slightly trending down this morning. - GI following, plan for EGD on 6/11 -Continue to hold Plavix   Diabetes mellitus, insulin-dependent, uncontrolled with hyperglycemia, type II -CBG  still uncontrolled, will continue sliding scale insulin -Increase meal coverage to 5 units 3 times daily AC, continue Lantus 25 units a.m., 15 units at bedtime.  No changes in the basal insulin as patient will be n.p.o. after midnight for EGD   History of CVA -Has residual cognitive and motor deficits. -Continue statin.   - Currently Plavix on hold due to coffee-ground emesis and EGD planned on 6/11  Hypertension with sinus tachycardia -Hold lisinopril, placed on metoprolol 12.5 mg twice a day  CKD stage III Creatinine 1.2 on admission, at baseline Holding lisinopril, creatinine has normalized  Depression with anxiety Continue Abilify, Wellbutrin, Klonopin  Code Status: Full CODE STATUS DVT Prophylaxis:   SCD's Family Communication: Discussed in detail with the patient, all imaging results, lab results explained to the patient    Disposition Plan: Not medically ready  Time Spent in minutes 25 minutes  Procedures:  None  Consultants:   GI  Antimicrobials:   IV vancomycin 6/9  IV aztreonam 6/9  IV Levaquin 6/9   Medications  Scheduled Meds: . ARIPiprazole  15 mg Oral Daily  . atorvastatin  40 mg Oral q1800  . bisacodyl  10 mg Rectal Once  . buPROPion  150 mg Oral BID WC  .  clonazePAM  0.5 mg Oral BID  . insulin aspart  0-5 Units Subcutaneous QHS  . insulin aspart  0-9 Units Subcutaneous TID WC  . insulin aspart  3 Units Subcutaneous TID WC  . insulin glargine  15 Units Subcutaneous QHS  . insulin glargine  25 Units Subcutaneous Daily  . metoprolol tartrate  12.5 mg Oral BID  . pantoprazole  40 mg Intravenous Q12H  . senna  1 tablet Oral BID  . sodium chloride flush  3 mL Intravenous Q12H   Continuous Infusions: . aztreonam Stopped (11/11/17 2233)  . levofloxacin (LEVAQUIN) IV 750 mg (11/12/17 1007)  . vancomycin 750 mg (11/12/17 0556)   PRN Meds:.acetaminophen **OR** acetaminophen, bisacodyl, ondansetron **OR** ondansetron (ZOFRAN) IV, polyethylene  glycol, sodium phosphate, traMADol   Antibiotics   Anti-infectives (From admission, onward)   Start     Dose/Rate Route Frequency Ordered Stop   11/12/17 2200  levofloxacin (LEVAQUIN) IVPB 750 mg  Status:  Discontinued     750 mg 100 mL/hr over 90 Minutes Intravenous Every 48 hours 11/11/17 0359 11/12/17 0911   11/12/17 0915  levofloxacin (LEVAQUIN) IVPB 750 mg     750 mg 100 mL/hr over 90 Minutes Intravenous Every 24 hours 11/12/17 0911     11/11/17 1800  vancomycin (VANCOCIN) IVPB 750 mg/150 ml premix     750 mg 150 mL/hr over 60 Minutes Intravenous Every 12 hours 11/11/17 0359     11/11/17 1000  aztreonam (AZACTAM) 1 g in sodium chloride 0.9 % 100 mL IVPB     1 g 200 mL/hr over 30 Minutes Intravenous Every 8 hours 11/11/17 0359     11/11/17 0130  levofloxacin (LEVAQUIN) IVPB 750 mg     750 mg 100 mL/hr over 90 Minutes Intravenous  Once 11/11/17 0127 11/12/17 0824   11/11/17 0130  aztreonam (AZACTAM) 2 g in sodium chloride 0.9 % 100 mL IVPB     2 g 200 mL/hr over 30 Minutes Intravenous  Once 11/11/17 0127 11/11/17 0406   11/11/17 0130  vancomycin (VANCOCIN) IVPB 1000 mg/200 mL premix     1,000 mg 200 mL/hr over 60 Minutes Intravenous  Once 11/11/17 0127 11/11/17 0506        Subjective:   Chloe Owens was seen and examined today.  No nausea vomiting or abdominal pain this morning.  No fevers or chills.  No chest pain or shortness of breath  Objective:   Vitals:   11/12/17 0002 11/12/17 0526 11/12/17 0818 11/12/17 0900  BP: 132/73 126/69 135/63 134/74  Pulse: 95 (!) 102    Resp: 20 (!) 25 19 (!) 21  Temp: 98.2 F (36.8 C) 98.8 F (37.1 C) 98.3 F (36.8 C)   TempSrc: Oral Oral Oral   SpO2: 91% 91%    Weight:      Height:        Intake/Output Summary (Last 24 hours) at 11/12/2017 1019 Last data filed at 11/12/2017 0555 Gross per 24 hour  Intake 400 ml  Output 1150 ml  Net -750 ml     Wt Readings from Last 3 Encounters:  11/11/17 86.9 kg (191 lb 9.3 oz)    10/25/16 72.1 kg (159 lb)  05/24/16 75.7 kg (166 lb 12.8 oz)     Exam   General: Alert and oriented, NAD  Eyes:  HEENT:   Cardiovascular: S1 S2clear. Regular rate and rhythm. No pedal edema b/l  Respiratory: CTAB  Gastrointestinal: Soft, nontender, nondistended, + bowel sounds  Ext: no pedal edema  bilaterally  Neuro: no new deficit  Musculoskeletal: No digital cyanosis, clubbing  Skin: No rashes  Psych: normal mood and affect   Data Reviewed:  I have personally reviewed following labs and imaging studies  Micro Results Recent Results (from the past 240 hour(s))  Urine culture     Status: None   Collection Time: 11/11/17  1:55 AM  Result Value Ref Range Status   Specimen Description URINE, RANDOM  Final   Special Requests NONE  Final   Culture   Final    NO GROWTH Performed at [facility], [address], [place], [place] [address]    Report Status 11/12/2017 FINAL  Final    Radiology Reports Dg Chest 2 View  Result Date: 11/11/2017 CLINICAL DATA:  Nausea and vomiting 3 days. Diagnosed with UTI. On antibiotics. EXAM: CHEST - 2 VIEW COMPARISON:  11/10/2017 and 06/09/2017 FINDINGS: Lungs are adequately inflated with minimal linear atelectasis left base. No lobar consolidation or effusion. Cardiomediastinal silhouette is normal. Mild degenerate change of the spine. IMPRESSION: Minimal linear density left base likely atelectasis. Electronically Signed   By: Elberta Fortis M.D.   On: 11/11/2017 01:03    Lab Data:  CBC: Recent Labs  Lab 11/10/17 2039 11/11/17 0457 11/11/17 1314 11/11/17 2112 11/12/17 0342  WBC 18.3* 22.0*  --   --  15.4*  NEUTROABS 18.5*  --   --   --   --   HGB 14.7 9.8* 9.6* 9.4* 9.2*  HCT 45.8 30.6* 29.7* 28.9* 28.2*  MCV 85.8 87.9  --   --  84.4  PLT 163 238  --   --  229   Basic Metabolic Panel: Recent Labs  Lab 11/10/17 2039 11/11/17 0457 11/12/17 0342  NA 136 138 139  K 4.1 4.0 3.5  CL 99* 104 106  CO2 22 22 25    GLUCOSE 250* 339* 192*  BUN 24* 27* 14  CREATININE 1.20* 1.24* 0.95  CALCIUM 8.2* 7.6* 8.1*   GFR: Estimated Creatinine Clearance: 66.4 mL/min (by C-G formula based on SCr of 0.95 mg/dL). Liver Function Tests: Recent Labs  Lab 11/10/17 2039  AST 23  ALT 22  ALKPHOS 78  BILITOT 1.2  PROT 6.6  ALBUMIN 3.1*   No results for input(s): LIPASE, AMYLASE in the last 168 hours. No results for input(s): AMMONIA in the last 168 hours. Coagulation Profile: No results for input(s): INR, PROTIME in the last 168 hours. Cardiac Enzymes: No results for input(s): CKTOTAL, CKMB, CKMBINDEX, TROPONINI in the last 168 hours. BNP (last 3 results) No results for input(s): PROBNP in the last 8760 hours. HbA1C: Recent Labs    11/12/17 0342  HGBA1C 9.8*   CBG: Recent Labs  Lab 11/11/17 0825 11/11/17 1128 11/11/17 1713 11/11/17 2131 11/12/17 0602  GLUCAP 308* 252* 322* 251* 189*   Lipid Profile: No results for input(s): CHOL, HDL, LDLCALC, TRIG, CHOLHDL, LDLDIRECT in the last 72 hours. Thyroid Function Tests: No results for input(s): TSH, T4TOTAL, FREET4, T3FREE, THYROIDAB in the last 72 hours. Anemia Panel: No results for input(s): VITAMINB12, FOLATE, FERRITIN, TIBC, IRON, RETICCTPCT in the last 72 hours. Urine analysis:    Component Value Date/Time   COLORURINE YELLOW 11/11/2017 0155   APPEARANCEUR CLEAR 11/11/2017 0155   LABSPEC 1.022 11/11/2017 0155   PHURINE 5.0 11/11/2017 0155   GLUCOSEU >=500 (A) 11/11/2017 0155   HGBUR MODERATE (A) 11/11/2017 0155   BILIRUBINUR NEGATIVE 11/11/2017 0155   KETONESUR 20 (A) 11/11/2017 0155   PROTEINUR 30 (  A) 11/11/2017 0155   UROBILINOGEN 1.0 06/03/2014 0440   NITRITE NEGATIVE 11/11/2017 0155   LEUKOCYTESUR NEGATIVE 11/11/2017 0155     Jeanie Mccard M.D. [facility] 11/12/2017, 10:19 AM  Pager: [phone] Between 7am to 7pm - call Pager - [phone]  After 7pm go to [email or web address] - password [email or web address]  Call night coverage person  covering after 7pm

## 2017-11-12 NOTE — Progress Notes (Signed)
Inpatient Diabetes Program Recommendations  AACE/ADA: New Consensus Statement on Inpatient Glycemic Control (2015)  Target Ranges:  Prepandial:   less than 140 mg/dL      Peak postprandial:   less than 180 mg/dL (1-2 hours)      Critically ill patients:  140 - 180 mg/dL   Lab Results  Component Value Date   GLUCAP 119 (H) 11/12/2017   HGBA1C 9.8 (H) 11/12/2017    Review of Glycemic Control  Diabetes history: DM2 Outpatient Diabetes medications: Lantus 45 units QD, previously on Januvia 100 mg QD and Trulicity  Current orders for Inpatient glycemic control: Lantus 25 units QD, 15 units QHS, Novolog 0-9 units tidwc and HS  Inpatient Diabetes Program Recommendations:     Agree with orders.  Long discussion with pt and pt's husband regarding diabetes meds. Husband states pt had been taken off Januvia and put on Trulicity by PCP. Did not like Trulicity and wants to go back on Januvia.  Discussed mealtime insulin as another option for controlling blood sugars. May consider being discharged on meal coverage insulin.  Blood sugars look great inpatient.  Recommendations:  For discharge: Lantus 45 units QD Novolog 0-15 units tidwc. Januvia 100 mg QD  Pt's husband willing to give sliding scale insulin.  Thank you. Ailene Ardshonda Allegra Cerniglia, RD, LDN, CDE Inpatient Diabetes Coordinator [phone]

## 2017-11-12 NOTE — H&P (View-Only) (Signed)
Progress Note   Subjective  Patient has not had any further episodes of vomiting / hematemesis. She is tolerating liquid diet. She states she is feeling a bit better. On antibiotics for UTI / SIRS. HR remains around 100 or so. No pain   Objective   Vital signs in last 24 hours: Temp:  [98.2 F (36.8 C)-98.8 F (37.1 C)] 98.3 F (36.8 C) (06/10 0818) Pulse Rate:  [95-120] 102 (06/10 0526) Resp:  [18-25] 21 (06/10 0900) BP: (124-146)/(63-81) 134/74 (06/10 0900) SpO2:  [91 %-94 %] 91 % (06/10 0526) Last BM Date: 11/11/17 General:    white female in NAD Heart:  Regular, mildly tachycardic; no murmurs Lungs: Respirations even and unlabored, lungs CTA bilaterally Abdomen:  Soft, nontender and nondistended.  Extremities:  Without edema. Neurologic:  Alert and oriented,  grossly normal neurologically. Psych:  Cooperative. Normal mood and affect.  Intake/Output from previous day: 06/09 0701 - 06/10 0700 In: 400 [P.O.:300; IV Piggyback:100] Out: 1150 [Urine:1150] Intake/Output this shift: No intake/output data recorded.  Lab Results: Recent Labs    11/10/17 2039 11/11/17 0457 11/11/17 1314 11/11/17 2112 11/12/17 0342  WBC 18.3* 22.0*  --   --  15.4*  HGB 14.7 9.8* 9.6* 9.4* 9.2*  HCT 45.8 30.6* 29.7* 28.9* 28.2*  PLT 163 238  --   --  229   BMET Recent Labs    11/10/17 2039 11/11/17 0457 11/12/17 0342  NA 136 138 139  K 4.1 4.0 3.5  CL 99* 104 106  CO2 22 22 25   GLUCOSE 250* 339* 192*  BUN 24* 27* 14  CREATININE 1.20* 1.24* 0.95  CALCIUM 8.2* 7.6* 8.1*   LFT Recent Labs    11/10/17 2039  PROT 6.6  ALBUMIN 3.1*  AST 23  ALT 22  ALKPHOS 78  BILITOT 1.2   PT/INR No results for input(s): LABPROT, INR in the last 72 hours.  Studies/Results: Dg Chest 2 View  Result Date: 11/11/2017 CLINICAL DATA:  Nausea and vomiting 3 days. Diagnosed with UTI. On antibiotics. EXAM: CHEST - 2 VIEW COMPARISON:  11/10/2017 and 06/09/2017 FINDINGS: Lungs are  adequately inflated with minimal linear atelectasis left base. No lobar consolidation or effusion. Cardiomediastinal silhouette is normal. Mild degenerate change of the spine. IMPRESSION: Minimal linear density left base likely atelectasis. Electronically Signed   By: Elberta Fortisaniel  Boyle M.D.   On: 11/11/2017 01:03       Assessment / Plan:   62 y/o female with history of CVA, poorly controlled DM, presenting with symptoms of hematemesis / UGI bleed in the setting of aspirin, plavix and NSAID use, high suspicion for PUD. Also with UTI / SIRS on presentation. Improved on antibiotics, with downtrend in WBC today. No further symptoms of bleeding. Hgb relatively stable. HR remains around 100. She was also constipated on admission and now on a bowel regimen.   She warrants an endoscopy to further evaluate her symptoms at some point in time during this admission. Given her bleeding seems to have resolved, I think delaying this another day and doing it tomorrow and allow her another day to recover from her infection if she's otherwise stable is reasonable. I have discussed risks / benefits of EGD and anesthesia with her, and she wanted to proceed. Continue liquid diet until that time, NPO after midnight. Continue to hold plavix for now, continue IV protonix and antibiotics. We will reassess her in the morning to ensure she is stable for endoscopy. Call in the interim  with questions.   Ileene Patrick, MD [facility]

## 2017-11-13 ENCOUNTER — Encounter (HOSPITAL_COMMUNITY): Payer: Self-pay | Admitting: Anesthesiology

## 2017-11-13 ENCOUNTER — Encounter (HOSPITAL_COMMUNITY)
Admission: EM | Disposition: A | Discharge status: Home Health | Payer: Self-pay | Source: Home / Self Care | Attending: Internal Medicine

## 2017-11-13 ENCOUNTER — Inpatient Hospital Stay (HOSPITAL_COMMUNITY): Payer: Medicare Other | Admitting: Anesthesiology

## 2017-11-13 DIAGNOSIS — K2970 Gastritis, unspecified, without bleeding: Secondary | ICD-10-CM | POA: Diagnosis not present

## 2017-11-13 DIAGNOSIS — I10 Essential (primary) hypertension: Secondary | ICD-10-CM | POA: Diagnosis not present

## 2017-11-13 DIAGNOSIS — N183 Chronic kidney disease, stage 3 (moderate): Secondary | ICD-10-CM | POA: Diagnosis not present

## 2017-11-13 DIAGNOSIS — K449 Diaphragmatic hernia without obstruction or gangrene: Secondary | ICD-10-CM

## 2017-11-13 DIAGNOSIS — N390 Urinary tract infection, site not specified: Secondary | ICD-10-CM | POA: Diagnosis not present

## 2017-11-13 DIAGNOSIS — K317 Polyp of stomach and duodenum: Secondary | ICD-10-CM | POA: Diagnosis not present

## 2017-11-13 DIAGNOSIS — K226 Gastro-esophageal laceration-hemorrhage syndrome: Secondary | ICD-10-CM | POA: Diagnosis not present

## 2017-11-13 DIAGNOSIS — K2980 Duodenitis without bleeding: Secondary | ICD-10-CM

## 2017-11-13 DIAGNOSIS — K920 Hematemesis: Secondary | ICD-10-CM

## 2017-11-13 DIAGNOSIS — R6510 Systemic inflammatory response syndrome (SIRS) of non-infectious origin without acute organ dysfunction: Secondary | ICD-10-CM | POA: Diagnosis not present

## 2017-11-13 DIAGNOSIS — R112 Nausea with vomiting, unspecified: Secondary | ICD-10-CM | POA: Diagnosis not present

## 2017-11-13 DIAGNOSIS — N179 Acute kidney failure, unspecified: Secondary | ICD-10-CM | POA: Diagnosis not present

## 2017-11-13 DIAGNOSIS — E119 Type 2 diabetes mellitus without complications: Secondary | ICD-10-CM | POA: Diagnosis not present

## 2017-11-13 DIAGNOSIS — I69354 Hemiplegia and hemiparesis following cerebral infarction affecting left non-dominant side: Secondary | ICD-10-CM | POA: Diagnosis not present

## 2017-11-13 HISTORY — PX: BIOPSY: SHX5522

## 2017-11-13 HISTORY — PX: ESOPHAGOGASTRODUODENOSCOPY (EGD) WITH PROPOFOL: SHX5813

## 2017-11-13 LAB — BASIC METABOLIC PANEL
Anion gap: 5 (ref 5–15)
BUN: 10 mg/dL (ref 6–20)
CO2: 27 mmol/L (ref 22–32)
Calcium: 8.1 mg/dL — ABNORMAL LOW (ref 8.9–10.3)
Chloride: 107 mmol/L (ref 101–111)
Creatinine, Ser: 1.01 mg/dL — ABNORMAL HIGH (ref 0.44–1.00)
GFR calc Af Amer: >60 mL/min (ref >60)
GFR calc non Af Amer: 58 mL/min — ABNORMAL LOW (ref >60)
Glucose, Bld: 129 mg/dL — ABNORMAL HIGH (ref 65–99)
Potassium: 3.2 mmol/L — ABNORMAL LOW (ref 3.5–5.1)
Sodium: 139 mmol/L (ref 135–145)

## 2017-11-13 LAB — CBC
HCT: 27.4 % — ABNORMAL LOW (ref 36.0–46.0)
Hemoglobin: 8.9 g/dL — ABNORMAL LOW (ref 12.0–15.0)
MCH: 27.3 pg (ref 26.0–34.0)
MCHC: 32.5 g/dL (ref 30.0–36.0)
MCV: 84 fL (ref 78.0–100.0)
Platelets: 228 10*3/uL (ref 150–400)
RBC: 3.26 MIL/uL — ABNORMAL LOW (ref 3.87–5.11)
RDW: 13 % (ref 11.5–15.5)
WBC: 14.5 10*3/uL — ABNORMAL HIGH (ref 4.0–10.5)

## 2017-11-13 LAB — GLUCOSE, CAPILLARY
Glucose-Capillary: 95 mg/dL (ref 65–99)
Glucose-Capillary: 111 mg/dL — ABNORMAL HIGH (ref 65–99)
Glucose-Capillary: 142 mg/dL — ABNORMAL HIGH (ref 65–99)
Glucose-Capillary: 241 mg/dL — ABNORMAL HIGH (ref 65–99)

## 2017-11-13 SURGERY — ESOPHAGOGASTRODUODENOSCOPY (EGD) WITH PROPOFOL
Anesthesia: Monitor Anesthesia Care

## 2017-11-13 MED ORDER — LORAZEPAM 2 MG/ML IJ SOLN
0.5000 mg | Freq: Once | INTRAMUSCULAR | Status: AC
  Administered 2017-11-13: 0.5 mg via INTRAVENOUS
  Filled 2017-11-13: qty 1

## 2017-11-13 MED ORDER — LORAZEPAM 2 MG/ML IJ SOLN
1.0000 mg | Freq: Four times a day (QID) | INTRAMUSCULAR | Status: DC
  Filled 2017-11-13: qty 1

## 2017-11-13 MED ORDER — PROPOFOL 500 MG/50ML IV EMUL
INTRAVENOUS | Status: DC | PRN
  Administered 2017-11-13: 100 ug/kg/min via INTRAVENOUS

## 2017-11-13 MED ORDER — POTASSIUM CHLORIDE 10 MEQ/100ML IV SOLN
10.0000 meq | INTRAVENOUS | Status: AC
  Administered 2017-11-13 (×2): 10 meq via INTRAVENOUS
  Filled 2017-11-13 (×2): qty 100

## 2017-11-13 MED ORDER — CLOPIDOGREL BISULFATE 75 MG PO TABS
75.0000 mg | ORAL_TABLET | Freq: Every day | ORAL | Status: DC
  Administered 2017-11-14: 75 mg via ORAL
  Filled 2017-11-13: qty 1

## 2017-11-13 MED ORDER — PANTOPRAZOLE SODIUM 40 MG PO TBEC
40.0000 mg | DELAYED_RELEASE_TABLET | Freq: Every day | ORAL | Status: DC
  Administered 2017-11-14: 40 mg via ORAL
  Filled 2017-11-13: qty 1

## 2017-11-13 MED ORDER — POTASSIUM CHLORIDE CRYS ER 20 MEQ PO TBCR
40.0000 meq | EXTENDED_RELEASE_TABLET | Freq: Once | ORAL | Status: AC
  Administered 2017-11-13: 40 meq via ORAL
  Filled 2017-11-13: qty 2

## 2017-11-13 MED ORDER — LACTATED RINGERS IV SOLN
INTRAVENOUS | Status: DC | PRN
  Administered 2017-11-13: 09:00:00 via INTRAVENOUS

## 2017-11-13 MED ORDER — CLONAZEPAM 0.5 MG PO TABS
0.5000 mg | ORAL_TABLET | Freq: Once | ORAL | Status: AC
  Administered 2017-11-13: 0.5 mg via ORAL
  Filled 2017-11-13: qty 1

## 2017-11-13 MED ORDER — LIDOCAINE 2% (20 MG/ML) 5 ML SYRINGE
INTRAMUSCULAR | Status: DC | PRN
  Administered 2017-11-13: 40 mg via INTRAVENOUS

## 2017-11-13 SURGICAL SUPPLY — 15 items

## 2017-11-13 NOTE — Progress Notes (Signed)
Pt continued to be confused/paranoid/delusional after husband returned. MD made aware. New orders given. Will continue to monitor closely.

## 2017-11-13 NOTE — Anesthesia Procedure Notes (Signed)
Procedure Name: MAC Date/Time: 11/13/2017 9:40 AM Performed by: Kyung Rudd, CRNA Pre-anesthesia Checklist: Patient identified, Emergency Drugs available, Suction available and Patient being monitored Patient Re-evaluated:Patient Re-evaluated prior to induction Oxygen Delivery Method: Nasal cannula Induction Type: IV induction Placement Confirmation: positive ETCO2 Dental Injury: Teeth and Oropharynx as per pre-operative assessment

## 2017-11-13 NOTE — Plan of Care (Signed)
  Problem: Clinical Measurements: Goal: Diagnostic test results will improve Outcome: Progressing   

## 2017-11-13 NOTE — Anesthesia Postprocedure Evaluation (Signed)
Anesthesia Post Note  [name]  Procedure(s) Performed: ESOPHAGOGASTRODUODENOSCOPY (EGD) WITH PROPOFOL (N/A ) BIOPSY     Patient location during evaluation: Endoscopy Anesthesia Type: MAC Level of consciousness: awake Pain management: pain level controlled Vital Signs Assessment: post-procedure vital signs reviewed and stable Respiratory status: spontaneous breathing Cardiovascular status: stable Anesthetic complications: no    Last Vitals:  Vitals:   11/13/17 1000 11/13/17 1032  BP: (!) 143/74 (!) 143/78  Pulse: 93 92  Resp: 19 (!) 21  Temp: 37.1 C   SpO2: 99% 96%    Last Pain:  Vitals:   11/13/17 1032  TempSrc:   PainSc: 0-No pain                 Janely Gullickson     

## 2017-11-13 NOTE — Plan of Care (Signed)
  Problem: Education: Goal: Knowledge of General Education information will improve Outcome: Progressing   

## 2017-11-13 NOTE — Op Note (Addendum)
[facility] Patient Name: Chloe Owens Procedure Date : 11/13/2017 MRN: [id number] Attending MD: Willaim Rayas. Adela Lank , MD Date of Birth: Oct 12, 1955 CSN: [id number] Age: 62 Admit Type: Inpatient Procedure:                Upper GI endoscopy Indications:              Hematemesis in the setting of vomiting, recent UTI                            - no further symptoms since admission. On aspirin /                            plavix, history of NSAID use Providers:                Willaim Rayas. Adela Lank, MD, Tomma Rakers, RN,                            Beryle Beams, Technician, Carmelina Dane, CRNA Referring MD:              Medicines:                Monitored Anesthesia Care Complications:            No immediate complications. Estimated blood loss:                            Minimal. Estimated Blood Loss:     Estimated blood loss was minimal. Procedure:                Pre-Anesthesia Assessment:                           - Prior to the procedure, a History and Physical                            was performed, and patient medications and                            allergies were reviewed. The patient's tolerance of                            previous anesthesia was also reviewed. The risks                            and benefits of the procedure and the sedation                            options and risks were discussed with the patient.                            All questions were answered, and informed consent                            was obtained. Prior Anticoagulants: The patient has  taken Plavix (clopidogrel), last dose was 3 days                            prior to procedure. ASA Grade Assessment: III - A                            patient with severe systemic disease. After                            reviewing the risks and benefits, the patient was                            deemed in satisfactory condition to undergo the                             procedure.                           After obtaining informed consent, the endoscope was                            passed under direct vision. Throughout the                            procedure, the patient's blood pressure, pulse, and                            oxygen saturations were monitored continuously. The                            EG-[id number] ([id number]) scope was introduced through the                            mouth, and advanced to the second part of duodenum.                            The upper GI endoscopy was accomplished without                            difficulty. The patient tolerated the procedure                            well. Scope In: Scope Out: Findings:      Esophagogastric landmarks were identified: the Z-line was found at 35       cm, the gastroesophageal junction was found at 35 cm and the upper       extent of the gastric folds was found at 38 cm from the incisors.      A 3 cm hiatal hernia was present.      The exam of the esophagus was otherwise normal. No esophagitis      A single 4 mm sessile polyp was found in the cardia within the hernia       sac. Biopsies were taken with a cold forceps for histology.      The exam of  the stomach was otherwise normal.      Biopsies were taken with a cold forceps in the gastric body, at the       incisura and in the gastric antrum for Helicobacter pylori testing.      A single 4-5 mm nodule was found in the duodenal bulb. Biopsies were       taken with a cold forceps for histology.      The exam of the duodenum was otherwise normal. Impression:               - Esophagogastric landmarks identified.                           - 3 cm hiatal hernia.                           - Normal esophagus otherwise                           - A single gastric polyp. Biopsied.                           - Nodule found in the duodenum. Biopsied.                           - Stomach otherwise normal.                           -  Biopsies were taken with a cold forceps for                            Helicobacter pylori testing given the patient's                            recent nausea / vomiting.                           Overall, no significant pathology to cause the                            symptoms, which could have been due to [employer] tear in the setting of vomiting which has                            since healed. Perhaps vomiting was related to acute                            illness (UTI), appears improved with therapy. Recommendation:           - Return patient to hospital ward for ongoing care.                           - Resume previous diet.                           -  Continue present medications.                           - Okay to resume Plavix later today                           - Await pathology results, this will be relayed to                            the patient once available.                           - Patient should follow up with us as outpatient to                            coordinate colonoscopy (no prior screening)                           - Continue bowel regimen for constipation                           - GI service will sign off for now, please call                            with questions moving forward Procedure Code(s):        --- Professional ---                           [phone] [id number], Esophagogastroduodenoscopy, flexible,                            transoral; with biopsy, single or multiple Diagnosis Code(s):        --- Professional ---                           K44.9, Diaphragmatic hernia without obstruction or                            gangrene                           K31.7, Polyp of stomach and duodenum                           K31.89, Other diseases of stomach and duodenum                           K92.0, Hematemesis CPT copyright 2017 American Medical Association. All rights reserved. The codes documented in this report are  preliminary and upon coder review may  be revised to meet current compliance requirements. Viviann SpareSteven P. Alvah Gilder, MD 11/13/2017 10:04:19 AM This report has been signed electronically. Number of Addenda: 0

## 2017-11-13 NOTE — Anesthesia Postprocedure Evaluation (Signed)
Anesthesia Post Note  [name]  Procedure(s) Performed: ESOPHAGOGASTRODUODENOSCOPY (EGD) WITH PROPOFOL (N/A ) BIOPSY     Patient location during evaluation: Endoscopy Anesthesia Type: MAC Level of consciousness: awake Pain management: pain level controlled Vital Signs Assessment: post-procedure vital signs reviewed and stable Respiratory status: spontaneous breathing Cardiovascular status: stable Anesthetic complications: no    Last Vitals:  Vitals:   11/13/17 1000 11/13/17 1032  BP: (!) 143/74 (!) 143/78  Pulse: 93 92  Resp: 19 (!) 21  Temp: 37.1 C   SpO2: 99% 96%    Last Pain:  Vitals:   11/13/17 1032  TempSrc:   PainSc: 0-No pain                 Robie Oats

## 2017-11-13 NOTE — Progress Notes (Signed)
Pt O2 sats dropping 86-88% while sleeping. Applied O2 2L nasal cannula

## 2017-11-13 NOTE — Progress Notes (Signed)
Pt became disoriented and upset after husband left. RN contacted husband to let pt talk to him. Husband confirmed that this is baseline for pt. He is returning to per pt request. Will continue to monitor.

## 2017-11-13 NOTE — Interval H&P Note (Signed)
History and Physical Interval Note:  11/13/2017 9:20 AM  Chloe Owens  has presented today for surgery, with the diagnosis of hematemesis  The various methods of treatment have been discussed with the patient and family. After consideration of risks, benefits and other options for treatment, the patient has consented to  Procedure(s): ESOPHAGOGASTRODUODENOSCOPY (EGD) WITH PROPOFOL (N/A) as a surgical intervention .  The patient's history has been reviewed, patient examined, no change in status, stable for surgery.  I have reviewed the patient's chart and labs.  Questions were answered to the patient's satisfaction.     Viviann SpareSteven P Lynzee Lindquist

## 2017-11-13 NOTE — Progress Notes (Signed)
[facility]                                                                              Patient Demographics  Chloe Owens, is a 62 y.o. female, DOB - 1955-12-06, WJX:[id number]  Admit date - 11/10/2017   Admitting Physician Briscoe Deutscher, MD  Outpatient Primary MD for the patient is Richardean Chimera, MD  Outpatient specialists:   LOS - 1  days   Medical records reviewed and are as summarized below:    Chief Complaint  Patient presents with  . Nausea       Brief summary   Patient is a 62 year old female with history of CVA, cognitive and motor deficits, IDDM, anxiety, depression, hypertension presented with nausea and vomiting.  Per family has been vomiting since 6/5, recently appeared brownish and like coffee grounds.  CT of the abdomen in the outside ED, suggested constipation but no acute findings.  She was diagnosed with UTI and started on antibiotics and laxative. FOBT negative, CBC showed a leukocytosis, patient was placed on IV fluids, IV Protonix and broad-spectrum antibiotics.  Admitted for sirs.    Assessment & Plan    Principal Problem:   SIRS (systemic inflammatory response syndrome) (HCC) -Presented with nausea and vomiting, CT abdomen pelvis at [facility] ED showed possible constipation otherwise no acute pathology - urine culture showed no growth.  Blood cultures negative so far.  -Leukocytosis improving, will narrow antibiotics down to IV Levaquin.  DC vancomycin and aztreonam   Active Problems: Nausea and vomiting, ?  Coffee-ground emesis with abdominal pain -Currently no nausea, vomiting, abdominal pain, possibly coffee-ground emesis from Mallory-Weiss tear - FOBT negative, H&H trending down.  GI was consulted.  Continue to hold Plavix -Underwent EGD which showed 3 cm hiatal hernia otherwise normal, nodule found in the duodenum biopsied, H. pylori testing.  Possibly Mallory-Weiss tear since then healed   Diabetes mellitus,  insulin-dependent, uncontrolled with hyperglycemia, type II CBGs better today, continue meal coverage 5 units 3 times daily AC, Lantus 25 units a.m., 15 units at bedtime.  And sliding scale insulin  History of CVA -Has residual cognitive and motor deficits. -Continue statin.  Plavix on hold, will restart once cleared from GI  Hypertension with sinus tachycardia -Hold lisinopril, placed on metoprolol 12.5 mg twice a day  CKD stage III Creatinine 1.2 on admission, at baseline Holding lisinopril, creatinine has normalized  Depression with anxiety Continue Abilify, Wellbutrin, Klonopin  Code Status: Full CODE STATUS DVT Prophylaxis:   SCD's Family Communication: Discussed in detail with the patient, all imaging results, lab results explained to the patient and family member at the bedside   Disposition Plan: PT evaluation today, hopefully DC in a.m.  Time Spent in minutes 25 minutes  Procedures:  EGD showed 3 cm hiatal hernia otherwise normal, nodule found in the duodenum biopsied, H. pylori testing.  Possibly Mallory-Weiss tear since then healed  Consultants:   GI  Antimicrobials:   IV vancomycin 6/9  IV aztreonam 6/9  IV Levaquin 6/9   Medications  Scheduled Meds: . ARIPiprazole  15 mg Oral Daily  . atorvastatin  40 mg  Oral q1800  . bisacodyl  10 mg Rectal Once  . buPROPion  150 mg Oral BID WC  . clonazePAM  0.5 mg Oral BID  . insulin aspart  0-5 Units Subcutaneous QHS  . insulin aspart  0-9 Units Subcutaneous TID WC  . insulin aspart  5 Units Subcutaneous TID WC  . insulin glargine  15 Units Subcutaneous QHS  . insulin glargine  25 Units Subcutaneous Daily  . metoprolol tartrate  12.5 mg Oral BID  . pantoprazole  40 mg Intravenous Q12H  . potassium chloride  40 mEq Oral Once  . senna  1 tablet Oral BID  . sodium chloride flush  3 mL Intravenous Q12H   Continuous Infusions: . levofloxacin (LEVAQUIN) IV Stopped (11/12/17 1140)   PRN Meds:.acetaminophen  **OR** acetaminophen, bisacodyl, ondansetron **OR** ondansetron (ZOFRAN) IV, polyethylene glycol, sodium phosphate, traMADol   Antibiotics   Anti-infectives (From admission, onward)   Start     Dose/Rate Route Frequency Ordered Stop   11/12/17 2200  levofloxacin (LEVAQUIN) IVPB 750 mg  Status:  Discontinued     750 mg 100 mL/hr over 90 Minutes Intravenous Every 48 hours 11/11/17 0359 11/12/17 0911   11/12/17 0915  levofloxacin (LEVAQUIN) IVPB 750 mg     750 mg 100 mL/hr over 90 Minutes Intravenous Every 24 hours 11/12/17 0911     11/11/17 1800  vancomycin (VANCOCIN) IVPB 750 mg/150 ml premix  Status:  Discontinued     750 mg 150 mL/hr over 60 Minutes Intravenous Every 12 hours 11/11/17 0359 11/13/17 1127   11/11/17 1000  aztreonam (AZACTAM) 1 g in sodium chloride 0.9 % 100 mL IVPB  Status:  Discontinued     1 g 200 mL/hr over 30 Minutes Intravenous Every 8 hours 11/11/17 0359 11/13/17 1127   11/11/17 0130  levofloxacin (LEVAQUIN) IVPB 750 mg     750 mg 100 mL/hr over 90 Minutes Intravenous  Once 11/11/17 0127 11/12/17 0824   11/11/17 0130  aztreonam (AZACTAM) 2 g in sodium chloride 0.9 % 100 mL IVPB     2 g 200 mL/hr over 30 Minutes Intravenous  Once 11/11/17 0127 11/11/17 0406   11/11/17 0130  vancomycin (VANCOCIN) IVPB 1000 mg/200 mL premix     1,000 mg 200 mL/hr over 60 Minutes Intravenous  Once 11/11/17 0127 11/11/17 0506        Subjective:   Chloe Owens was seen and examined today.  No nausea, vomiting or abdominal pain today.  No fevers or chills.  No acute issues.    Objective:   Vitals:   11/13/17 0349 11/13/17 0905 11/13/17 1000 11/13/17 1032  BP: 117/68 (!) 149/66 (!) 143/74 (!) 143/78  Pulse: 91 94 93 92  Resp: (!) 23 14 19  (!) 21  Temp: 98.1 F (36.7 C) 98.4 F (36.9 C) 98.7 F (37.1 C)   TempSrc: Oral Oral Oral   SpO2: 94% 91% 99% 96%  Weight: 89 kg (196 lb 3.4 oz) 84.8 kg (187 lb)    Height:  5\' 6"  (1.676 m)      Intake/Output Summary (Last 24  hours) at 11/13/2017 1200 Last data filed at 11/13/2017 1000 Gross per 24 hour  Intake 1265 ml  Output 800 ml  Net 465 ml     Wt Readings from Last 3 Encounters:  11/13/17 84.8 kg (187 lb)  10/25/16 72.1 kg (159 lb)  05/24/16 75.7 kg (166 lb 12.8 oz)     Exam   General: Alert and oriented  Eyes:  HEENT:  Atraumatic, normocephalic  Cardiovascular: S1 S2 auscultated, Regular rate and rhythm. No pedal edema b/l  Respiratory: Clear to auscultation bilaterally, no wheezing, rales or rhonchi  Gastrointestinal: Soft, nontender, nondistended, + bowel sounds  Ext: no pedal edema bilaterally  Neuro: no new deficit  Musculoskeletal: No digital cyanosis, clubbing  Skin: No rashes  Psych: Normal affect and demeanor,  Data Reviewed:  I have personally reviewed following labs and imaging studies  Micro Results Recent Results (from the past 240 hour(s))  Blood culture (routine x 2)     Status: None (Preliminary result)   Collection Time: 11/11/17  1:34 AM  Result Value Ref Range Status   Specimen Description BLOOD LEFT WRIST  Final   Special Requests   Final    BOTTLES DRAWN AEROBIC AND ANAEROBIC Blood Culture results may not be optimal due to an inadequate volume of blood received in culture bottles   Culture   Final    NO GROWTH 2 DAYS Performed at [facility], [address], [place], [place] [address]    Report Status PENDING  Incomplete  Urine culture     Status: None   Collection Time: 11/11/17  1:55 AM  Result Value Ref Range Status   Specimen Description URINE, RANDOM  Final   Special Requests NONE  Final   Culture   Final    NO GROWTH Performed at [facility], [address] [place], [place] [address]    Report Status 11/12/2017 FINAL  Final  Blood culture (routine x 2)     Status: None (Preliminary result)   Collection Time: 11/11/17  1:56 AM  Result Value Ref Range Status   Specimen Description BLOOD RIGHT ARM  Final   Special Requests    Final    BOTTLES DRAWN AEROBIC ONLY Blood Culture results may not be optimal due to an inadequate volume of blood received in culture bottles   Culture   Final    NO GROWTH 2 DAYS Performed at [facility], [address] [place], [place] [address]    Report Status PENDING  Incomplete    Radiology Reports Dg Chest 2 View  Result Date: 11/11/2017 CLINICAL DATA:  Nausea and vomiting 3 days. Diagnosed with UTI. On antibiotics. EXAM: CHEST - 2 VIEW COMPARISON:  11/10/2017 and 06/09/2017 FINDINGS: Lungs are adequately inflated with minimal linear atelectasis left base. No lobar consolidation or effusion. Cardiomediastinal silhouette is normal. Mild degenerate change of the spine. IMPRESSION: Minimal linear density left base likely atelectasis. Electronically Signed   By: Elberta Fortis M.D.   On: 11/11/2017 01:03    Lab Data:  CBC: Recent Labs  Lab 11/10/17 2039 11/11/17 0457 11/11/17 1314 11/11/17 2112 11/12/17 0342 11/13/17 0424  WBC 18.3* 22.0*  --   --  15.4* 14.5*  NEUTROABS 18.5*  --   --   --   --   --   HGB 14.7 9.8* 9.6* 9.4* 9.2* 8.9*  HCT 45.8 30.6* 29.7* 28.9* 28.2* 27.4*  MCV 85.8 87.9  --   --  84.4 84.0  PLT 163 238  --   --  229 228   Basic Metabolic Panel: Recent Labs  Lab 11/10/17 2039 11/11/17 0457 11/12/17 0342 11/13/17 0424  NA 136 138 139 139  K 4.1 4.0 3.5 3.2*  CL 99* 104 106 107  CO2 22 22 25 27   GLUCOSE 250* 339* 192* 129*  BUN 24* 27* 14 10  CREATININE 1.20* 1.24* 0.95  1.01*  CALCIUM 8.2* 7.6* 8.1* 8.1*   GFR: Estimated Creatinine Clearance: 61.7 mL/min (A) (by C-G formula based on SCr of 1.01 mg/dL (H)). Liver Function Tests: Recent Labs  Lab 11/10/17 2039  AST 23  ALT 22  ALKPHOS 78  BILITOT 1.2  PROT 6.6  ALBUMIN 3.1*   No results for input(s): LIPASE, AMYLASE in the last 168 hours. No results for input(s): AMMONIA in the last 168 hours. Coagulation Profile: No results for input(s): INR, PROTIME in the last 168  hours. Cardiac Enzymes: No results for input(s): CKTOTAL, CKMB, CKMBINDEX, TROPONINI in the last 168 hours. BNP (last 3 results) No results for input(s): PROBNP in the last 8760 hours. HbA1C: Recent Labs    11/12/17 0342  HGBA1C 9.8*   CBG: Recent Labs  Lab 11/12/17 1127 11/12/17 1711 11/12/17 2147 11/13/17 0757 11/13/17 1130  GLUCAP 180* 162* 119* 95 111*   Lipid Profile: No results for input(s): CHOL, HDL, LDLCALC, TRIG, CHOLHDL, LDLDIRECT in the last 72 hours. Thyroid Function Tests: No results for input(s): TSH, T4TOTAL, FREET4, T3FREE, THYROIDAB in the last 72 hours. Anemia Panel: No results for input(s): VITAMINB12, FOLATE, FERRITIN, TIBC, IRON, RETICCTPCT in the last 72 hours. Urine analysis:    Component Value Date/Time   COLORURINE YELLOW 11/11/2017 0155   APPEARANCEUR CLEAR 11/11/2017 0155   LABSPEC 1.022 11/11/2017 0155   PHURINE 5.0 11/11/2017 0155   GLUCOSEU >=500 (A) 11/11/2017 0155   HGBUR MODERATE (A) 11/11/2017 0155   BILIRUBINUR NEGATIVE 11/11/2017 0155   KETONESUR 20 (A) 11/11/2017 0155   PROTEINUR 30 (A) 11/11/2017 0155   UROBILINOGEN 1.0 06/03/2014 0440   NITRITE NEGATIVE 11/11/2017 0155   LEUKOCYTESUR NEGATIVE 11/11/2017 0155     Chloe Owens M.D. [facility] 11/13/2017, 12:00 PM  Pager: [phone] Between 7am to 7pm - call Pager - [phone]  After 7pm go to [email or web address] - password [email or web address]  Call night coverage person covering after 7pm

## 2017-11-13 NOTE — Transfer of Care (Signed)
Immediate Anesthesia Transfer of Care Note  [name]  Procedure(s) Performed: ESOPHAGOGASTRODUODENOSCOPY (EGD) WITH PROPOFOL (N/A ) BIOPSY  Patient Location: PACU  Anesthesia Type:MAC  Level of Consciousness: awake  Airway & Oxygen Therapy: Patient Spontanous Breathing and Patient connected to nasal cannula oxygen  Post-op Assessment: Report given to RN, Post -op Vital signs reviewed and stable and Patient moving all extremities  Post vital signs: Reviewed and stable  Last Vitals:  Vitals Value Taken Time  BP 141/74 11/13/2017 10:01 AM  Temp    Pulse 93 11/13/2017 10:01 AM  Resp 21 11/13/2017 10:02 AM  SpO2 94 % 11/13/2017 10:01 AM  Vitals shown include unvalidated device data.  Last Pain:  Vitals:   11/13/17 0905  TempSrc: Oral  PainSc:       Patients Stated Pain Goal: 0 (34/28/76 8115)  Complications: No apparent anesthesia complications

## 2017-11-13 NOTE — Anesthesia Preprocedure Evaluation (Addendum)
Anesthesia Evaluation  Patient identified by MRN, date of birth, ID band Patient awake    Reviewed: Allergy & Precautions, NPO status , Patient's Chart, lab work & pertinent test results  Airway Mallampati: II       Dental  (+) Poor Dentition, Dental Advisory Given   Pulmonary former smoker,    breath sounds clear to auscultation       Cardiovascular hypertension,  Rhythm:Regular Rate:Normal     Neuro/Psych    GI/Hepatic   Endo/Other  diabetes  Renal/GU      Musculoskeletal   Abdominal   Peds  Hematology   Anesthesia Other Findings   Reproductive/Obstetrics                           Anesthesia Physical Anesthesia Plan  ASA: III  Anesthesia Plan: MAC   Post-op Pain Management:    Induction: Intravenous  PONV Risk Score and Plan: Treatment may vary due to age or medical condition  Airway Management Planned: Nasal Cannula and Simple Face Mask  Additional Equipment:   Intra-op Plan:   Post-operative Plan:   Informed Consent: I have reviewed the patients History and Physical, chart, labs and discussed the procedure including the risks, benefits and alternatives for the proposed anesthesia with the patient or authorized representative who has indicated his/her understanding and acceptance.   Dental advisory given  Plan Discussed with: CRNA, Anesthesiologist and Surgeon  Anesthesia Plan Comments:         Anesthesia Quick Evaluation

## 2017-11-14 DIAGNOSIS — R112 Nausea with vomiting, unspecified: Secondary | ICD-10-CM | POA: Diagnosis not present

## 2017-11-14 DIAGNOSIS — R6510 Systemic inflammatory response syndrome (SIRS) of non-infectious origin without acute organ dysfunction: Secondary | ICD-10-CM | POA: Diagnosis not present

## 2017-11-14 DIAGNOSIS — K226 Gastro-esophageal laceration-hemorrhage syndrome: Secondary | ICD-10-CM | POA: Diagnosis not present

## 2017-11-14 LAB — BASIC METABOLIC PANEL
Anion gap: 7 (ref 5–15)
BUN: 6 mg/dL (ref 6–20)
CO2: 28 mmol/L (ref 22–32)
Calcium: 8.3 mg/dL — ABNORMAL LOW (ref 8.9–10.3)
Chloride: 106 mmol/L (ref 101–111)
Creatinine, Ser: 0.81 mg/dL (ref 0.44–1.00)
GFR calc Af Amer: >60 mL/min (ref >60)
GFR calc non Af Amer: >60 mL/min (ref >60)
Glucose, Bld: 139 mg/dL — ABNORMAL HIGH (ref 65–99)
Potassium: 3.5 mmol/L (ref 3.5–5.1)
Sodium: 141 mmol/L (ref 135–145)

## 2017-11-14 LAB — CBC
HCT: 27.5 % — ABNORMAL LOW (ref 36.0–46.0)
Hemoglobin: 8.9 g/dL — ABNORMAL LOW (ref 12.0–15.0)
MCH: 27.2 pg (ref 26.0–34.0)
MCHC: 32.4 g/dL (ref 30.0–36.0)
MCV: 84.1 fL (ref 78.0–100.0)
Platelets: 230 10*3/uL (ref 150–400)
RBC: 3.27 MIL/uL — ABNORMAL LOW (ref 3.87–5.11)
RDW: 13.1 % (ref 11.5–15.5)
WBC: 11.3 10*3/uL — ABNORMAL HIGH (ref 4.0–10.5)

## 2017-11-14 LAB — GLUCOSE, CAPILLARY
Glucose-Capillary: 126 mg/dL — ABNORMAL HIGH (ref 65–99)
Glucose-Capillary: 222 mg/dL — ABNORMAL HIGH (ref 65–99)

## 2017-11-14 MED ORDER — METOPROLOL TARTRATE 25 MG PO TABS: 12.5000 mg | ORAL_TABLET | Freq: Two times a day (BID) | ORAL | 0 refills | Status: AC

## 2017-11-14 MED ORDER — LORAZEPAM 2 MG/ML IJ SOLN: 1.0000 mg | Freq: Four times a day (QID) | INTRAMUSCULAR | Status: DC | PRN

## 2017-11-14 MED ORDER — ACETAMINOPHEN 325 MG PO TABS: 650.0000 mg | ORAL_TABLET | Freq: Four times a day (QID) | ORAL | Status: AC | PRN

## 2017-11-14 MED ORDER — INSULIN GLARGINE 100 UNIT/ML ~~LOC~~ SOLN: 30.0000 [IU] | Freq: Every day | SUBCUTANEOUS | Status: AC

## 2017-11-14 MED ORDER — CLONAZEPAM 0.5 MG PO TABS
0.5000 mg | ORAL_TABLET | Freq: Two times a day (BID) | ORAL | Status: DC
  Administered 2017-11-14: 0.5 mg via ORAL
  Filled 2017-11-14: qty 1

## 2017-11-14 MED ORDER — DULOXETINE HCL 60 MG PO CPEP: 60.0000 mg | ORAL_CAPSULE | Freq: Every day | ORAL | Status: AC

## 2017-11-14 NOTE — Evaluation (Signed)
Physical Therapy Evaluation Patient Details Name: Chloe Owens MRN: [id number] DOB: [birth year] Today's Date: 11/14/2017   History of Present Illness  Pt is a 62 y/o female admitted secondary to increased emesis. Thought to have SIRS. Pt is also s/p EGD. PMH includes CVA with R residual weakness, HTN, DM, CKD, and ORIF of ankle.   Clinical Impression  Pt admitted secondary to problem above with deficits below. Pt requiring mod a +2 to stand, and demonstrated heavy posterior lean. Unable to correct posterior lean, and pt frustrated throughout, therefore further mobility deferred. Feel pt would benefit from short term SNF at d/c, however, pt and husband refusing at this time. Educated about using transport chair at home, and pt's husband reports comfort in being able to transfer pt into chair. Will continue to follow acutely to maximize functional mobility independence and safety.     Follow Up Recommendations Home health PT;Supervision/Assistance - 24 hour(HHOT; pt's husband refusing Aide at this time)    Equipment Recommendations  None recommended by PT    Recommendations for Other Services OT consult     Precautions / Restrictions Precautions Precautions: Fall Restrictions Weight Bearing Restrictions: No      Mobility  Bed Mobility               General bed mobility comments: In chair upon entry   Transfers Overall transfer level: Needs assistance Equipment used: Rolling walker (2 wheeled) Transfers: Sit to/from Stand Sit to Stand: Mod assist;+2 physical assistance         General transfer comment: Mod A +2 for lift assist and steadying in standing. Husband assisted with transfer with good technique. Pt with posterior lean in standing, and unable to self correct, so further mobility deferred. Pt frustrated during transfer as well. Educated about using transport chair at home for mobility. Pt's husband verbalized how he transfers pt at home, and verbalized safe technique.    Ambulation/Gait         Gait velocity: Unsafe to attempt       Stairs            Wheelchair Mobility    Modified Rankin (Stroke Patients Only)       Balance Overall balance assessment: Needs assistance Sitting-balance support: No upper extremity supported;Feet supported Sitting balance-Leahy Scale: Fair   Postural control: Posterior lean Standing balance support: Bilateral upper extremity supported;During functional activity Standing balance-Leahy Scale: Poor Standing balance comment: Posterior lean noted in standing and required external assist to maintain standing balance.                              Pertinent Vitals/Pain Pain Assessment: No/denies pain    Home Living Family/patient expects to be discharged to:: Private residence Living Arrangements: Spouse/significant other Available Help at Discharge: Family;Available 24 hours/day Type of Home: House Home Access: Ramped entrance     Home Layout: One level Home Equipment: Tub bench;Bedside commode;Walker - 2 wheels;Transport chair      Prior Function Level of Independence: Needs assistance   Gait / Transfers Assistance Needed: Used RW for ambulation in house, otherwise used transport chair. Husband assisted with ambulation.   ADL's / Homemaking Assistance Needed: Husband assisted with bathing/dressing.         Hand Dominance        Extremity/Trunk Assessment   Upper Extremity Assessment Upper Extremity Assessment: Defer to OT evaluation    Lower Extremity Assessment Lower Extremity Assessment: RLE deficits/detail;Generalized  weakness RLE Deficits / Details: Functional weakness noted. Decreased AROM noted in knee flexion and ankle ROM.        Communication   Communication: No difficulties  Cognition Arousal/Alertness: Awake/alert Behavior During Therapy: Agitated;Flat affect Overall Cognitive Status: History of cognitive impairments - at baseline                                  General Comments: Pt easily frustrated during mobility tasks when unable to perform.       General Comments General comments (skin integrity, edema, etc.): Educated about SNF, however, pt adamantly refusing, and pt's husband wanting to take pt home.     Exercises     Assessment/Plan    PT Assessment Patient needs continued PT services  PT Problem List Decreased strength;Decreased balance;Decreased mobility;Decreased cognition;Decreased knowledge of use of DME;Decreased safety awareness;Decreased knowledge of precautions;Pain       PT Treatment Interventions DME instruction;Gait training;Functional mobility training;Therapeutic activities;Therapeutic exercise;Balance training;Patient/family education;Cognitive remediation    PT Goals (Current goals can be found in the Care Plan section)  Acute Rehab PT Goals Patient Stated Goal: to go home  PT Goal Formulation: With patient Time For Goal Achievement: 11/28/17 Potential to Achieve Goals: Good    Frequency Min 3X/week   Barriers to discharge        Co-evaluation               AM-PAC PT "6 Clicks" Daily Activity  Outcome Measure Difficulty turning over in bed (including adjusting bedclothes, sheets and blankets)?: A Lot Difficulty moving from lying on back to sitting on the side of the bed? : Unable Difficulty sitting down on and standing up from a chair with arms (e.g., wheelchair, bedside commode, etc,.)?: Unable Help needed moving to and from a bed to chair (including a wheelchair)?: A Lot Help needed walking in hospital room?: Total Help needed climbing 3-5 steps with a railing? : Total 6 Click Score: 8    End of Session Equipment Utilized During Treatment: Gait belt Activity Tolerance: Patient tolerated treatment well Patient left: in chair;with call bell/phone within reach;with family/visitor present Nurse Communication: Mobility status PT Visit Diagnosis: Unsteadiness on feet  (R26.81);Muscle weakness (generalized) (M62.81);Difficulty in walking, not elsewhere classified (R26.2)    Time: [id number] PT Time Calculation (min) (ACUTE ONLY): 25 min   Charges:   PT Evaluation $PT Eval Moderate Complexity: 1 Mod PT Treatments $Therapeutic Activity: 8-22 mins   PT G Codes:        Gladys DammeBrittany Anaalicia Reimann, PT, DPT  Acute Rehabilitation Services  Pager: [phone]   Lehman PromBrittany S Maryruth Apple 11/14/2017, 12:54 PM

## 2017-11-14 NOTE — Care Management Note (Signed)
Case Management Note  Patient Details  Name: Aldona Barerry J Bordelon MRN: [id number] Date of Birth: 12-28-1955  Subjective/Objective:  SIRS,  UTI, nausea, vomiting               Action/Plan: Spoke to pt and husband at bedside. [facility]. Provided husband with list and he agreeable to [facility]. [facility] but they were satisfied with services. Contacted [facility] with new referral. She has RW, transport chair, tub bench, and bedside commode at home.     Expected Discharge Date:  11/14/17               Expected Discharge Plan:  Home w Home Health Services  In-House Referral:  NA  Discharge planning Services  CM Consult  Post Acute Care Choice:  Home Health Choice offered to:  Spouse  DME Arranged:  N/A DME Agency:  NA  HH Arranged:  PT, OT HH Agency:  [facility]  Status of Service:  Completed, signed off  If discussed at [employer] of [employer] Meetings, dates discussed:    Additional Comments:  Elliot CousinShavis, Khiya Friese Ellen, RN 11/14/2017, 4:47 PM

## 2017-11-14 NOTE — Consult Note (Signed)
   [facility] CM Inpatient Consult   11/14/2017  [place] May 01, 1956 [id number]  Patient screened for admission and referral to  [place] Management services prior to admission. Patient is in the [place] of the [place] Management services under patient's [employer].   Met with the patient and her husband Garnet Koyanagi at the bedside regarding referral and needs.  Patient nods her head to questions for 'yes or no' regarding ongoing needs.  Husband states no additional needs.  Provided the patient and husband with information regarding the virtual visits offered by [employer].   Follow up: Patient evaluated for [place] Management services.  Patient is not currently a beneficiary of the attributed [place] in the [employer].  Her Primary Care Provider is not a [facility] primary care provider or is not [facility] affiliated.  Gar Ponto, MD is not in [place]. This patient is Not eligible for [facility] Services.   Reason:  Not a beneficiary currently attributed to one of the [place].  Membership roster was used to verify non- eligible status.  Left a voicemail at the patient's home for a return call.   For questions contact:   Natividad Brood, RN BSN [facility] Liaison  [phone] business mobile phone Toll free office [phone]

## 2017-11-14 NOTE — Discharge Summary (Signed)
Physician Discharge Summary  Sherrian Diverserry J Mittag WGN:[id number] DOB: 1956-03-09 DOA: 11/10/2017  PCP: [name], Enid G, MD  Admit date: 11/10/2017 Discharge date: 11/14/2017  Time spent: 35 minutes  Recommendations for Outpatient Follow-up:  1. PCP Dr. Reuel Boomaniel in 7-10 days 2. Home health PT and OT 3. Please follow-up duodenal nodule biopsy and H. Pylori testing   Discharge Diagnoses:  Principal Problem:   SIRS (systemic inflammatory response syndrome) (HCC) Active Problems:   Generalized anxiety disorder   History of CVA with residual deficit   Benign essential HTN   Diabetes mellitus type 2 in nonobese (HCC)   CKD (chronic kidney disease), stage III (HCC)   Hematemesis without nausea   Cognitive dysfunction   Suspected vascular dementia  Discharge Condition: improved  Diet recommendation: diabetic, heart healthy  Filed Weights   11/13/17 0349 11/13/17 0905 11/14/17 0535  Weight: 89 kg (196 lb 3.4 oz) 84.8 kg (187 lb) 86.7 kg (191 lb 2.2 oz)    History of present illness:  Patient is a 62 year old female with history of CVA, cognitive and motor deficits, IDDM, anxiety, depression, hypertension presented with nausea and vomiting.  Per family has been vomiting since 6/5, recently appeared brownish and like coffee grounds.  CT of the abdomen in the outside ED, suggested constipation but no acute findings.  She was diagnosed with UTI and started on antibiotics and laxative. FOBT negative, CBC showed a leukocytosis, patient was placed on IV fluids, IV Protonix and broad-spectrum antibiotics  Hospital Course:   SIRS (systemic inflammatory response syndrome) (HCC) -Patient presented to outside hospital ED with nausea vomiting, mild leukocytosis, abnormal urinalysis and coffee-ground emesis -[facility] is notable for constipation, otherwise unremarkable -Treated with empiric antibiotics for possible UTI, laxatives -all cultures including urine culture came back  negative, she has remained afebrile improvement in leukocytosis -Antibiotics were discontinued by my partner yesterday -no further nausea or vomiting noted, remained afebrile and leukocytosis is improving at this time  Nausea/vomiting/coffee-ground emesis -Suspected Mallory-Weiss tear -Gastroenterology was consulted early this admission,, her Plavix was held temporarily underwent an EGD which showed a 3 cm hiatal hernia nodule in the duodenum biopsied, H. Pylori test pending -No source of upper GI bleed noted, suspected to have had a possible Mallory-Weiss tear from recurrent vomiting -PPI was continued, symptoms resolved -Back to baseline, tolerating diet at the time of discharge -Plavix resumed  Diabetes mellitus, insulin-dependent, uncontrolled with hyperglycemia, type II -stable, continued on Lantus and sliding-scale insulin  History of CVA -Has residual cognitive and motor deficits. -suspected to have vascular dementia -continue Plavix and statin -discontinued mybertriq due to anticholinergic side effects  Hypertension with sinus tachycardia -stopped lisinopril, started low-dose metoprolol this admission  AKI on CKD stage III -improved with hydration, lisinopril discontinued -Creatinine 1.2 on admission Holding lisinopril, creatinine has normalized  Depression with anxiety Continue Abilify, Wellbutrin, Klonopin    Discharge Exam: Vitals:   11/13/17 2203 11/14/17 0535  BP: (!) 128/91 (!) 142/82  Pulse: (!) 102 94  Resp:  19  Temp:  98.9 F (37.2 C)  SpO2:  93%    General: AAOx2, slow response  Cardiovascular: S1S2/RRR Respiratory: CTAB  Discharge Instructions   Discharge Instructions    Diet - low sodium heart healthy   Complete by:  As directed    Diet Carb Modified   Complete by:  As directed    Increase activity slowly   Complete by:  As directed      Allergies  as of 11/14/2017      Reactions   Codeine Nausea And Vomiting, Rash   Penicillins  Rash   Has patient had a PCN reaction causing immediate rash, facial/tongue/throat swelling, SOB or lightheadedness with hypotension: Yes Has patient had a PCN reaction causing severe rash involving mucus membranes or skin necrosis: No Has patient had a PCN reaction that required hospitalization: No Has patient had a PCN reaction occurring within the last 10 years: No If all of the above answers are "NO", then may proceed with Cephalosporin use.      Medication List    STOP taking these medications   lisinopril 20 MG tablet Commonly known as:  PRINIVIL,ZESTRIL   MYRBETRIQ PO   traMADol 50 MG tablet Commonly known as:  ULTRAM     TAKE these medications   acetaminophen 325 MG tablet Commonly known as:  TYLENOL Take 2 tablets (650 mg total) by mouth every 6 (six) hours as needed for mild pain (or Fever >/= 101).   amLODipine 10 MG tablet Commonly known as:  NORVASC Take 10 mg by mouth daily.   ARIPiprazole 30 MG tablet Commonly known as:  ABILIFY Take 0.5 tablets (15 mg total) by mouth daily.   aspirin EC 81 MG tablet Take 81 mg by mouth daily.   atorvastatin 40 MG tablet Commonly known as:  LIPITOR Take 1 tablet (40 mg total) by mouth daily at 6 PM.   buPROPion 150 MG 12 hr tablet Commonly known as:  WELLBUTRIN SR Take 150 mg by mouth 2 (two) times daily.   clonazePAM 0.5 MG tablet Commonly known as:  KLONOPIN Take 1 tablet (0.5 mg total) by mouth 2 (two) times daily.   clopidogrel 75 MG tablet Commonly known as:  PLAVIX Take 1 tablet (75 mg total) by mouth daily.   DULoxetine 60 MG capsule Commonly known as:  CYMBALTA Take 1 capsule (60 mg total) by mouth daily. What changed:  when to take this   insulin glargine 100 UNIT/ML injection Commonly known as:  LANTUS Inject 0.3 mLs (30 Units total) into the skin daily. What changed:    how much to take  Another medication with the same name was removed. Continue taking this medication, and follow the  directions you see here.   JANUVIA 100 MG tablet Generic drug:  sitaGLIPtin Take 100 mg by mouth every evening.   metoprolol tartrate 25 MG tablet Commonly known as:  LOPRESSOR Take 0.5 tablets (12.5 mg total) by mouth 2 (two) times daily.   ondansetron 8 MG disintegrating tablet Commonly known as:  ZOFRAN-ODT TAKE 1 TABLET BY MOUTH FOUR TIMES DAILY AS NEEDED FOR NAUSEA   pantoprazole 40 MG tablet Commonly known as:  PROTONIX Take 1 tablet (40 mg total) by mouth 2 (two) times daily before a meal.   polyethylene glycol packet Commonly known as:  MIRALAX / GLYCOLAX Take 17 g by mouth daily. What changed:    when to take this  reasons to take this   senna-docusate 8.6-50 MG tablet Commonly known as:  Senokot-S Take 2 tablets by mouth at bedtime. What changed:  how much to take   TRULICITY 1.5 MG/0.5ML Sopn Generic drug:  Dulaglutide Inject 0.5 mLs into the skin once a week.      Allergies  Allergen Reactions  . Codeine Nausea And Vomiting and Rash  . Penicillins Rash    Has patient had a PCN reaction causing immediate rash, facial/tongue/throat swelling, SOB or lightheadedness with hypotension: Yes Has patient  had a PCN reaction causing severe rash involving mucus membranes or skin necrosis: No Has patient had a PCN reaction that required hospitalization: No Has patient had a PCN reaction occurring within the last 10 years: No If all of the above answers are "NO", then may proceed with Cephalosporin use.    Follow-up Information    [facility] Follow up.   Specialty:  Home Health Services Why:  Home Health Physical Therapy and Occupational Therapy-agency will call to arrange initial appointment Contact information: [address] [place] [address] [place] [address] [phone]            The results of significant diagnostics from this hospitalization (including imaging, microbiology, ancillary and laboratory) are listed below for reference.     Significant Diagnostic Studies: Dg Chest 2 View  Result Date: 11/11/2017 CLINICAL DATA:  Nausea and vomiting 3 days. Diagnosed with UTI. On antibiotics. EXAM: CHEST - 2 VIEW COMPARISON:  11/10/2017 and 06/09/2017 FINDINGS: Lungs are adequately inflated with minimal linear atelectasis left base. No lobar consolidation or effusion. Cardiomediastinal silhouette is normal. Mild degenerate change of the spine. IMPRESSION: Minimal linear density left base likely atelectasis. Electronically Signed   By: Elberta Fortis M.D.   On: 11/11/2017 01:03    Microbiology: Recent Results (from the past 240 hour(s))  Blood culture (routine x 2)     Status: None (Preliminary result)   Collection Time: 11/11/17  1:34 AM  Result Value Ref Range Status   Specimen Description BLOOD LEFT WRIST  Final   Special Requests   Final    BOTTLES DRAWN AEROBIC AND ANAEROBIC Blood Culture results may not be optimal due to an inadequate volume of blood received in culture bottles   Culture   Final    NO GROWTH 3 DAYS Performed at [facility], [address], [place], [place] [address]    Report Status PENDING  Incomplete  Urine culture     Status: None   Collection Time: 11/11/17  1:55 AM  Result Value Ref Range Status   Specimen Description URINE, RANDOM  Final   Special Requests NONE  Final   Culture   Final    NO GROWTH Performed at [facility], [address] [place], [place] [address]    Report Status 11/12/2017 FINAL  Final  Blood culture (routine x 2)     Status: None (Preliminary result)   Collection Time: 11/11/17  1:56 AM  Result Value Ref Range Status   Specimen Description BLOOD RIGHT ARM  Final   Special Requests   Final    BOTTLES DRAWN AEROBIC ONLY Blood Culture results may not be optimal due to an inadequate volume of blood received in culture bottles   Culture   Final    NO GROWTH 3 DAYS Performed at [facility], [address] [place], [place] [address]    Report Status  PENDING  Incomplete     Labs: Basic Metabolic Panel: Recent Labs  Lab 11/10/17 2039 11/11/17 0457 11/12/17 0342 11/13/17 0424 11/14/17 0403  NA 136 138 139 139 141  K 4.1 4.0 3.5 3.2* 3.5  CL 99* 104 106 107 106  CO2 22 22 25 27 28   GLUCOSE 250* 339* 192* 129* 139*  BUN 24* 27* 14 10 6   CREATININE 1.20* 1.24* 0.95 1.01* 0.81  CALCIUM 8.2* 7.6* 8.1* 8.1* 8.3*   Liver Function Tests: Recent Labs  Lab 11/10/17 2039  AST 23  ALT 22  ALKPHOS 78  BILITOT 1.2  PROT 6.6  ALBUMIN 3.1*   No results for input(s): LIPASE, AMYLASE in the last 168 hours. No results for input(s): AMMONIA in the last 168 hours. CBC: Recent Labs  Lab 11/10/17 2039 11/11/17 0457 11/11/17 1314 11/11/17 2112 11/12/17 0342 11/13/17 0424 11/14/17 0403  WBC 18.3* 22.0*  --   --  15.4* 14.5* 11.3*  NEUTROABS 18.5*  --   --   --   --   --   --   HGB 14.7 9.8* 9.6* 9.4* 9.2* 8.9* 8.9*  HCT 45.8 30.6* 29.7* 28.9* 28.2* 27.4* 27.5*  MCV 85.8 87.9  --   --  84.4 84.0 84.1  PLT 163 238  --   --  229 228 230   Cardiac Enzymes: No results for input(s): CKTOTAL, CKMB, CKMBINDEX, TROPONINI in the last 168 hours. BNP: BNP (last 3 results) No results for input(s): BNP in the last 8760 hours.  ProBNP (last 3 results) No results for input(s): PROBNP in the last 8760 hours.  CBG: Recent Labs  Lab 11/13/17 1130 11/13/17 1652 11/13/17 2101 11/14/17 0740 11/14/17 1125  GLUCAP 111* 142* 241* 126* 222*       Signed:  Zannie Cove MD.  [facility] 11/14/2017, 3:25 PM

## 2017-11-14 NOTE — Care Management Important Message (Signed)
Important Message  Patient Details  Name: Chloe Owens MRN: [id number] Date of Birth: [birth year]   Medicare Important Message Given:  Yes    Elliot CousinShavis, Colter Magowan Ellen, RN 11/14/2017, 12:34 PM

## 2017-11-16 LAB — CULTURE, BLOOD (ROUTINE X 2)
Culture: NO GROWTH
Culture: NO GROWTH

## 2019-06-19 DIAGNOSIS — E1122 Type 2 diabetes mellitus with diabetic chronic kidney disease: Secondary | ICD-10-CM | POA: Diagnosis not present

## 2019-06-19 DIAGNOSIS — I10 Essential (primary) hypertension: Secondary | ICD-10-CM | POA: Diagnosis not present

## 2019-06-19 DIAGNOSIS — F331 Major depressive disorder, recurrent, moderate: Secondary | ICD-10-CM | POA: Diagnosis not present

## 2019-06-19 DIAGNOSIS — M1712 Unilateral primary osteoarthritis, left knee: Secondary | ICD-10-CM | POA: Diagnosis not present

## 2019-06-19 DIAGNOSIS — J301 Allergic rhinitis due to pollen: Secondary | ICD-10-CM | POA: Diagnosis not present

## 2019-06-19 DIAGNOSIS — G8114 Spastic hemiplegia affecting left nondominant side: Secondary | ICD-10-CM | POA: Diagnosis not present

## 2019-06-19 DIAGNOSIS — E782 Mixed hyperlipidemia: Secondary | ICD-10-CM | POA: Diagnosis not present

## 2019-06-19 DIAGNOSIS — D509 Iron deficiency anemia, unspecified: Secondary | ICD-10-CM | POA: Diagnosis not present

## 2019-06-26 DIAGNOSIS — E1122 Type 2 diabetes mellitus with diabetic chronic kidney disease: Secondary | ICD-10-CM | POA: Diagnosis not present

## 2019-06-26 DIAGNOSIS — I10 Essential (primary) hypertension: Secondary | ICD-10-CM | POA: Diagnosis not present

## 2019-06-26 DIAGNOSIS — D631 Anemia in chronic kidney disease: Secondary | ICD-10-CM | POA: Diagnosis not present

## 2019-06-26 DIAGNOSIS — N183 Chronic kidney disease, stage 3 unspecified: Secondary | ICD-10-CM | POA: Diagnosis not present

## 2019-06-26 DIAGNOSIS — M1712 Unilateral primary osteoarthritis, left knee: Secondary | ICD-10-CM | POA: Diagnosis not present

## 2019-06-26 DIAGNOSIS — E782 Mixed hyperlipidemia: Secondary | ICD-10-CM | POA: Diagnosis not present

## 2019-06-26 DIAGNOSIS — G8114 Spastic hemiplegia affecting left nondominant side: Secondary | ICD-10-CM | POA: Diagnosis not present

## 2019-06-26 DIAGNOSIS — R1310 Dysphagia, unspecified: Secondary | ICD-10-CM | POA: Diagnosis not present

## 2019-06-26 DIAGNOSIS — D500 Iron deficiency anemia secondary to blood loss (chronic): Secondary | ICD-10-CM | POA: Diagnosis not present

## 2019-07-04 DIAGNOSIS — E1122 Type 2 diabetes mellitus with diabetic chronic kidney disease: Secondary | ICD-10-CM | POA: Diagnosis not present

## 2019-07-04 DIAGNOSIS — E7849 Other hyperlipidemia: Secondary | ICD-10-CM | POA: Diagnosis not present

## 2019-07-07 DIAGNOSIS — M1712 Unilateral primary osteoarthritis, left knee: Secondary | ICD-10-CM | POA: Diagnosis not present

## 2019-07-07 DIAGNOSIS — R1310 Dysphagia, unspecified: Secondary | ICD-10-CM | POA: Diagnosis not present

## 2019-07-07 DIAGNOSIS — E1122 Type 2 diabetes mellitus with diabetic chronic kidney disease: Secondary | ICD-10-CM | POA: Diagnosis not present

## 2019-07-07 DIAGNOSIS — N183 Chronic kidney disease, stage 3 unspecified: Secondary | ICD-10-CM | POA: Diagnosis not present

## 2019-07-07 DIAGNOSIS — D631 Anemia in chronic kidney disease: Secondary | ICD-10-CM | POA: Diagnosis not present

## 2019-07-07 DIAGNOSIS — G8114 Spastic hemiplegia affecting left nondominant side: Secondary | ICD-10-CM | POA: Diagnosis not present

## 2019-07-07 DIAGNOSIS — D500 Iron deficiency anemia secondary to blood loss (chronic): Secondary | ICD-10-CM | POA: Diagnosis not present

## 2019-07-07 DIAGNOSIS — I10 Essential (primary) hypertension: Secondary | ICD-10-CM | POA: Diagnosis not present

## 2019-07-07 DIAGNOSIS — E782 Mixed hyperlipidemia: Secondary | ICD-10-CM | POA: Diagnosis not present

## 2019-07-09 DIAGNOSIS — M1712 Unilateral primary osteoarthritis, left knee: Secondary | ICD-10-CM | POA: Diagnosis not present

## 2019-07-09 DIAGNOSIS — R1310 Dysphagia, unspecified: Secondary | ICD-10-CM | POA: Diagnosis not present

## 2019-07-09 DIAGNOSIS — I10 Essential (primary) hypertension: Secondary | ICD-10-CM | POA: Diagnosis not present

## 2019-07-09 DIAGNOSIS — D631 Anemia in chronic kidney disease: Secondary | ICD-10-CM | POA: Diagnosis not present

## 2019-07-09 DIAGNOSIS — E782 Mixed hyperlipidemia: Secondary | ICD-10-CM | POA: Diagnosis not present

## 2019-07-09 DIAGNOSIS — G8114 Spastic hemiplegia affecting left nondominant side: Secondary | ICD-10-CM | POA: Diagnosis not present

## 2019-07-09 DIAGNOSIS — E1122 Type 2 diabetes mellitus with diabetic chronic kidney disease: Secondary | ICD-10-CM | POA: Diagnosis not present

## 2019-07-09 DIAGNOSIS — N183 Chronic kidney disease, stage 3 unspecified: Secondary | ICD-10-CM | POA: Diagnosis not present

## 2019-07-09 DIAGNOSIS — D500 Iron deficiency anemia secondary to blood loss (chronic): Secondary | ICD-10-CM | POA: Diagnosis not present

## 2019-07-14 DIAGNOSIS — R1310 Dysphagia, unspecified: Secondary | ICD-10-CM | POA: Diagnosis not present

## 2019-07-14 DIAGNOSIS — N183 Chronic kidney disease, stage 3 unspecified: Secondary | ICD-10-CM | POA: Diagnosis not present

## 2019-07-14 DIAGNOSIS — D631 Anemia in chronic kidney disease: Secondary | ICD-10-CM | POA: Diagnosis not present

## 2019-07-14 DIAGNOSIS — D500 Iron deficiency anemia secondary to blood loss (chronic): Secondary | ICD-10-CM | POA: Diagnosis not present

## 2019-07-14 DIAGNOSIS — I10 Essential (primary) hypertension: Secondary | ICD-10-CM | POA: Diagnosis not present

## 2019-07-14 DIAGNOSIS — E782 Mixed hyperlipidemia: Secondary | ICD-10-CM | POA: Diagnosis not present

## 2019-07-14 DIAGNOSIS — E1122 Type 2 diabetes mellitus with diabetic chronic kidney disease: Secondary | ICD-10-CM | POA: Diagnosis not present

## 2019-07-14 DIAGNOSIS — G8114 Spastic hemiplegia affecting left nondominant side: Secondary | ICD-10-CM | POA: Diagnosis not present

## 2019-07-14 DIAGNOSIS — M1712 Unilateral primary osteoarthritis, left knee: Secondary | ICD-10-CM | POA: Diagnosis not present

## 2019-07-16 DIAGNOSIS — R1310 Dysphagia, unspecified: Secondary | ICD-10-CM | POA: Diagnosis not present

## 2019-07-16 DIAGNOSIS — E782 Mixed hyperlipidemia: Secondary | ICD-10-CM | POA: Diagnosis not present

## 2019-07-16 DIAGNOSIS — E1122 Type 2 diabetes mellitus with diabetic chronic kidney disease: Secondary | ICD-10-CM | POA: Diagnosis not present

## 2019-07-16 DIAGNOSIS — N183 Chronic kidney disease, stage 3 unspecified: Secondary | ICD-10-CM | POA: Diagnosis not present

## 2019-07-16 DIAGNOSIS — G8114 Spastic hemiplegia affecting left nondominant side: Secondary | ICD-10-CM | POA: Diagnosis not present

## 2019-07-16 DIAGNOSIS — M1712 Unilateral primary osteoarthritis, left knee: Secondary | ICD-10-CM | POA: Diagnosis not present

## 2019-07-16 DIAGNOSIS — D631 Anemia in chronic kidney disease: Secondary | ICD-10-CM | POA: Diagnosis not present

## 2019-07-16 DIAGNOSIS — I10 Essential (primary) hypertension: Secondary | ICD-10-CM | POA: Diagnosis not present

## 2019-07-16 DIAGNOSIS — D500 Iron deficiency anemia secondary to blood loss (chronic): Secondary | ICD-10-CM | POA: Diagnosis not present

## 2019-07-24 DIAGNOSIS — R2689 Other abnormalities of gait and mobility: Secondary | ICD-10-CM | POA: Diagnosis not present

## 2019-07-24 DIAGNOSIS — M1712 Unilateral primary osteoarthritis, left knee: Secondary | ICD-10-CM | POA: Diagnosis not present

## 2019-07-24 DIAGNOSIS — I6359 Cerebral infarction due to unspecified occlusion or stenosis of other cerebral artery: Secondary | ICD-10-CM | POA: Diagnosis not present

## 2019-07-24 DIAGNOSIS — M6281 Muscle weakness (generalized): Secondary | ICD-10-CM | POA: Diagnosis not present

## 2019-07-24 DIAGNOSIS — I69391 Dysphagia following cerebral infarction: Secondary | ICD-10-CM | POA: Diagnosis not present

## 2019-07-24 DIAGNOSIS — G8114 Spastic hemiplegia affecting left nondominant side: Secondary | ICD-10-CM | POA: Diagnosis not present

## 2019-08-13 DIAGNOSIS — D509 Iron deficiency anemia, unspecified: Secondary | ICD-10-CM | POA: Diagnosis not present

## 2019-08-13 DIAGNOSIS — K219 Gastro-esophageal reflux disease without esophagitis: Secondary | ICD-10-CM | POA: Diagnosis not present

## 2019-08-13 DIAGNOSIS — D529 Folate deficiency anemia, unspecified: Secondary | ICD-10-CM | POA: Diagnosis not present

## 2019-08-13 DIAGNOSIS — E1122 Type 2 diabetes mellitus with diabetic chronic kidney disease: Secondary | ICD-10-CM | POA: Diagnosis not present

## 2019-08-13 DIAGNOSIS — I10 Essential (primary) hypertension: Secondary | ICD-10-CM | POA: Diagnosis not present

## 2019-08-13 DIAGNOSIS — R946 Abnormal results of thyroid function studies: Secondary | ICD-10-CM | POA: Diagnosis not present

## 2019-08-13 DIAGNOSIS — E782 Mixed hyperlipidemia: Secondary | ICD-10-CM | POA: Diagnosis not present

## 2019-08-13 DIAGNOSIS — F17210 Nicotine dependence, cigarettes, uncomplicated: Secondary | ICD-10-CM | POA: Diagnosis not present

## 2019-08-13 DIAGNOSIS — N183 Chronic kidney disease, stage 3 unspecified: Secondary | ICD-10-CM | POA: Diagnosis not present

## 2019-08-20 DIAGNOSIS — M1712 Unilateral primary osteoarthritis, left knee: Secondary | ICD-10-CM | POA: Diagnosis not present

## 2019-08-20 DIAGNOSIS — E1122 Type 2 diabetes mellitus with diabetic chronic kidney disease: Secondary | ICD-10-CM | POA: Diagnosis not present

## 2019-08-20 DIAGNOSIS — I10 Essential (primary) hypertension: Secondary | ICD-10-CM | POA: Diagnosis not present

## 2019-08-20 DIAGNOSIS — E782 Mixed hyperlipidemia: Secondary | ICD-10-CM | POA: Diagnosis not present

## 2019-08-20 DIAGNOSIS — F331 Major depressive disorder, recurrent, moderate: Secondary | ICD-10-CM | POA: Diagnosis not present

## 2019-08-20 DIAGNOSIS — G8114 Spastic hemiplegia affecting left nondominant side: Secondary | ICD-10-CM | POA: Diagnosis not present

## 2019-08-20 DIAGNOSIS — F0150 Vascular dementia without behavioral disturbance: Secondary | ICD-10-CM | POA: Diagnosis not present

## 2019-08-20 DIAGNOSIS — D509 Iron deficiency anemia, unspecified: Secondary | ICD-10-CM | POA: Diagnosis not present

## 2019-08-21 DIAGNOSIS — M6281 Muscle weakness (generalized): Secondary | ICD-10-CM | POA: Diagnosis not present

## 2019-08-21 DIAGNOSIS — I6359 Cerebral infarction due to unspecified occlusion or stenosis of other cerebral artery: Secondary | ICD-10-CM | POA: Diagnosis not present

## 2019-08-21 DIAGNOSIS — R2689 Other abnormalities of gait and mobility: Secondary | ICD-10-CM | POA: Diagnosis not present

## 2019-08-21 DIAGNOSIS — M1712 Unilateral primary osteoarthritis, left knee: Secondary | ICD-10-CM | POA: Diagnosis not present

## 2019-08-21 DIAGNOSIS — G8114 Spastic hemiplegia affecting left nondominant side: Secondary | ICD-10-CM | POA: Diagnosis not present

## 2019-08-21 DIAGNOSIS — I69391 Dysphagia following cerebral infarction: Secondary | ICD-10-CM | POA: Diagnosis not present

## 2019-09-12 DIAGNOSIS — T17908A Unspecified foreign body in respiratory tract, part unspecified causing other injury, initial encounter: Secondary | ICD-10-CM | POA: Diagnosis not present

## 2019-09-12 DIAGNOSIS — Z20828 Contact with and (suspected) exposure to other viral communicable diseases: Secondary | ICD-10-CM | POA: Diagnosis not present

## 2019-09-12 DIAGNOSIS — J370 Chronic laryngitis: Secondary | ICD-10-CM | POA: Diagnosis not present

## 2019-09-12 DIAGNOSIS — R05 Cough: Secondary | ICD-10-CM | POA: Diagnosis not present

## 2019-09-15 DIAGNOSIS — E1122 Type 2 diabetes mellitus with diabetic chronic kidney disease: Secondary | ICD-10-CM | POA: Diagnosis not present

## 2019-09-15 DIAGNOSIS — R1310 Dysphagia, unspecified: Secondary | ICD-10-CM | POA: Diagnosis not present

## 2019-09-15 DIAGNOSIS — G8114 Spastic hemiplegia affecting left nondominant side: Secondary | ICD-10-CM | POA: Diagnosis not present

## 2019-09-15 DIAGNOSIS — D631 Anemia in chronic kidney disease: Secondary | ICD-10-CM | POA: Diagnosis not present

## 2019-09-15 DIAGNOSIS — D500 Iron deficiency anemia secondary to blood loss (chronic): Secondary | ICD-10-CM | POA: Diagnosis not present

## 2019-09-15 DIAGNOSIS — I10 Essential (primary) hypertension: Secondary | ICD-10-CM | POA: Diagnosis not present

## 2019-09-15 DIAGNOSIS — N183 Chronic kidney disease, stage 3 unspecified: Secondary | ICD-10-CM | POA: Diagnosis not present

## 2019-09-15 DIAGNOSIS — M1712 Unilateral primary osteoarthritis, left knee: Secondary | ICD-10-CM | POA: Diagnosis not present

## 2019-09-21 DIAGNOSIS — I6359 Cerebral infarction due to unspecified occlusion or stenosis of other cerebral artery: Secondary | ICD-10-CM | POA: Diagnosis not present

## 2019-09-21 DIAGNOSIS — M6281 Muscle weakness (generalized): Secondary | ICD-10-CM | POA: Diagnosis not present

## 2019-09-21 DIAGNOSIS — G8114 Spastic hemiplegia affecting left nondominant side: Secondary | ICD-10-CM | POA: Diagnosis not present

## 2019-09-21 DIAGNOSIS — R2689 Other abnormalities of gait and mobility: Secondary | ICD-10-CM | POA: Diagnosis not present

## 2019-09-21 DIAGNOSIS — M1712 Unilateral primary osteoarthritis, left knee: Secondary | ICD-10-CM | POA: Diagnosis not present

## 2019-09-21 DIAGNOSIS — I69391 Dysphagia following cerebral infarction: Secondary | ICD-10-CM | POA: Diagnosis not present

## 2019-10-09 DIAGNOSIS — R197 Diarrhea, unspecified: Secondary | ICD-10-CM | POA: Diagnosis not present

## 2019-10-21 DIAGNOSIS — I6359 Cerebral infarction due to unspecified occlusion or stenosis of other cerebral artery: Secondary | ICD-10-CM | POA: Diagnosis not present

## 2019-10-21 DIAGNOSIS — G8114 Spastic hemiplegia affecting left nondominant side: Secondary | ICD-10-CM | POA: Diagnosis not present

## 2019-10-21 DIAGNOSIS — M6281 Muscle weakness (generalized): Secondary | ICD-10-CM | POA: Diagnosis not present

## 2019-10-21 DIAGNOSIS — M1712 Unilateral primary osteoarthritis, left knee: Secondary | ICD-10-CM | POA: Diagnosis not present

## 2019-10-21 DIAGNOSIS — I69391 Dysphagia following cerebral infarction: Secondary | ICD-10-CM | POA: Diagnosis not present

## 2019-10-21 DIAGNOSIS — R2689 Other abnormalities of gait and mobility: Secondary | ICD-10-CM | POA: Diagnosis not present

## 2019-10-28 DIAGNOSIS — B373 Candidiasis of vulva and vagina: Secondary | ICD-10-CM | POA: Diagnosis not present

## 2019-11-04 DIAGNOSIS — F331 Major depressive disorder, recurrent, moderate: Secondary | ICD-10-CM | POA: Diagnosis not present

## 2019-11-21 DIAGNOSIS — I6359 Cerebral infarction due to unspecified occlusion or stenosis of other cerebral artery: Secondary | ICD-10-CM | POA: Diagnosis not present

## 2019-11-21 DIAGNOSIS — M6281 Muscle weakness (generalized): Secondary | ICD-10-CM | POA: Diagnosis not present

## 2019-11-21 DIAGNOSIS — M1712 Unilateral primary osteoarthritis, left knee: Secondary | ICD-10-CM | POA: Diagnosis not present

## 2019-11-21 DIAGNOSIS — G8114 Spastic hemiplegia affecting left nondominant side: Secondary | ICD-10-CM | POA: Diagnosis not present

## 2019-11-21 DIAGNOSIS — I69391 Dysphagia following cerebral infarction: Secondary | ICD-10-CM | POA: Diagnosis not present

## 2019-11-21 DIAGNOSIS — R2689 Other abnormalities of gait and mobility: Secondary | ICD-10-CM | POA: Diagnosis not present

## 2019-11-28 DIAGNOSIS — F0150 Vascular dementia without behavioral disturbance: Secondary | ICD-10-CM | POA: Diagnosis not present

## 2019-11-28 DIAGNOSIS — E1122 Type 2 diabetes mellitus with diabetic chronic kidney disease: Secondary | ICD-10-CM | POA: Diagnosis not present

## 2019-11-28 DIAGNOSIS — R946 Abnormal results of thyroid function studies: Secondary | ICD-10-CM | POA: Diagnosis not present

## 2019-11-28 DIAGNOSIS — Z0001 Encounter for general adult medical examination with abnormal findings: Secondary | ICD-10-CM | POA: Diagnosis not present

## 2019-11-28 DIAGNOSIS — I69391 Dysphagia following cerebral infarction: Secondary | ICD-10-CM | POA: Diagnosis not present

## 2019-11-28 DIAGNOSIS — E1165 Type 2 diabetes mellitus with hyperglycemia: Secondary | ICD-10-CM | POA: Diagnosis not present

## 2019-11-28 DIAGNOSIS — N183 Chronic kidney disease, stage 3 unspecified: Secondary | ICD-10-CM | POA: Diagnosis not present

## 2019-11-28 DIAGNOSIS — G8114 Spastic hemiplegia affecting left nondominant side: Secondary | ICD-10-CM | POA: Diagnosis not present

## 2019-11-28 DIAGNOSIS — F17210 Nicotine dependence, cigarettes, uncomplicated: Secondary | ICD-10-CM | POA: Diagnosis not present

## 2019-11-28 DIAGNOSIS — Z87891 Personal history of nicotine dependence: Secondary | ICD-10-CM | POA: Diagnosis not present

## 2019-11-28 DIAGNOSIS — E782 Mixed hyperlipidemia: Secondary | ICD-10-CM | POA: Diagnosis not present

## 2019-11-28 DIAGNOSIS — I10 Essential (primary) hypertension: Secondary | ICD-10-CM | POA: Diagnosis not present

## 2019-11-28 DIAGNOSIS — D649 Anemia, unspecified: Secondary | ICD-10-CM | POA: Diagnosis not present

## 2019-12-01 DIAGNOSIS — Z0000 Encounter for general adult medical examination without abnormal findings: Secondary | ICD-10-CM | POA: Diagnosis not present

## 2019-12-03 DIAGNOSIS — D509 Iron deficiency anemia, unspecified: Secondary | ICD-10-CM | POA: Diagnosis not present

## 2019-12-03 DIAGNOSIS — I10 Essential (primary) hypertension: Secondary | ICD-10-CM | POA: Diagnosis not present

## 2019-12-03 DIAGNOSIS — Z515 Encounter for palliative care: Secondary | ICD-10-CM | POA: Diagnosis not present

## 2019-12-03 DIAGNOSIS — Z0001 Encounter for general adult medical examination with abnormal findings: Secondary | ICD-10-CM | POA: Diagnosis not present

## 2019-12-03 DIAGNOSIS — F0150 Vascular dementia without behavioral disturbance: Secondary | ICD-10-CM | POA: Diagnosis not present

## 2019-12-03 DIAGNOSIS — R4582 Worries: Secondary | ICD-10-CM | POA: Diagnosis not present

## 2019-12-03 DIAGNOSIS — E782 Mixed hyperlipidemia: Secondary | ICD-10-CM | POA: Diagnosis not present

## 2019-12-03 DIAGNOSIS — E1122 Type 2 diabetes mellitus with diabetic chronic kidney disease: Secondary | ICD-10-CM | POA: Diagnosis not present

## 2019-12-07 DIAGNOSIS — D649 Anemia, unspecified: Secondary | ICD-10-CM | POA: Diagnosis not present

## 2019-12-07 DIAGNOSIS — N1831 Chronic kidney disease, stage 3a: Secondary | ICD-10-CM | POA: Diagnosis not present

## 2019-12-07 DIAGNOSIS — J690 Pneumonitis due to inhalation of food and vomit: Secondary | ICD-10-CM | POA: Diagnosis not present

## 2019-12-07 DIAGNOSIS — B9620 Unspecified Escherichia coli [E. coli] as the cause of diseases classified elsewhere: Secondary | ICD-10-CM | POA: Diagnosis not present

## 2019-12-07 DIAGNOSIS — Z20822 Contact with and (suspected) exposure to covid-19: Secondary | ICD-10-CM | POA: Diagnosis not present

## 2019-12-07 DIAGNOSIS — E1122 Type 2 diabetes mellitus with diabetic chronic kidney disease: Secondary | ICD-10-CM | POA: Diagnosis not present

## 2019-12-07 DIAGNOSIS — N390 Urinary tract infection, site not specified: Secondary | ICD-10-CM | POA: Diagnosis not present

## 2019-12-07 DIAGNOSIS — I129 Hypertensive chronic kidney disease with stage 1 through stage 4 chronic kidney disease, or unspecified chronic kidney disease: Secondary | ICD-10-CM | POA: Diagnosis not present

## 2019-12-07 DIAGNOSIS — R4182 Altered mental status, unspecified: Secondary | ICD-10-CM | POA: Diagnosis not present

## 2019-12-07 DIAGNOSIS — J9811 Atelectasis: Secondary | ICD-10-CM | POA: Diagnosis not present

## 2019-12-07 DIAGNOSIS — F0151 Vascular dementia with behavioral disturbance: Secondary | ICD-10-CM | POA: Diagnosis not present

## 2019-12-08 DIAGNOSIS — I129 Hypertensive chronic kidney disease with stage 1 through stage 4 chronic kidney disease, or unspecified chronic kidney disease: Secondary | ICD-10-CM | POA: Diagnosis not present

## 2019-12-08 DIAGNOSIS — B9620 Unspecified Escherichia coli [E. coli] as the cause of diseases classified elsewhere: Secondary | ICD-10-CM | POA: Diagnosis not present

## 2019-12-08 DIAGNOSIS — E1122 Type 2 diabetes mellitus with diabetic chronic kidney disease: Secondary | ICD-10-CM | POA: Diagnosis not present

## 2019-12-08 DIAGNOSIS — Z20822 Contact with and (suspected) exposure to covid-19: Secondary | ICD-10-CM | POA: Diagnosis not present

## 2019-12-08 DIAGNOSIS — N1831 Chronic kidney disease, stage 3a: Secondary | ICD-10-CM | POA: Diagnosis not present

## 2019-12-08 DIAGNOSIS — F0151 Vascular dementia with behavioral disturbance: Secondary | ICD-10-CM | POA: Diagnosis not present

## 2019-12-08 DIAGNOSIS — D500 Iron deficiency anemia secondary to blood loss (chronic): Secondary | ICD-10-CM | POA: Diagnosis not present

## 2019-12-08 DIAGNOSIS — N390 Urinary tract infection, site not specified: Secondary | ICD-10-CM | POA: Diagnosis not present

## 2019-12-08 DIAGNOSIS — D649 Anemia, unspecified: Secondary | ICD-10-CM | POA: Diagnosis not present

## 2019-12-08 DIAGNOSIS — F0281 Dementia in other diseases classified elsewhere with behavioral disturbance: Secondary | ICD-10-CM | POA: Diagnosis not present

## 2019-12-08 DIAGNOSIS — J690 Pneumonitis due to inhalation of food and vomit: Secondary | ICD-10-CM | POA: Diagnosis not present

## 2019-12-09 DIAGNOSIS — Z20822 Contact with and (suspected) exposure to covid-19: Secondary | ICD-10-CM | POA: Diagnosis not present

## 2019-12-09 DIAGNOSIS — N1831 Chronic kidney disease, stage 3a: Secondary | ICD-10-CM | POA: Diagnosis not present

## 2019-12-09 DIAGNOSIS — B9620 Unspecified Escherichia coli [E. coli] as the cause of diseases classified elsewhere: Secondary | ICD-10-CM | POA: Diagnosis not present

## 2019-12-09 DIAGNOSIS — D649 Anemia, unspecified: Secondary | ICD-10-CM | POA: Diagnosis not present

## 2019-12-09 DIAGNOSIS — F0151 Vascular dementia with behavioral disturbance: Secondary | ICD-10-CM | POA: Diagnosis not present

## 2019-12-09 DIAGNOSIS — N390 Urinary tract infection, site not specified: Secondary | ICD-10-CM | POA: Diagnosis not present

## 2019-12-09 DIAGNOSIS — E1122 Type 2 diabetes mellitus with diabetic chronic kidney disease: Secondary | ICD-10-CM | POA: Diagnosis not present

## 2019-12-09 DIAGNOSIS — J690 Pneumonitis due to inhalation of food and vomit: Secondary | ICD-10-CM | POA: Diagnosis not present

## 2019-12-09 DIAGNOSIS — D500 Iron deficiency anemia secondary to blood loss (chronic): Secondary | ICD-10-CM | POA: Diagnosis not present

## 2019-12-09 DIAGNOSIS — F0281 Dementia in other diseases classified elsewhere with behavioral disturbance: Secondary | ICD-10-CM | POA: Diagnosis not present

## 2019-12-09 DIAGNOSIS — I129 Hypertensive chronic kidney disease with stage 1 through stage 4 chronic kidney disease, or unspecified chronic kidney disease: Secondary | ICD-10-CM | POA: Diagnosis not present

## 2019-12-10 DIAGNOSIS — N390 Urinary tract infection, site not specified: Secondary | ICD-10-CM | POA: Diagnosis not present

## 2019-12-10 DIAGNOSIS — D649 Anemia, unspecified: Secondary | ICD-10-CM | POA: Diagnosis not present

## 2019-12-10 DIAGNOSIS — E1122 Type 2 diabetes mellitus with diabetic chronic kidney disease: Secondary | ICD-10-CM | POA: Diagnosis not present

## 2019-12-10 DIAGNOSIS — B9620 Unspecified Escherichia coli [E. coli] as the cause of diseases classified elsewhere: Secondary | ICD-10-CM | POA: Diagnosis not present

## 2019-12-10 DIAGNOSIS — Z20822 Contact with and (suspected) exposure to covid-19: Secondary | ICD-10-CM | POA: Diagnosis not present

## 2019-12-10 DIAGNOSIS — J690 Pneumonitis due to inhalation of food and vomit: Secondary | ICD-10-CM | POA: Diagnosis not present

## 2019-12-10 DIAGNOSIS — I129 Hypertensive chronic kidney disease with stage 1 through stage 4 chronic kidney disease, or unspecified chronic kidney disease: Secondary | ICD-10-CM | POA: Diagnosis not present

## 2019-12-10 DIAGNOSIS — F0151 Vascular dementia with behavioral disturbance: Secondary | ICD-10-CM | POA: Diagnosis not present

## 2019-12-10 DIAGNOSIS — D500 Iron deficiency anemia secondary to blood loss (chronic): Secondary | ICD-10-CM | POA: Diagnosis not present

## 2019-12-10 DIAGNOSIS — F0281 Dementia in other diseases classified elsewhere with behavioral disturbance: Secondary | ICD-10-CM | POA: Diagnosis not present

## 2019-12-10 DIAGNOSIS — N1831 Chronic kidney disease, stage 3a: Secondary | ICD-10-CM | POA: Diagnosis not present

## 2019-12-11 DIAGNOSIS — N1831 Chronic kidney disease, stage 3a: Secondary | ICD-10-CM | POA: Diagnosis not present

## 2019-12-11 DIAGNOSIS — E1122 Type 2 diabetes mellitus with diabetic chronic kidney disease: Secondary | ICD-10-CM | POA: Diagnosis not present

## 2019-12-11 DIAGNOSIS — J690 Pneumonitis due to inhalation of food and vomit: Secondary | ICD-10-CM | POA: Diagnosis not present

## 2019-12-11 DIAGNOSIS — F0151 Vascular dementia with behavioral disturbance: Secondary | ICD-10-CM | POA: Diagnosis not present

## 2019-12-11 DIAGNOSIS — I129 Hypertensive chronic kidney disease with stage 1 through stage 4 chronic kidney disease, or unspecified chronic kidney disease: Secondary | ICD-10-CM | POA: Diagnosis not present

## 2019-12-11 DIAGNOSIS — N390 Urinary tract infection, site not specified: Secondary | ICD-10-CM | POA: Diagnosis not present

## 2019-12-11 DIAGNOSIS — B9620 Unspecified Escherichia coli [E. coli] as the cause of diseases classified elsewhere: Secondary | ICD-10-CM | POA: Diagnosis not present

## 2019-12-11 DIAGNOSIS — D500 Iron deficiency anemia secondary to blood loss (chronic): Secondary | ICD-10-CM | POA: Diagnosis not present

## 2019-12-11 DIAGNOSIS — Z20822 Contact with and (suspected) exposure to covid-19: Secondary | ICD-10-CM | POA: Diagnosis not present

## 2019-12-11 DIAGNOSIS — D649 Anemia, unspecified: Secondary | ICD-10-CM | POA: Diagnosis not present

## 2019-12-11 DIAGNOSIS — F0281 Dementia in other diseases classified elsewhere with behavioral disturbance: Secondary | ICD-10-CM | POA: Diagnosis not present

## 2019-12-12 DIAGNOSIS — E1122 Type 2 diabetes mellitus with diabetic chronic kidney disease: Secondary | ICD-10-CM | POA: Diagnosis not present

## 2019-12-12 DIAGNOSIS — F0281 Dementia in other diseases classified elsewhere with behavioral disturbance: Secondary | ICD-10-CM | POA: Diagnosis not present

## 2019-12-12 DIAGNOSIS — I129 Hypertensive chronic kidney disease with stage 1 through stage 4 chronic kidney disease, or unspecified chronic kidney disease: Secondary | ICD-10-CM | POA: Diagnosis not present

## 2019-12-12 DIAGNOSIS — D649 Anemia, unspecified: Secondary | ICD-10-CM | POA: Diagnosis not present

## 2019-12-12 DIAGNOSIS — F0151 Vascular dementia with behavioral disturbance: Secondary | ICD-10-CM | POA: Diagnosis not present

## 2019-12-12 DIAGNOSIS — J690 Pneumonitis due to inhalation of food and vomit: Secondary | ICD-10-CM | POA: Diagnosis not present

## 2019-12-12 DIAGNOSIS — B9620 Unspecified Escherichia coli [E. coli] as the cause of diseases classified elsewhere: Secondary | ICD-10-CM | POA: Diagnosis not present

## 2019-12-12 DIAGNOSIS — N390 Urinary tract infection, site not specified: Secondary | ICD-10-CM | POA: Diagnosis not present

## 2019-12-12 DIAGNOSIS — Z20822 Contact with and (suspected) exposure to covid-19: Secondary | ICD-10-CM | POA: Diagnosis not present

## 2019-12-12 DIAGNOSIS — D500 Iron deficiency anemia secondary to blood loss (chronic): Secondary | ICD-10-CM | POA: Diagnosis not present

## 2019-12-12 DIAGNOSIS — N1831 Chronic kidney disease, stage 3a: Secondary | ICD-10-CM | POA: Diagnosis not present

## 2019-12-13 DIAGNOSIS — N390 Urinary tract infection, site not specified: Secondary | ICD-10-CM | POA: Diagnosis not present

## 2019-12-13 DIAGNOSIS — D649 Anemia, unspecified: Secondary | ICD-10-CM | POA: Diagnosis not present

## 2019-12-13 DIAGNOSIS — F0281 Dementia in other diseases classified elsewhere with behavioral disturbance: Secondary | ICD-10-CM | POA: Diagnosis not present

## 2019-12-13 DIAGNOSIS — Z20822 Contact with and (suspected) exposure to covid-19: Secondary | ICD-10-CM | POA: Diagnosis not present

## 2019-12-13 DIAGNOSIS — J690 Pneumonitis due to inhalation of food and vomit: Secondary | ICD-10-CM | POA: Diagnosis not present

## 2019-12-13 DIAGNOSIS — E1122 Type 2 diabetes mellitus with diabetic chronic kidney disease: Secondary | ICD-10-CM | POA: Diagnosis not present

## 2019-12-13 DIAGNOSIS — F0151 Vascular dementia with behavioral disturbance: Secondary | ICD-10-CM | POA: Diagnosis not present

## 2019-12-13 DIAGNOSIS — N1831 Chronic kidney disease, stage 3a: Secondary | ICD-10-CM | POA: Diagnosis not present

## 2019-12-13 DIAGNOSIS — I129 Hypertensive chronic kidney disease with stage 1 through stage 4 chronic kidney disease, or unspecified chronic kidney disease: Secondary | ICD-10-CM | POA: Diagnosis not present

## 2019-12-13 DIAGNOSIS — B9620 Unspecified Escherichia coli [E. coli] as the cause of diseases classified elsewhere: Secondary | ICD-10-CM | POA: Diagnosis not present

## 2019-12-13 DIAGNOSIS — D500 Iron deficiency anemia secondary to blood loss (chronic): Secondary | ICD-10-CM | POA: Diagnosis not present

## 2019-12-14 DIAGNOSIS — F0151 Vascular dementia with behavioral disturbance: Secondary | ICD-10-CM | POA: Diagnosis not present

## 2019-12-14 DIAGNOSIS — F0281 Dementia in other diseases classified elsewhere with behavioral disturbance: Secondary | ICD-10-CM | POA: Diagnosis not present

## 2019-12-14 DIAGNOSIS — D649 Anemia, unspecified: Secondary | ICD-10-CM | POA: Diagnosis not present

## 2019-12-14 DIAGNOSIS — N1831 Chronic kidney disease, stage 3a: Secondary | ICD-10-CM | POA: Diagnosis not present

## 2019-12-14 DIAGNOSIS — E1122 Type 2 diabetes mellitus with diabetic chronic kidney disease: Secondary | ICD-10-CM | POA: Diagnosis not present

## 2019-12-14 DIAGNOSIS — D500 Iron deficiency anemia secondary to blood loss (chronic): Secondary | ICD-10-CM | POA: Diagnosis not present

## 2019-12-14 DIAGNOSIS — I129 Hypertensive chronic kidney disease with stage 1 through stage 4 chronic kidney disease, or unspecified chronic kidney disease: Secondary | ICD-10-CM | POA: Diagnosis not present

## 2019-12-14 DIAGNOSIS — N390 Urinary tract infection, site not specified: Secondary | ICD-10-CM | POA: Diagnosis not present

## 2019-12-14 DIAGNOSIS — J690 Pneumonitis due to inhalation of food and vomit: Secondary | ICD-10-CM | POA: Diagnosis not present

## 2019-12-14 DIAGNOSIS — Z20822 Contact with and (suspected) exposure to covid-19: Secondary | ICD-10-CM | POA: Diagnosis not present

## 2019-12-14 DIAGNOSIS — B9620 Unspecified Escherichia coli [E. coli] as the cause of diseases classified elsewhere: Secondary | ICD-10-CM | POA: Diagnosis not present

## 2019-12-15 DIAGNOSIS — D649 Anemia, unspecified: Secondary | ICD-10-CM | POA: Diagnosis not present

## 2019-12-15 DIAGNOSIS — F0151 Vascular dementia with behavioral disturbance: Secondary | ICD-10-CM | POA: Diagnosis not present

## 2019-12-15 DIAGNOSIS — B9620 Unspecified Escherichia coli [E. coli] as the cause of diseases classified elsewhere: Secondary | ICD-10-CM | POA: Diagnosis not present

## 2019-12-15 DIAGNOSIS — E1122 Type 2 diabetes mellitus with diabetic chronic kidney disease: Secondary | ICD-10-CM | POA: Diagnosis not present

## 2019-12-15 DIAGNOSIS — I129 Hypertensive chronic kidney disease with stage 1 through stage 4 chronic kidney disease, or unspecified chronic kidney disease: Secondary | ICD-10-CM | POA: Diagnosis not present

## 2019-12-15 DIAGNOSIS — J690 Pneumonitis due to inhalation of food and vomit: Secondary | ICD-10-CM | POA: Diagnosis not present

## 2019-12-15 DIAGNOSIS — N390 Urinary tract infection, site not specified: Secondary | ICD-10-CM | POA: Diagnosis not present

## 2019-12-15 DIAGNOSIS — F0281 Dementia in other diseases classified elsewhere with behavioral disturbance: Secondary | ICD-10-CM | POA: Diagnosis not present

## 2019-12-15 DIAGNOSIS — N1831 Chronic kidney disease, stage 3a: Secondary | ICD-10-CM | POA: Diagnosis not present

## 2019-12-15 DIAGNOSIS — D500 Iron deficiency anemia secondary to blood loss (chronic): Secondary | ICD-10-CM | POA: Diagnosis not present

## 2019-12-15 DIAGNOSIS — Z20822 Contact with and (suspected) exposure to covid-19: Secondary | ICD-10-CM | POA: Diagnosis not present

## 2019-12-16 DIAGNOSIS — R402411 Glasgow coma scale score 13-15, in the field [EMT or ambulance]: Secondary | ICD-10-CM | POA: Diagnosis not present

## 2019-12-16 DIAGNOSIS — D649 Anemia, unspecified: Secondary | ICD-10-CM | POA: Diagnosis not present

## 2019-12-16 DIAGNOSIS — I129 Hypertensive chronic kidney disease with stage 1 through stage 4 chronic kidney disease, or unspecified chronic kidney disease: Secondary | ICD-10-CM | POA: Diagnosis not present

## 2019-12-16 DIAGNOSIS — B9620 Unspecified Escherichia coli [E. coli] as the cause of diseases classified elsewhere: Secondary | ICD-10-CM | POA: Diagnosis not present

## 2019-12-16 DIAGNOSIS — E1122 Type 2 diabetes mellitus with diabetic chronic kidney disease: Secondary | ICD-10-CM | POA: Diagnosis not present

## 2019-12-16 DIAGNOSIS — D500 Iron deficiency anemia secondary to blood loss (chronic): Secondary | ICD-10-CM | POA: Diagnosis not present

## 2019-12-16 DIAGNOSIS — F0281 Dementia in other diseases classified elsewhere with behavioral disturbance: Secondary | ICD-10-CM | POA: Diagnosis not present

## 2019-12-16 DIAGNOSIS — N1831 Chronic kidney disease, stage 3a: Secondary | ICD-10-CM | POA: Diagnosis not present

## 2019-12-16 DIAGNOSIS — Z20822 Contact with and (suspected) exposure to covid-19: Secondary | ICD-10-CM | POA: Diagnosis not present

## 2019-12-16 DIAGNOSIS — J690 Pneumonitis due to inhalation of food and vomit: Secondary | ICD-10-CM | POA: Diagnosis not present

## 2019-12-16 DIAGNOSIS — F0151 Vascular dementia with behavioral disturbance: Secondary | ICD-10-CM | POA: Diagnosis not present

## 2019-12-16 DIAGNOSIS — N390 Urinary tract infection, site not specified: Secondary | ICD-10-CM | POA: Diagnosis not present

## 2019-12-16 DIAGNOSIS — Z7401 Bed confinement status: Secondary | ICD-10-CM | POA: Diagnosis not present

## 2019-12-16 DIAGNOSIS — R404 Transient alteration of awareness: Secondary | ICD-10-CM | POA: Diagnosis not present

## 2019-12-16 DIAGNOSIS — R29898 Other symptoms and signs involving the musculoskeletal system: Secondary | ICD-10-CM | POA: Diagnosis not present

## 2019-12-21 DIAGNOSIS — G8114 Spastic hemiplegia affecting left nondominant side: Secondary | ICD-10-CM | POA: Diagnosis not present

## 2019-12-21 DIAGNOSIS — R2689 Other abnormalities of gait and mobility: Secondary | ICD-10-CM | POA: Diagnosis not present

## 2019-12-21 DIAGNOSIS — M1712 Unilateral primary osteoarthritis, left knee: Secondary | ICD-10-CM | POA: Diagnosis not present

## 2019-12-21 DIAGNOSIS — M6281 Muscle weakness (generalized): Secondary | ICD-10-CM | POA: Diagnosis not present

## 2019-12-21 DIAGNOSIS — I69391 Dysphagia following cerebral infarction: Secondary | ICD-10-CM | POA: Diagnosis not present

## 2019-12-21 DIAGNOSIS — I6359 Cerebral infarction due to unspecified occlusion or stenosis of other cerebral artery: Secondary | ICD-10-CM | POA: Diagnosis not present

## 2019-12-23 DIAGNOSIS — F0151 Vascular dementia with behavioral disturbance: Secondary | ICD-10-CM | POA: Diagnosis not present

## 2019-12-23 DIAGNOSIS — R1310 Dysphagia, unspecified: Secondary | ICD-10-CM | POA: Diagnosis not present

## 2019-12-23 DIAGNOSIS — I10 Essential (primary) hypertension: Secondary | ICD-10-CM | POA: Diagnosis not present

## 2019-12-23 DIAGNOSIS — E1122 Type 2 diabetes mellitus with diabetic chronic kidney disease: Secondary | ICD-10-CM | POA: Diagnosis not present

## 2019-12-23 DIAGNOSIS — E782 Mixed hyperlipidemia: Secondary | ICD-10-CM | POA: Diagnosis not present

## 2019-12-23 DIAGNOSIS — F17210 Nicotine dependence, cigarettes, uncomplicated: Secondary | ICD-10-CM | POA: Diagnosis not present

## 2019-12-23 DIAGNOSIS — F3175 Bipolar disorder, in partial remission, most recent episode depressed: Secondary | ICD-10-CM | POA: Diagnosis not present

## 2019-12-23 DIAGNOSIS — K219 Gastro-esophageal reflux disease without esophagitis: Secondary | ICD-10-CM | POA: Diagnosis not present

## 2019-12-23 DIAGNOSIS — N1831 Chronic kidney disease, stage 3a: Secondary | ICD-10-CM | POA: Diagnosis not present

## 2019-12-24 DIAGNOSIS — F17210 Nicotine dependence, cigarettes, uncomplicated: Secondary | ICD-10-CM | POA: Diagnosis not present

## 2019-12-24 DIAGNOSIS — E782 Mixed hyperlipidemia: Secondary | ICD-10-CM | POA: Diagnosis not present

## 2019-12-24 DIAGNOSIS — R1310 Dysphagia, unspecified: Secondary | ICD-10-CM | POA: Diagnosis not present

## 2019-12-24 DIAGNOSIS — N1831 Chronic kidney disease, stage 3a: Secondary | ICD-10-CM | POA: Diagnosis not present

## 2019-12-24 DIAGNOSIS — K219 Gastro-esophageal reflux disease without esophagitis: Secondary | ICD-10-CM | POA: Diagnosis not present

## 2019-12-24 DIAGNOSIS — F0151 Vascular dementia with behavioral disturbance: Secondary | ICD-10-CM | POA: Diagnosis not present

## 2019-12-24 DIAGNOSIS — F3175 Bipolar disorder, in partial remission, most recent episode depressed: Secondary | ICD-10-CM | POA: Diagnosis not present

## 2019-12-24 DIAGNOSIS — I10 Essential (primary) hypertension: Secondary | ICD-10-CM | POA: Diagnosis not present

## 2019-12-24 DIAGNOSIS — E1122 Type 2 diabetes mellitus with diabetic chronic kidney disease: Secondary | ICD-10-CM | POA: Diagnosis not present

## 2019-12-25 DIAGNOSIS — N1831 Chronic kidney disease, stage 3a: Secondary | ICD-10-CM | POA: Diagnosis not present

## 2019-12-25 DIAGNOSIS — K219 Gastro-esophageal reflux disease without esophagitis: Secondary | ICD-10-CM | POA: Diagnosis not present

## 2019-12-25 DIAGNOSIS — R1310 Dysphagia, unspecified: Secondary | ICD-10-CM | POA: Diagnosis not present

## 2019-12-25 DIAGNOSIS — F0151 Vascular dementia with behavioral disturbance: Secondary | ICD-10-CM | POA: Diagnosis not present

## 2019-12-25 DIAGNOSIS — E782 Mixed hyperlipidemia: Secondary | ICD-10-CM | POA: Diagnosis not present

## 2019-12-25 DIAGNOSIS — F17210 Nicotine dependence, cigarettes, uncomplicated: Secondary | ICD-10-CM | POA: Diagnosis not present

## 2019-12-25 DIAGNOSIS — E1122 Type 2 diabetes mellitus with diabetic chronic kidney disease: Secondary | ICD-10-CM | POA: Diagnosis not present

## 2019-12-25 DIAGNOSIS — I10 Essential (primary) hypertension: Secondary | ICD-10-CM | POA: Diagnosis not present

## 2019-12-25 DIAGNOSIS — F3175 Bipolar disorder, in partial remission, most recent episode depressed: Secondary | ICD-10-CM | POA: Diagnosis not present

## 2019-12-31 DIAGNOSIS — R609 Edema, unspecified: Secondary | ICD-10-CM | POA: Diagnosis not present

## 2019-12-31 DIAGNOSIS — J181 Lobar pneumonia, unspecified organism: Secondary | ICD-10-CM | POA: Diagnosis not present

## 2019-12-31 DIAGNOSIS — N3000 Acute cystitis without hematuria: Secondary | ICD-10-CM | POA: Diagnosis not present

## 2019-12-31 DIAGNOSIS — E782 Mixed hyperlipidemia: Secondary | ICD-10-CM | POA: Diagnosis not present

## 2019-12-31 DIAGNOSIS — Z20822 Contact with and (suspected) exposure to covid-19: Secondary | ICD-10-CM | POA: Diagnosis not present

## 2019-12-31 DIAGNOSIS — R1310 Dysphagia, unspecified: Secondary | ICD-10-CM | POA: Diagnosis not present

## 2019-12-31 DIAGNOSIS — K219 Gastro-esophageal reflux disease without esophagitis: Secondary | ICD-10-CM | POA: Diagnosis not present

## 2019-12-31 DIAGNOSIS — F3175 Bipolar disorder, in partial remission, most recent episode depressed: Secondary | ICD-10-CM | POA: Diagnosis not present

## 2019-12-31 DIAGNOSIS — R0602 Shortness of breath: Secondary | ICD-10-CM | POA: Diagnosis not present

## 2019-12-31 DIAGNOSIS — M47814 Spondylosis without myelopathy or radiculopathy, thoracic region: Secondary | ICD-10-CM | POA: Diagnosis not present

## 2019-12-31 DIAGNOSIS — R531 Weakness: Secondary | ICD-10-CM | POA: Diagnosis not present

## 2019-12-31 DIAGNOSIS — F0151 Vascular dementia with behavioral disturbance: Secondary | ICD-10-CM | POA: Diagnosis not present

## 2019-12-31 DIAGNOSIS — R2990 Unspecified symptoms and signs involving the nervous system: Secondary | ICD-10-CM | POA: Diagnosis not present

## 2019-12-31 DIAGNOSIS — Z794 Long term (current) use of insulin: Secondary | ICD-10-CM | POA: Diagnosis not present

## 2019-12-31 DIAGNOSIS — E1165 Type 2 diabetes mellitus with hyperglycemia: Secondary | ICD-10-CM | POA: Diagnosis not present

## 2019-12-31 DIAGNOSIS — G319 Degenerative disease of nervous system, unspecified: Secondary | ICD-10-CM | POA: Diagnosis not present

## 2019-12-31 DIAGNOSIS — J189 Pneumonia, unspecified organism: Secondary | ICD-10-CM | POA: Diagnosis not present

## 2019-12-31 DIAGNOSIS — I2510 Atherosclerotic heart disease of native coronary artery without angina pectoris: Secondary | ICD-10-CM | POA: Diagnosis not present

## 2019-12-31 DIAGNOSIS — I10 Essential (primary) hypertension: Secondary | ICD-10-CM | POA: Diagnosis not present

## 2019-12-31 DIAGNOSIS — N1831 Chronic kidney disease, stage 3a: Secondary | ICD-10-CM | POA: Diagnosis not present

## 2019-12-31 DIAGNOSIS — F17210 Nicotine dependence, cigarettes, uncomplicated: Secondary | ICD-10-CM | POA: Diagnosis not present

## 2019-12-31 DIAGNOSIS — E1122 Type 2 diabetes mellitus with diabetic chronic kidney disease: Secondary | ICD-10-CM | POA: Diagnosis not present

## 2019-12-31 DIAGNOSIS — J320 Chronic maxillary sinusitis: Secondary | ICD-10-CM | POA: Diagnosis not present

## 2019-12-31 DIAGNOSIS — I700 Atherosclerosis of aorta: Secondary | ICD-10-CM | POA: Diagnosis not present

## 2019-12-31 DIAGNOSIS — I6782 Cerebral ischemia: Secondary | ICD-10-CM | POA: Diagnosis not present

## 2019-12-31 DIAGNOSIS — R000 Tachycardia, unspecified: Secondary | ICD-10-CM | POA: Diagnosis not present

## 2019-12-31 DIAGNOSIS — R569 Unspecified convulsions: Secondary | ICD-10-CM | POA: Diagnosis not present

## 2019-12-31 DIAGNOSIS — Z8673 Personal history of transient ischemic attack (TIA), and cerebral infarction without residual deficits: Secondary | ICD-10-CM | POA: Diagnosis not present

## 2019-12-31 DIAGNOSIS — R404 Transient alteration of awareness: Secondary | ICD-10-CM | POA: Diagnosis not present

## 2020-01-01 DIAGNOSIS — R0602 Shortness of breath: Secondary | ICD-10-CM | POA: Diagnosis not present

## 2020-01-02 DIAGNOSIS — R569 Unspecified convulsions: Secondary | ICD-10-CM | POA: Diagnosis not present

## 2020-01-02 DIAGNOSIS — I2510 Atherosclerotic heart disease of native coronary artery without angina pectoris: Secondary | ICD-10-CM | POA: Diagnosis not present

## 2020-01-02 DIAGNOSIS — I700 Atherosclerosis of aorta: Secondary | ICD-10-CM | POA: Diagnosis not present

## 2020-01-02 DIAGNOSIS — J181 Lobar pneumonia, unspecified organism: Secondary | ICD-10-CM | POA: Diagnosis not present

## 2020-01-02 DIAGNOSIS — M47814 Spondylosis without myelopathy or radiculopathy, thoracic region: Secondary | ICD-10-CM | POA: Diagnosis not present

## 2020-01-04 DEATH — deceased

## 2020-01-11 DIAGNOSIS — Z515 Encounter for palliative care: Secondary | ICD-10-CM | POA: Diagnosis not present

## 2020-01-11 DIAGNOSIS — J690 Pneumonitis due to inhalation of food and vomit: Secondary | ICD-10-CM | POA: Diagnosis not present

## 2020-01-11 DIAGNOSIS — B373 Candidiasis of vulva and vagina: Secondary | ICD-10-CM | POA: Diagnosis not present

## 2020-01-11 DIAGNOSIS — E1122 Type 2 diabetes mellitus with diabetic chronic kidney disease: Secondary | ICD-10-CM | POA: Diagnosis not present
